# Patient Record
Sex: Male | Born: 1950 | Race: White | Hispanic: No | Marital: Married | Smoking: Never smoker
Health system: Southern US, Community
[De-identification: ages and names within clinical notes are randomized; demographics above are authoritative.]

## PROBLEM LIST (undated history)

## (undated) DIAGNOSIS — N419 Inflammatory disease of prostate, unspecified: Secondary | ICD-10-CM

## (undated) DIAGNOSIS — G4733 Obstructive sleep apnea (adult) (pediatric): Secondary | ICD-10-CM

## (undated) DIAGNOSIS — Z8669 Personal history of other diseases of the nervous system and sense organs: Secondary | ICD-10-CM

## (undated) DIAGNOSIS — D6859 Other primary thrombophilia: Secondary | ICD-10-CM

## (undated) DIAGNOSIS — L089 Local infection of the skin and subcutaneous tissue, unspecified: Secondary | ICD-10-CM

## (undated) DIAGNOSIS — M4810 Ankylosing hyperostosis [Forestier], site unspecified: Secondary | ICD-10-CM

## (undated) DIAGNOSIS — N400 Enlarged prostate without lower urinary tract symptoms: Secondary | ICD-10-CM

## (undated) DIAGNOSIS — I2699 Other pulmonary embolism without acute cor pulmonale: Secondary | ICD-10-CM

## (undated) DIAGNOSIS — D509 Iron deficiency anemia, unspecified: Secondary | ICD-10-CM

## (undated) DIAGNOSIS — R519 Headache, unspecified: Secondary | ICD-10-CM

## (undated) DIAGNOSIS — H9190 Unspecified hearing loss, unspecified ear: Secondary | ICD-10-CM

## (undated) DIAGNOSIS — N529 Male erectile dysfunction, unspecified: Secondary | ICD-10-CM

## (undated) DIAGNOSIS — I10 Essential (primary) hypertension: Secondary | ICD-10-CM

## (undated) DIAGNOSIS — R399 Unspecified symptoms and signs involving the genitourinary system: Secondary | ICD-10-CM

## (undated) DIAGNOSIS — T8859XA Other complications of anesthesia, initial encounter: Secondary | ICD-10-CM

## (undated) DIAGNOSIS — R51 Headache: Secondary | ICD-10-CM

## (undated) DIAGNOSIS — N3943 Post-void dribbling: Secondary | ICD-10-CM

## (undated) DIAGNOSIS — E291 Testicular hypofunction: Secondary | ICD-10-CM

## (undated) DIAGNOSIS — J40 Bronchitis, not specified as acute or chronic: Secondary | ICD-10-CM

## (undated) DIAGNOSIS — N3000 Acute cystitis without hematuria: Secondary | ICD-10-CM

## (undated) DIAGNOSIS — G5600 Carpal tunnel syndrome, unspecified upper limb: Secondary | ICD-10-CM

## (undated) DIAGNOSIS — R42 Dizziness and giddiness: Secondary | ICD-10-CM

## (undated) DIAGNOSIS — M48061 Spinal stenosis, lumbar region without neurogenic claudication: Secondary | ICD-10-CM

## (undated) DIAGNOSIS — M5136 Other intervertebral disc degeneration, lumbar region: Secondary | ICD-10-CM

## (undated) DIAGNOSIS — K219 Gastro-esophageal reflux disease without esophagitis: Secondary | ICD-10-CM

## (undated) DIAGNOSIS — E119 Type 2 diabetes mellitus without complications: Secondary | ICD-10-CM

## (undated) DIAGNOSIS — R0600 Dyspnea, unspecified: Secondary | ICD-10-CM

## (undated) DIAGNOSIS — D6851 Activated protein C resistance: Secondary | ICD-10-CM

## (undated) DIAGNOSIS — J309 Allergic rhinitis, unspecified: Secondary | ICD-10-CM

## (undated) DIAGNOSIS — Z86711 Personal history of pulmonary embolism: Secondary | ICD-10-CM

## (undated) DIAGNOSIS — G4730 Sleep apnea, unspecified: Secondary | ICD-10-CM

## (undated) DIAGNOSIS — G5710 Meralgia paresthetica, unspecified lower limb: Secondary | ICD-10-CM

## (undated) DIAGNOSIS — M542 Cervicalgia: Secondary | ICD-10-CM

## (undated) DIAGNOSIS — I509 Heart failure, unspecified: Secondary | ICD-10-CM

## (undated) DIAGNOSIS — M109 Gout, unspecified: Secondary | ICD-10-CM

## (undated) DIAGNOSIS — H905 Unspecified sensorineural hearing loss: Secondary | ICD-10-CM

## (undated) DIAGNOSIS — M51369 Other intervertebral disc degeneration, lumbar region without mention of lumbar back pain or lower extremity pain: Secondary | ICD-10-CM

## (undated) DIAGNOSIS — Z86718 Personal history of other venous thrombosis and embolism: Secondary | ICD-10-CM

## (undated) DIAGNOSIS — E7800 Pure hypercholesterolemia, unspecified: Secondary | ICD-10-CM

## (undated) DIAGNOSIS — T4145XA Adverse effect of unspecified anesthetic, initial encounter: Secondary | ICD-10-CM

## (undated) DIAGNOSIS — E669 Obesity, unspecified: Secondary | ICD-10-CM

## (undated) HISTORY — DX: Benign prostatic hyperplasia without lower urinary tract symptoms: N40.0

## (undated) HISTORY — DX: Personal history of other venous thrombosis and embolism: Z86.718

## (undated) HISTORY — DX: Post-void dribbling: N39.43

## (undated) HISTORY — DX: Inflammatory disease of prostate, unspecified: N41.9

## (undated) HISTORY — DX: Testicular hypofunction: E29.1

## (undated) HISTORY — PX: FOOT SURGERY: SHX648

## (undated) HISTORY — PX: TUMOR EXCISION: SHX421

## (undated) HISTORY — DX: Type 2 diabetes mellitus without complications: E11.9

## (undated) HISTORY — DX: Obesity, unspecified: E66.9

## (undated) HISTORY — DX: Male erectile dysfunction, unspecified: N52.9

## (undated) HISTORY — PX: JOINT REPLACEMENT: SHX530

## (undated) HISTORY — PX: INNER EAR SURGERY: SHX679

## (undated) HISTORY — PX: PROSTATE SURGERY: SHX751

## (undated) HISTORY — DX: Acute cystitis without hematuria: N30.00

## (undated) HISTORY — PX: OTHER SURGICAL HISTORY: SHX169

## (undated) HISTORY — DX: Local infection of the skin and subcutaneous tissue, unspecified: L08.9

## (undated) HISTORY — DX: Unspecified symptoms and signs involving the genitourinary system: R39.9

---
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
## 1972-05-10 HISTORY — PX: LEG SURGERY: SHX1003

## 1977-05-10 HISTORY — PX: VASECTOMY: SHX75

## 1982-05-10 HISTORY — PX: HEMORRHOID SURGERY: SHX153

## 1983-05-11 HISTORY — PX: HYDROCELE EXCISION / REPAIR: SUR1145

## 1983-05-11 HISTORY — PX: HERNIA REPAIR: SHX51

## 2001-05-10 HISTORY — PX: FOOT SURGERY: SHX648

## 2004-09-28 ENCOUNTER — Ambulatory Visit: Payer: Self-pay | Admitting: Internal Medicine

## 2005-02-11 ENCOUNTER — Ambulatory Visit: Payer: Self-pay | Admitting: Ophthalmology

## 2005-07-05 ENCOUNTER — Ambulatory Visit: Payer: Self-pay | Admitting: Gastroenterology

## 2005-07-12 ENCOUNTER — Ambulatory Visit: Payer: Self-pay | Admitting: Gastroenterology

## 2006-07-19 ENCOUNTER — Ambulatory Visit: Payer: Self-pay | Admitting: Chiropractic Medicine

## 2006-08-01 ENCOUNTER — Emergency Department: Payer: Self-pay | Admitting: Unknown Physician Specialty

## 2008-01-29 ENCOUNTER — Ambulatory Visit: Payer: Self-pay | Admitting: Internal Medicine

## 2009-05-10 HISTORY — PX: CARPAL TUNNEL RELEASE: SHX101

## 2009-11-17 ENCOUNTER — Ambulatory Visit: Payer: Self-pay | Admitting: Unknown Physician Specialty

## 2009-12-02 ENCOUNTER — Emergency Department: Payer: Self-pay | Admitting: Emergency Medicine

## 2009-12-02 ENCOUNTER — Ambulatory Visit: Payer: Self-pay | Admitting: Internal Medicine

## 2010-03-25 ENCOUNTER — Ambulatory Visit: Payer: Self-pay

## 2010-05-10 HISTORY — PX: REPLACEMENT TOTAL KNEE: SUR1224

## 2010-08-26 DIAGNOSIS — M1710 Unilateral primary osteoarthritis, unspecified knee: Secondary | ICD-10-CM | POA: Insufficient documentation

## 2011-07-05 DIAGNOSIS — M545 Low back pain, unspecified: Secondary | ICD-10-CM | POA: Insufficient documentation

## 2011-07-23 ENCOUNTER — Ambulatory Visit: Payer: Self-pay | Admitting: Rheumatology

## 2011-08-18 DIAGNOSIS — M1710 Unilateral primary osteoarthritis, unspecified knee: Secondary | ICD-10-CM | POA: Insufficient documentation

## 2013-04-09 ENCOUNTER — Ambulatory Visit: Payer: Self-pay | Admitting: Physical Medicine and Rehabilitation

## 2013-10-16 DIAGNOSIS — E1169 Type 2 diabetes mellitus with other specified complication: Secondary | ICD-10-CM | POA: Insufficient documentation

## 2013-10-16 DIAGNOSIS — I872 Venous insufficiency (chronic) (peripheral): Secondary | ICD-10-CM | POA: Insufficient documentation

## 2013-10-16 DIAGNOSIS — D500 Iron deficiency anemia secondary to blood loss (chronic): Secondary | ICD-10-CM | POA: Insufficient documentation

## 2013-10-16 DIAGNOSIS — Z7901 Long term (current) use of anticoagulants: Secondary | ICD-10-CM | POA: Insufficient documentation

## 2013-10-16 DIAGNOSIS — E119 Type 2 diabetes mellitus without complications: Secondary | ICD-10-CM | POA: Insufficient documentation

## 2013-10-16 DIAGNOSIS — M5136 Other intervertebral disc degeneration, lumbar region: Secondary | ICD-10-CM | POA: Insufficient documentation

## 2013-10-16 DIAGNOSIS — E7800 Pure hypercholesterolemia, unspecified: Secondary | ICD-10-CM | POA: Insufficient documentation

## 2013-10-16 DIAGNOSIS — J309 Allergic rhinitis, unspecified: Secondary | ICD-10-CM | POA: Insufficient documentation

## 2014-02-08 DIAGNOSIS — M5416 Radiculopathy, lumbar region: Secondary | ICD-10-CM | POA: Insufficient documentation

## 2014-05-07 ENCOUNTER — Ambulatory Visit: Payer: Self-pay | Admitting: Internal Medicine

## 2014-05-10 ENCOUNTER — Ambulatory Visit: Payer: Self-pay | Admitting: Internal Medicine

## 2014-06-10 ENCOUNTER — Ambulatory Visit: Payer: Self-pay | Admitting: Internal Medicine

## 2014-06-26 DIAGNOSIS — M48061 Spinal stenosis, lumbar region without neurogenic claudication: Secondary | ICD-10-CM | POA: Insufficient documentation

## 2014-08-19 ENCOUNTER — Ambulatory Visit
Admit: 2014-08-19 | Disposition: A | Discharge status: Home / Self Care | Payer: Self-pay | Attending: Urology | Admitting: Urology

## 2014-08-19 LAB — CBC
HCT: 46.9 % (ref 40.0–52.0)
HGB: 15.1 g/dL (ref 13.0–18.0)
MCH: 27.1 pg (ref 26.0–34.0)
MCHC: 32.2 g/dL (ref 32.0–36.0)
MCV: 84 fL (ref 80–100)
Platelet: 232 10*3/uL (ref 150–440)
RBC: 5.58 10*6/uL (ref 4.40–5.90)
RDW: 18.7 % — AB (ref 11.5–14.5)
WBC: 9.8 10*3/uL (ref 3.8–10.6)

## 2014-08-19 LAB — URINALYSIS, COMPLETE
BACTERIA: NONE SEEN
BILIRUBIN, UR: NEGATIVE
GLUCOSE, UR: NEGATIVE mg/dL (ref 0–75)
Ketone: NEGATIVE
Leukocyte Esterase: NEGATIVE
Nitrite: NEGATIVE
PH: 5 (ref 4.5–8.0)
Protein: NEGATIVE
Specific Gravity: 1.023 (ref 1.003–1.030)

## 2014-08-19 LAB — BASIC METABOLIC PANEL
Anion Gap: 5 — ABNORMAL LOW (ref 7–16)
BUN: 14 mg/dL
CHLORIDE: 103 mmol/L
CREATININE: 0.89 mg/dL
Calcium, Total: 9.1 mg/dL
Co2: 28 mmol/L
EGFR (African American): >60
EGFR (Non-African Amer.): >60
GLUCOSE: 92 mg/dL
POTASSIUM: 3.8 mmol/L
Sodium: 136 mmol/L

## 2014-08-21 LAB — URINE CULTURE

## 2014-08-28 ENCOUNTER — Ambulatory Visit
Admit: 2014-08-28 | Disposition: A | Discharge status: Home / Self Care | Payer: Self-pay | Attending: Urology | Admitting: Urology

## 2014-08-28 HISTORY — PX: HOLEP-LASER ENUCLEATION OF THE PROSTATE WITH MORCELLATION: SHX6641

## 2014-08-28 LAB — PROTIME-INR
INR: 1
PROTHROMBIN TIME: 13.2 s

## 2014-09-02 LAB — SURGICAL PATHOLOGY

## 2014-09-08 NOTE — Op Note (Signed)
PATIENT NAME:  Lance [name], Lance [name] MR#:  [id number] DATE OF BIRTH:  1950-09-05  DATE OF PROCEDURE:  08/28/2014  PREOPERATIVE DIAGNOSIS: Benign prostatic hypertrophy.   POSTOPERATIVE DIAGNOSIS: Benign prostatic hypertrophy.   PROCEDURE PERFORMED: Holmium laser enucleation of the prostate with morcellation.   ANESTHESIA: General anesthesia.   ATTENDING SURGEON:  Sherlynn Stalls, MD   ESTIMATED BLOOD LOSS: minmal  DRAINS:  A 20 French 2-way Foley catheter with 15 mL in the balloon.   SPECIMENS: Prostate chips.   COMPLICATIONS: None.   INDICATION: This is a 64 year old male with worsening benign prostatic hypertrophy symptoms. He was counseled on his various treatment options. He has elected to undergo holmium laser enucleation of his prostate, risks and benefits of the procedure were explained in detail. The patient agreed to this plan.   PROCEDURE: The patient was clearly identified in the preoperative holding area and informed consent was confirmed. He was brought to operating theater, placed on the table in a supine position. At this time universal timeout protocol was performed. All team members were identified. Venodyne boots were placed on his right leg, but not the left. He was administered 500 mg of IV Levaquin in the perioperative period. He was then placed under general anesthesia, repositioned low on the bed in the dorsal lithotomy position and prepped and draped in standard surgical fashion.   At this point in time, a 60 French resectoscope was advanced per urethra into the bladder using the nonvisual obturator, this advanced without difficulty. The bladder was then surveyed using the lens and this revealed a somewhat trabeculated bladder with a diverticulum present. The prostatic fossa was noted to have significant bilobar coaptation with a fairly small median lobe component. The trigone was noted to be significantly away from the bladder neck, which was slightly elevated.   At  this point in time, the laser bridge was brought in and using a 550 micron laser fiber on the settings of 2 joules and 40 Hz, two incisions made approximately at the 5 o'clock and 7 o'clock positions. The incision was carried down from the bladder neck to just proximal to the  verumontanum where the 2 incisions met in the midline. The small median lobe was then enucleated creating a plane between the median lobe and the capsule starting at the apex rolling the median lobe towards the bladder until it was amputated from the bladder neck. At this portion of the procedure, the energy was increased to 2 joules and 50 Hz.   Next, attention was then turned to the enucleation of the left lateral lobe. An incision was carried up at the prostatic apex but again care taken to avoid any incisions beyond the verumontanum. Again a plane was created between the capsule and the adenoma working from distally to proximally. Once the lobe hung only from the bladder neck, an incision in the anterior commissure was created to attempt to connect these 2 incisions. Unfortunately, I had some difficulty completely amputating this lobe at this point in time, so the decision was made to proceed with enucleation of the right lobe and connect the 2 pieces and enucleate the remaining prostatic adenoma and block.    The same exact procedure was used creating an incision at the apex of the prostate proximal to the verumontanum on the right hand side and enucleating from a distal to proximal direction. At this point in time, the lobe was hanging only from the bladder neck. Once this was amputated, the entire left and  right lobes were freed up into bladder. A few small remaining BPH nodules were piecewise enucleated until the fossa was widely patent. There was minimal bleeding. A few small bleeders were controlled using the settings of 1.5 joules and 30 Hz, for hemostasis.   Next, leaving the external sheath of the resectoscope and bringing  the nephroscope lens in, the VersaTack morcellator was then brought into the bladder. A second inflow was created to adequately distend the bladder in order to avoid any injury to the bladder wall itself. The pieces of the adenoma were identified and carefully morcellated.   Once all the pieces were captured and removed, the bladder was drained and then carefully reinspected. A few small adenoma pieces were grasped using the graspers and removed through the sheath. At this point, the bladder was clear of any residual pieces. The scope was then removed at the point. A 20 French 2-way Foley catheter was advanced over a catheter guide into the bladder. It was then filled with 50 mL of sterile water. The bladder was irrigated to confirm the position within the bladder, which was deemed appropriate. He was then reversed from anesthesia and taken to the PACU in stable condition. There were no complications in this case.   PLAN: The patient will remove his own catheter tomorrow as long as his urine remains relatively clear. Of note, he was given 10 mg of IV Lasix at the end of the case to help facilitate diuresis. Encouraged to drink plenty of p.o. He will follow up in 4 to 6 weeks for an uroflow post-void residual.    ____________________________ Sherlynn Stalls, MD ajb:nt D: 08/29/2014 11:30:54 ET T: 08/29/2014 14:02:10 ET JOB#: [id number]  cc: Sherlynn Stalls, MD, <Dictator> Sherlynn Stalls MD ELECTRONICALLY SIGNED 09/05/2014 16:23

## 2014-09-18 DIAGNOSIS — Z96611 Presence of right artificial shoulder joint: Secondary | ICD-10-CM | POA: Insufficient documentation

## 2014-09-18 HISTORY — PX: TOTAL SHOULDER ARTHROPLASTY: SHX126

## 2014-09-19 DIAGNOSIS — D6859 Other primary thrombophilia: Secondary | ICD-10-CM | POA: Insufficient documentation

## 2014-11-07 ENCOUNTER — Other Ambulatory Visit: Payer: Medicare Other

## 2014-11-07 DIAGNOSIS — E291 Testicular hypofunction: Secondary | ICD-10-CM

## 2014-11-08 ENCOUNTER — Telehealth: Payer: Self-pay

## 2014-11-08 LAB — TESTOSTERONE: TESTOSTERONE: 326 ng/dL — AB (ref 348–1197)

## 2014-11-08 NOTE — Telephone Encounter (Signed)
Spoke with pt in reference to testopel. Pt asked if he needs to be approved. Please advise.

## 2014-11-08 NOTE — Telephone Encounter (Signed)
-----   Message from Nori Riis, PA-C sent at 11/08/2014  8:45 AM EDT ----- Patient's testosterone is low.  He can schedule Testopel.

## 2014-11-10 NOTE — Telephone Encounter (Signed)
He was approved in February.

## 2014-12-05 ENCOUNTER — Ambulatory Visit: Payer: Medicare Other | Admitting: Urology

## 2014-12-12 DIAGNOSIS — Z96652 Presence of left artificial knee joint: Secondary | ICD-10-CM | POA: Insufficient documentation

## 2014-12-24 DIAGNOSIS — M2041 Other hammer toe(s) (acquired), right foot: Secondary | ICD-10-CM | POA: Insufficient documentation

## 2014-12-24 DIAGNOSIS — I872 Venous insufficiency (chronic) (peripheral): Secondary | ICD-10-CM | POA: Insufficient documentation

## 2014-12-31 ENCOUNTER — Encounter: Payer: Self-pay | Admitting: *Deleted

## 2014-12-31 ENCOUNTER — Other Ambulatory Visit: Payer: Self-pay | Admitting: *Deleted

## 2015-01-07 ENCOUNTER — Encounter: Payer: Self-pay | Admitting: Urology

## 2015-01-07 ENCOUNTER — Ambulatory Visit (INDEPENDENT_AMBULATORY_CARE_PROVIDER_SITE_OTHER): Payer: Medicare Other | Admitting: Urology

## 2015-01-07 VITALS — BP 107/64 | HR 57 | Resp 18 | Ht 73.0 in | Wt 292.1 lb

## 2015-01-07 DIAGNOSIS — E291 Testicular hypofunction: Secondary | ICD-10-CM

## 2015-01-07 MED ORDER — TESTOSTERONE 75 MG IL PLLT
75.0000 mg | PELLET | Freq: Once | Status: AC
  Administered 2015-01-07: 75 mg

## 2015-01-07 MED ORDER — LIDOCAINE-EPINEPHRINE 1 %-1:100000 IJ SOLN
10.0000 mL | Freq: Once | INTRAMUSCULAR | Status: AC
  Administered 2015-01-07: 10 mL via INTRADERMAL

## 2015-01-07 NOTE — Progress Notes (Signed)
This is a 64 -year-old male with hypogonadism and he is managed with Testopel. He presents today for Testopel insertion.  Patient is placed on the exam table in the left lateral jackknife position.  Identified upper outer quadrant of hip for insertion; prepped area with 15 and injected 10 cc's of Lidocaine 1% with Epinephrine to anesthetize superficially and distally along trocar tract.  Made 3 mm incision using 10 blade of scalpel; trocar with sharp ended stylet was inserted into subcutaneous tissue in line with femur. Sharp stylet was withdrawn and 6 pellets were placed into trocar well. Testopel pellets advanced into tissue using blunt ended stylet. Trocar removed and incision closed using 6 Steri-Strips. Cleansed area to remove Betadine and covered Steri-Strips with outer Band-Aid.  Careful inspection of insertion is done and patient informed of post procedure instructions.  He will return in three month for serum testosterone before 9:00am, HCT, PSA and DRE.  We will need to remind him to fill out another Testopel form in 05/2015.

## 2015-01-20 ENCOUNTER — Ambulatory Visit: Payer: Medicare Other | Admitting: Urology

## 2015-01-24 ENCOUNTER — Encounter: Payer: Self-pay | Admitting: Dietician

## 2015-04-09 ENCOUNTER — Ambulatory Visit: Payer: Medicare Other | Admitting: Urology

## 2015-04-10 ENCOUNTER — Ambulatory Visit (INDEPENDENT_AMBULATORY_CARE_PROVIDER_SITE_OTHER): Payer: Medicare Other | Admitting: Urology

## 2015-04-10 ENCOUNTER — Encounter: Payer: Self-pay | Admitting: Urology

## 2015-04-10 VITALS — BP 108/64 | HR 71 | Ht 72.0 in | Wt 292.2 lb

## 2015-04-10 DIAGNOSIS — E785 Hyperlipidemia, unspecified: Secondary | ICD-10-CM

## 2015-04-10 DIAGNOSIS — N401 Enlarged prostate with lower urinary tract symptoms: Secondary | ICD-10-CM

## 2015-04-10 DIAGNOSIS — E291 Testicular hypofunction: Secondary | ICD-10-CM

## 2015-04-10 DIAGNOSIS — N529 Male erectile dysfunction, unspecified: Secondary | ICD-10-CM

## 2015-04-10 DIAGNOSIS — N528 Other male erectile dysfunction: Secondary | ICD-10-CM | POA: Diagnosis not present

## 2015-04-10 DIAGNOSIS — N138 Other obstructive and reflux uropathy: Secondary | ICD-10-CM | POA: Insufficient documentation

## 2015-04-10 MED ORDER — TAMSULOSIN HCL 0.4 MG PO CAPS: 0.4000 mg | ORAL_CAPSULE | Freq: Every day | ORAL | Status: DC

## 2015-04-10 NOTE — Progress Notes (Signed)
04/10/2015 9:16 AM   Star Age 07/30/1950 [id number]  Referring provider: Ezequiel Kayser, MD [facility] [place], [place] [address]  Chief Complaint  Patient presents with  . Hypogonadism    3 month recheck  . Benign Prostatic Hypertrophy    HPI: Patient is a 64 year old white male with hypogonadism, erectile dysfunction and BPH with LUTS who presents today for 3 month follow-up.  Hypogonadism Patient presented with  the symptoms of reduced libido, erectile dysfunction and hot flashes.   He is also experiencing a reduced incidence of spontaneous erections,  an increase of body fat, a decrease in physical and work performance, disturbances in sleep patterns, decreased energy and motivation, a decrease in cognitive function and mood changes.  This is indicated by his responses to the ADAM questionnaire.  He is currently managing his hypogonadism with Testopel insertions.  His last being in 12/2014.          Androgen Deficiency in the Aging Male      04/10/15 0800       Androgen Deficiency in the Aging Male   Do you have a decrease in libido (sex drive) Yes     Do you have lack of energy Yes     Do you have a decrease in strength and/or endurance Yes     Have you lost height Yes     Have you noticed a decreased "enjoyment of life" Yes     Are you sad and/or grumpy No     Are your erections less strong Yes     Have you noticed a recent deterioration in your ability to play sports No     Are you falling asleep after dinner Yes     Has there been a recent deterioration in your work performance No       BPH WITH LUTS His IPSS score today is 15, which is moderate lower urinary tract symptomatology. He is mostly satisfied with his quality life due to his urinary symptoms.  His major complaint today voiding with a double stream.  He has had these symptoms for the last year.  He denies any dysuria, hematuria or suprapubic pain.   He was taking Cialis  5 mg daily, but he had to discontinue the medication due to lack of insurance coverage.  His has had HoLEP on 08/28/2014.  He also denies any recent fevers, chills, nausea or vomiting.   He does not have a family history of PCa.      IPSS      04/10/15 0800       International Prostate Symptom Score   How often have you had the sensation of not emptying your bladder? Less than 1 in 5     How often have you had to urinate less than every two hours? More than half the time     How often have you found you stopped and started again several times when you urinated? Almost always     How often have you found it difficult to postpone urination? Less than half the time     How often have you had a weak urinary stream? Less than 1 in 5 times     How often have you had to strain to start urination? Less than 1 in 5 times     How many times did you typically get up at night to urinate? 1 Time     Total IPSS Score 15  Quality of Life due to urinary symptoms   If you were to spend the rest of your life with your urinary condition just the way it is now how would you feel about that? Mostly Satisfied        Score:  1-7 Mild 8-19 Moderate 20-35 Severe  Erectile dysfunction His SHIM score is 12, which is mild-moderate ED.   He has been having difficulty with erections for several years.     His major complaint is lack of ejaculation fluid and lack of orgasm.  His libido is preserved.   His risk factors for ED are DM, BPH and anticoagulation therapy.  He denies any painful erections or curvatures with his erections.   He has tried PDE5-inhibitors in the past, but he finds them cost prohibitive.          SHIM      04/10/15 0842       SHIM: Over the last 6 months:   How do you rate your confidence that you could get and keep an erection? Low     When you had erections with sexual stimulation, how often were your erections hard enough for penetration (entering your partner)? A Few Times (much  less than half the time)     During sexual intercourse, how often were you able to maintain your erection after you had penetrated (entered) your partner? Slightly Difficult     During sexual intercourse, how difficult was it to maintain your erection to completion of intercourse? Difficult     When you attempted sexual intercourse, how often was it satisfactory for you? Extremely Difficult     SHIM Total Score   SHIM 12        Score: 1-7 Severe ED 8-11 Moderate ED 12-16 Mild-Moderate ED 17-21 Mild ED 22-25 No ED        PMH: Past Medical History  Diagnosis Date  . Diabetes mellitus ([place])   . BPH (benign prostatic hyperplasia)   . Lower urinary tract symptoms   . Acute cystitis   . Hypogonadism in male   . Erectile dysfunction   . Obesity   . Prostatitis   . History of DVT (deep vein thrombosis)   . Post-void dribbling   . Infection of skin     Surgical History: Past Surgical History  Procedure Laterality Date  . Replacement total knee  2012  . Hernia repair    . Foot surgery Bilateral   . Blood vessel surgery    . Hydrocelectomy      x 2    Home Medications:    Medication List       This list is accurate as of: 04/10/15  9:16 AM.  Always use your most recent med list.               B-D ULTRAFINE III SHORT PEN 31G X 8 MM Misc  Generic drug:  Insulin Pen Needle  once daily.     CIALIS 5 MG tablet  Generic drug:  tadalafil  Take by mouth.     cyclobenzaprine 10 MG tablet  Commonly known as:  FLEXERIL  Take by mouth.     fluticasone 50 MCG/ACT nasal spray  Commonly known as:  FLONASE  1 spray by Each Nare route daily as needed for rhinitis.     HYDROcodone-acetaminophen 5-325 MG tablet  Commonly known as:  NORCO/VICODIN  5-325 tablets. 1/2 to 1 tablet 3 times a day     lansoprazole 30 MG  capsule  Commonly known as:  PREVACID  Take 30 mg by mouth.     lisinopril-hydrochlorothiazide 20-12.5 MG tablet  Commonly known as:   PRINZIDE,ZESTORETIC  Take by mouth.     MULTI-VITAMINS Tabs  Take by mouth.     NEURONTIN 300 MG capsule  Generic drug:  gabapentin  Take 300 mg by mouth.     ONE TOUCH ULTRA TEST test strip  Generic drug:  glucose blood  Use 2 (two) times daily. Use as instructed.     ONETOUCH DELICA LANCETS 99991111 Misc     pantoprazole 40 MG tablet  Commonly known as:  PROTONIX     PROAIR HFA 108 (90 BASE) MCG/ACT inhaler  Generic drug:  albuterol  Inhale into the lungs.     tamsulosin 0.4 MG Caps capsule  Commonly known as:  FLOMAX  Take 1 capsule (0.4 mg total) by mouth daily.     VICTOZA 18 MG/3ML Sopn  Generic drug:  Liraglutide  Inject into the skin.     warfarin 5 MG tablet  Commonly known as:  COUMADIN  Take by mouth.        Allergies:  Allergies  Allergen Reactions  . Penicillins     Other reaction(s): UNKNOWN    Family History: Family History  Problem Relation Age of Onset  . Thrombosis    . Skin cancer Father   . Kidney disease Neg Hx   . Prostate cancer Neg Hx     Social History:  reports that he has never smoked. He does not have any smokeless tobacco history on file. He reports that he does not drink alcohol or use illicit drugs.  ROS: UROLOGY Frequent Urination?: No Hard to postpone urination?: No Burning/pain with urination?: No Get up at night to urinate?: No Leakage of urine?: No Urine stream starts and stops?: No Trouble starting stream?: No Do you have to strain to urinate?: No Blood in urine?: No Urinary tract infection?: No Sexually transmitted disease?: No Injury to kidneys or bladder?: No Painful intercourse?: No Weak stream?: No Erection problems?: Yes Penile pain?: No  Gastrointestinal Nausea?: No Vomiting?: No Indigestion/heartburn?: No Diarrhea?: No Constipation?: Yes  Constitutional Fever: No Night sweats?: No Weight loss?: No Fatigue?: No  Skin Skin rash/lesions?: No Itching?: No  Eyes Blurred vision?: No Double  vision?: No  Ears/Nose/Throat Sore throat?: No Sinus problems?: No  Hematologic/Lymphatic Swollen glands?: No Easy bruising?: No  Cardiovascular Leg swelling?: Yes Chest pain?: No  Respiratory Cough?: No Shortness of breath?: No  Endocrine Excessive thirst?: No  Musculoskeletal Back pain?: Yes Joint pain?: No  Neurological Headaches?: No Dizziness?: No  Psychologic Depression?: No Anxiety?: No  Physical Exam: BP 108/64 mmHg  Pulse 71  Ht 6' (1.829 m)  Wt 292 lb 3.2 oz (132.541 kg)  BMI 39.62 kg/m2  GU: No CVA tenderness.  No bladder fullness or masses.  Patient with circumcised phallus.  Urethral meatus is patent.  No penile discharge. No penile lesions or rashes. Scrotum without lesions, cysts, rashes and/or edema.  Testicles are located scrotally bilaterally. No masses are appreciated in the testicles. Left and right epididymis are normal. Rectal: Patient with  normal sphincter tone. Anus and perineum without scarring or rashes. No rectal masses are appreciated. Prostate is approximately 50 grams, no nodules are appreciated. Seminal vesicles are normal.   Laboratory Data: Lab Results  Component Value Date   WBC 9.8 08/19/2014   HGB 15.1 08/19/2014   HCT 46.9 08/19/2014   MCV 84  08/19/2014   PLT 232 08/19/2014    Lab Results  Component Value Date   CREATININE 0.89 08/19/2014   PSA History  0.3 ng/mL on 06/04/2013  0.3 ng/mL on 12/31/2013  0.3 ng/mL on 07/02/2014     Lab Results  Component Value Date   TESTOSTERONE 326* 11/07/2014   Assessment & Plan:    1. Hypogonadism in male:   Patient's last Testopel insertion was on 01/07/2015.  He is interested in continuing the insertions. If his labs return within normal values, he will have his insertion next week.  - TSH - Hepatic function panel  2. BPH (benign prostatic hyperplasia) with LUTS:   IPSS score is 15/2.  He is s/p HoLEP on 08/28/2014.  Pathology benign.   Patient is no longer taking  Cialis 5 mg daily. He finds it cost prohibitive and his insurance will not cover the medication.   He had been on tamsulosin 0.4 mg in the past with good control of his urinary symptoms. He had discontinued it due to ejaculatory disorders. We will reinstate that medication. I have sent a prescription for the tamsulosin 0.4 mg daily to his pharmacy.  He will return in 6 months for I PSS score, exam and PSA.  3. Erectile dysfunction:   SHIM score is 12.  Patient has sildenafil 20 mg available to him. He states that sex is not as enjoyable since he cannot ejaculate.  He states he also has difficulty achieving orgasm.  I encouraged him to continue the sildenafil.  He will return to the clinic in 6 months for SHIM score and exam.    Return in about 1 week (around 04/17/2015) for Testopel insertion.  Zara Council, [facility] [address], [place], [place] [address] [phone]

## 2015-04-11 LAB — HEPATIC FUNCTION PANEL
ALT: 29 IU/L (ref 0–44)
AST: 26 IU/L (ref 0–40)
Albumin: 3.6 g/dL (ref 3.6–4.8)
Alkaline Phosphatase: 79 IU/L (ref 39–117)
Bilirubin Total: 0.4 mg/dL (ref 0.0–1.2)
Bilirubin, Direct: 0.12 mg/dL (ref 0.00–0.40)
Total Protein: 6.9 g/dL (ref 6.0–8.5)

## 2015-04-11 LAB — TSH: TSH: 0.872 u[IU]/mL (ref 0.450–4.500)

## 2015-04-11 LAB — PSA: Prostate Specific Ag, Serum: 0.5 ng/mL (ref 0.0–4.0)

## 2015-04-14 ENCOUNTER — Telehealth: Payer: Self-pay

## 2015-04-14 NOTE — Telephone Encounter (Signed)
Called [facility] and added a Testosterone value onto patients bloodwork. Joe at [facility] added test.

## 2015-04-14 NOTE — Telephone Encounter (Signed)
-----   Message from Nori Riis, PA-C sent at 04/11/2015  2:13 PM EST ----- Would you add a testosterone level to his blood work?

## 2015-04-15 LAB — TESTOSTERONE: Testosterone: 283 ng/dL — ABNORMAL LOW (ref 348–1197)

## 2015-04-17 ENCOUNTER — Ambulatory Visit (INDEPENDENT_AMBULATORY_CARE_PROVIDER_SITE_OTHER): Payer: Medicare Other | Admitting: Urology

## 2015-04-17 ENCOUNTER — Encounter: Payer: Self-pay | Admitting: Urology

## 2015-04-17 VITALS — Ht 73.0 in | Wt 291.4 lb

## 2015-04-17 DIAGNOSIS — E291 Testicular hypofunction: Secondary | ICD-10-CM

## 2015-04-17 MED ORDER — TESTOSTERONE 75 MG IL PLLT
75.0000 mg | PELLET | Freq: Once | Status: AC
  Administered 2015-04-17: 75 mg

## 2015-04-17 NOTE — Progress Notes (Signed)
TESTOPEL INSERTION  This is a 64 -year-old male with hypogonadism and he is managed with Testopel. He presents today for Testopel insertion.  Patient is placed on the exam table in the right lateral jackknife position.  Identified upper outer quadrant of hip for insertion; prepped area with Betadine and injected 10 cc's of Lidocaine 1% with Epinephrine to anesthetize superficially and distally along trocar tract.  Made 3 mm incision using 15 blade of scalpel; trocar with sharp ended stylet was inserted into subcutaneous tissue in line with femur. Sharp stylet was withdrawn and 6 pellets were placed into trocar well. Testopel pellets advanced into tissue using blunt ended stylet. Trocar removed and incision closed using 6 Steri-Strips. Cleansed area to remove Betadine and covered Steri-Strips with outer Band-Aid.  Careful inspection of insertion is done and patient informed of post procedure instructions.  He will return in three month for serum testosterone and HCT.

## 2015-04-18 LAB — SPECIMEN STATUS REPORT

## 2015-06-17 ENCOUNTER — Telehealth: Payer: Self-pay | Admitting: *Deleted

## 2015-06-17 NOTE — Telephone Encounter (Signed)
Spoke with patient about booking an appointment for his next Testopel. I have just received his authorization and approval. Patient states he does not think it is helping. I asked if he wanted to wait until his March 8th appointment with Larene Beach to discuss this and if he needs an appointment for it after this we can schedule it after his appointment this day. Patient states that will be fine.

## 2015-07-10 ENCOUNTER — Emergency Department: Payer: Medicare Other

## 2015-07-10 ENCOUNTER — Encounter: Payer: Self-pay | Admitting: *Deleted

## 2015-07-10 ENCOUNTER — Encounter: Payer: Self-pay | Admitting: Emergency Medicine

## 2015-07-10 ENCOUNTER — Emergency Department
Admission: EM | Admit: 2015-07-10 | Discharge: 2015-07-10 | Disposition: A | Discharge status: Home / Self Care | Payer: Medicare Other | Attending: Emergency Medicine | Admitting: Emergency Medicine

## 2015-07-10 DIAGNOSIS — Z794 Long term (current) use of insulin: Secondary | ICD-10-CM | POA: Diagnosis not present

## 2015-07-10 DIAGNOSIS — S7011XA Contusion of right thigh, initial encounter: Secondary | ICD-10-CM | POA: Diagnosis not present

## 2015-07-10 DIAGNOSIS — S92531A Displaced fracture of distal phalanx of right lesser toe(s), initial encounter for closed fracture: Secondary | ICD-10-CM | POA: Insufficient documentation

## 2015-07-10 DIAGNOSIS — S99921A Unspecified injury of right foot, initial encounter: Secondary | ICD-10-CM | POA: Diagnosis present

## 2015-07-10 DIAGNOSIS — Y998 Other external cause status: Secondary | ICD-10-CM | POA: Diagnosis not present

## 2015-07-10 DIAGNOSIS — Y9389 Activity, other specified: Secondary | ICD-10-CM | POA: Insufficient documentation

## 2015-07-10 DIAGNOSIS — Y92410 Unspecified street and highway as the place of occurrence of the external cause: Secondary | ICD-10-CM | POA: Insufficient documentation

## 2015-07-10 DIAGNOSIS — Z7951 Long term (current) use of inhaled steroids: Secondary | ICD-10-CM | POA: Diagnosis not present

## 2015-07-10 DIAGNOSIS — I10 Essential (primary) hypertension: Secondary | ICD-10-CM | POA: Insufficient documentation

## 2015-07-10 DIAGNOSIS — Z880 Allergy status to penicillin: Secondary | ICD-10-CM | POA: Diagnosis not present

## 2015-07-10 DIAGNOSIS — D649 Anemia, unspecified: Secondary | ICD-10-CM | POA: Insufficient documentation

## 2015-07-10 DIAGNOSIS — Z7901 Long term (current) use of anticoagulants: Secondary | ICD-10-CM | POA: Insufficient documentation

## 2015-07-10 DIAGNOSIS — Z79899 Other long term (current) drug therapy: Secondary | ICD-10-CM | POA: Insufficient documentation

## 2015-07-10 DIAGNOSIS — S70311A Abrasion, right thigh, initial encounter: Secondary | ICD-10-CM | POA: Diagnosis not present

## 2015-07-10 DIAGNOSIS — E785 Hyperlipidemia, unspecified: Secondary | ICD-10-CM | POA: Diagnosis not present

## 2015-07-10 DIAGNOSIS — S92911A Unspecified fracture of right toe(s), initial encounter for closed fracture: Secondary | ICD-10-CM

## 2015-07-10 DIAGNOSIS — E119 Type 2 diabetes mellitus without complications: Secondary | ICD-10-CM | POA: Diagnosis not present

## 2015-07-10 MED ORDER — BACITRACIN ZINC 500 UNIT/GM EX OINT
TOPICAL_OINTMENT | CUTANEOUS | Status: AC
  Filled 2015-07-10: qty 1.8

## 2015-07-10 MED ORDER — BACITRACIN ZINC 500 UNIT/GM EX OINT
TOPICAL_OINTMENT | Freq: Two times a day (BID) | CUTANEOUS | Status: DC
  Administered 2015-07-10: 17:00:00 via TOPICAL
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
## 2015-07-10 NOTE — Discharge Instructions (Signed)
Wear open shoe for 2-3 weeks as needed. Continue previous medications.

## 2015-07-11 ENCOUNTER — Encounter
Admission: RE | Disposition: A | Discharge status: Home Health | Payer: Self-pay | Source: Ambulatory Visit | Attending: Gastroenterology

## 2015-07-11 ENCOUNTER — Ambulatory Visit
Admission: RE | Admit: 2015-07-11 | Discharge: 2015-07-11 | Disposition: A | Discharge status: Home Health | Payer: Medicare Other | Source: Ambulatory Visit | Attending: Gastroenterology | Admitting: Gastroenterology

## 2015-07-11 ENCOUNTER — Ambulatory Visit: Payer: Medicare Other | Admitting: Anesthesiology

## 2015-07-11 ENCOUNTER — Encounter: Payer: Self-pay | Admitting: Anesthesiology

## 2015-07-11 DIAGNOSIS — E669 Obesity, unspecified: Secondary | ICD-10-CM | POA: Insufficient documentation

## 2015-07-11 DIAGNOSIS — G4730 Sleep apnea, unspecified: Secondary | ICD-10-CM | POA: Diagnosis not present

## 2015-07-11 DIAGNOSIS — Z1211 Encounter for screening for malignant neoplasm of colon: Secondary | ICD-10-CM | POA: Diagnosis present

## 2015-07-11 DIAGNOSIS — G5600 Carpal tunnel syndrome, unspecified upper limb: Secondary | ICD-10-CM | POA: Insufficient documentation

## 2015-07-11 DIAGNOSIS — N529 Male erectile dysfunction, unspecified: Secondary | ICD-10-CM | POA: Diagnosis not present

## 2015-07-11 DIAGNOSIS — I10 Essential (primary) hypertension: Secondary | ICD-10-CM | POA: Diagnosis not present

## 2015-07-11 DIAGNOSIS — M4806 Spinal stenosis, lumbar region: Secondary | ICD-10-CM | POA: Insufficient documentation

## 2015-07-11 DIAGNOSIS — H905 Unspecified sensorineural hearing loss: Secondary | ICD-10-CM | POA: Diagnosis not present

## 2015-07-11 DIAGNOSIS — K219 Gastro-esophageal reflux disease without esophagitis: Secondary | ICD-10-CM | POA: Diagnosis not present

## 2015-07-11 DIAGNOSIS — D123 Benign neoplasm of transverse colon: Secondary | ICD-10-CM | POA: Diagnosis not present

## 2015-07-11 DIAGNOSIS — I509 Heart failure, unspecified: Secondary | ICD-10-CM | POA: Diagnosis not present

## 2015-07-11 DIAGNOSIS — D6859 Other primary thrombophilia: Secondary | ICD-10-CM | POA: Diagnosis not present

## 2015-07-11 DIAGNOSIS — M5136 Other intervertebral disc degeneration, lumbar region: Secondary | ICD-10-CM | POA: Insufficient documentation

## 2015-07-11 DIAGNOSIS — K640 First degree hemorrhoids: Secondary | ICD-10-CM | POA: Insufficient documentation

## 2015-07-11 DIAGNOSIS — Z794 Long term (current) use of insulin: Secondary | ICD-10-CM | POA: Insufficient documentation

## 2015-07-11 DIAGNOSIS — Z7951 Long term (current) use of inhaled steroids: Secondary | ICD-10-CM | POA: Insufficient documentation

## 2015-07-11 DIAGNOSIS — Z86711 Personal history of pulmonary embolism: Secondary | ICD-10-CM | POA: Diagnosis not present

## 2015-07-11 DIAGNOSIS — N400 Enlarged prostate without lower urinary tract symptoms: Secondary | ICD-10-CM | POA: Insufficient documentation

## 2015-07-11 DIAGNOSIS — E7800 Pure hypercholesterolemia, unspecified: Secondary | ICD-10-CM | POA: Insufficient documentation

## 2015-07-11 DIAGNOSIS — Z7901 Long term (current) use of anticoagulants: Secondary | ICD-10-CM | POA: Diagnosis not present

## 2015-07-11 DIAGNOSIS — G5710 Meralgia paresthetica, unspecified lower limb: Secondary | ICD-10-CM | POA: Diagnosis not present

## 2015-07-11 DIAGNOSIS — Z86010 Personal history of colonic polyps: Secondary | ICD-10-CM | POA: Insufficient documentation

## 2015-07-11 DIAGNOSIS — E119 Type 2 diabetes mellitus without complications: Secondary | ICD-10-CM | POA: Diagnosis not present

## 2015-07-11 DIAGNOSIS — J309 Allergic rhinitis, unspecified: Secondary | ICD-10-CM | POA: Insufficient documentation

## 2015-07-11 DIAGNOSIS — D120 Benign neoplasm of cecum: Secondary | ICD-10-CM | POA: Insufficient documentation

## 2015-07-11 DIAGNOSIS — Z79899 Other long term (current) drug therapy: Secondary | ICD-10-CM | POA: Diagnosis not present

## 2015-07-11 DIAGNOSIS — M109 Gout, unspecified: Secondary | ICD-10-CM | POA: Diagnosis not present

## 2015-07-11 DIAGNOSIS — Z86718 Personal history of other venous thrombosis and embolism: Secondary | ICD-10-CM | POA: Diagnosis not present

## 2015-07-11 DIAGNOSIS — Z880 Allergy status to penicillin: Secondary | ICD-10-CM | POA: Diagnosis not present

## 2015-07-11 HISTORY — DX: Spinal stenosis, lumbar region without neurogenic claudication: M48.061

## 2015-07-11 HISTORY — DX: Personal history of pulmonary embolism: Z86.711

## 2015-07-11 HISTORY — DX: Personal history of other diseases of the nervous system and sense organs: Z86.69

## 2015-07-11 HISTORY — DX: Unspecified sensorineural hearing loss: H90.5

## 2015-07-11 HISTORY — DX: Other complications of anesthesia, initial encounter: T88.59XA

## 2015-07-11 HISTORY — DX: Adverse effect of unspecified anesthetic, initial encounter: T41.45XA

## 2015-07-11 HISTORY — DX: Gastro-esophageal reflux disease without esophagitis: K21.9

## 2015-07-11 HISTORY — PX: COLONOSCOPY WITH PROPOFOL: SHX5780

## 2015-07-11 HISTORY — DX: Meralgia paresthetica, unspecified lower limb: G57.10

## 2015-07-11 HISTORY — DX: Gout, unspecified: M10.9

## 2015-07-11 HISTORY — DX: Pure hypercholesterolemia, unspecified: E78.00

## 2015-07-11 HISTORY — DX: Other primary thrombophilia: D68.59

## 2015-07-11 HISTORY — DX: Iron deficiency anemia, unspecified: D50.9

## 2015-07-11 HISTORY — DX: Sleep apnea, unspecified: G47.30

## 2015-07-11 HISTORY — DX: Other intervertebral disc degeneration, lumbar region: M51.36

## 2015-07-11 HISTORY — DX: Allergic rhinitis, unspecified: J30.9

## 2015-07-11 HISTORY — DX: Other intervertebral disc degeneration, lumbar region without mention of lumbar back pain or lower extremity pain: M51.369

## 2015-07-11 HISTORY — DX: Carpal tunnel syndrome, unspecified upper limb: G56.00

## 2015-07-11 HISTORY — DX: Essential (primary) hypertension: I10

## 2015-07-11 HISTORY — DX: Heart failure, unspecified: I50.9

## 2015-07-11 LAB — CBC
HEMATOCRIT: 45.9 % (ref 40.0–52.0)
HEMOGLOBIN: 15.1 g/dL (ref 13.0–18.0)
MCH: 27.2 pg (ref 26.0–34.0)
MCHC: 33 g/dL (ref 32.0–36.0)
MCV: 82.5 fL (ref 80.0–100.0)
Platelets: 198 10*3/uL (ref 150–440)
RBC: 5.56 MIL/uL (ref 4.40–5.90)
RDW: 16.6 % — ABNORMAL HIGH (ref 11.5–14.5)
WBC: 9.7 10*3/uL (ref 3.8–10.6)

## 2015-07-11 LAB — PROTIME-INR
INR: 1.1
Prothrombin Time: 14.4 seconds (ref 11.4–15.0)

## 2015-07-11 LAB — GLUCOSE, CAPILLARY: GLUCOSE-CAPILLARY: 97 mg/dL (ref 65–99)

## 2015-07-11 SURGERY — COLONOSCOPY WITH PROPOFOL
Anesthesia: General

## 2015-07-11 MED ORDER — MIDAZOLAM HCL 5 MG/5ML IJ SOLN
INTRAMUSCULAR | Status: DC | PRN
  Administered 2015-07-11: 1 mg via INTRAVENOUS

## 2015-07-11 MED ORDER — FENTANYL CITRATE (PF) 100 MCG/2ML IJ SOLN
INTRAMUSCULAR | Status: DC | PRN
  Administered 2015-07-11: 50 ug via INTRAVENOUS

## 2015-07-11 MED ORDER — EPHEDRINE SULFATE 50 MG/ML IJ SOLN
INTRAMUSCULAR | Status: DC | PRN
  Administered 2015-07-11 (×2): 10 mg via INTRAVENOUS

## 2015-07-11 MED ORDER — LIDOCAINE HCL (CARDIAC) 20 MG/ML IV SOLN
INTRAVENOUS | Status: DC | PRN
  Administered 2015-07-11: 30 mg via INTRAVENOUS

## 2015-07-11 MED ORDER — SODIUM CHLORIDE 0.9 % IV SOLN: INTRAVENOUS | Status: DC

## 2015-07-11 MED ORDER — CLINDAMYCIN PHOSPHATE 600 MG/50ML IV SOLN
600.0000 mg | Freq: Once | INTRAVENOUS | Status: AC
  Administered 2015-07-11: 600 mg via INTRAVENOUS

## 2015-07-11 MED ORDER — SODIUM CHLORIDE 0.9 % IV SOLN
INTRAVENOUS | Status: DC
  Administered 2015-07-11: 1000 mL via INTRAVENOUS

## 2015-07-11 MED ORDER — PROPOFOL 10 MG/ML IV BOLUS
INTRAVENOUS | Status: DC | PRN
  Administered 2015-07-11: 100 mg via INTRAVENOUS

## 2015-07-11 MED ORDER — PHENYLEPHRINE HCL 10 MG/ML IJ SOLN
INTRAMUSCULAR | Status: DC | PRN
  Administered 2015-07-11: 100 ug via INTRAVENOUS

## 2015-07-11 MED ORDER — PROPOFOL 500 MG/50ML IV EMUL
INTRAVENOUS | Status: DC | PRN
  Administered 2015-07-11: 180 ug/kg/min via INTRAVENOUS

## 2015-07-11 NOTE — Anesthesia Preprocedure Evaluation (Addendum)
Anesthesia Evaluation  Patient identified by MRN, date of birth, ID band Patient awake    Reviewed: Allergy & Precautions, H&P , NPO status , Patient's Chart, lab work & pertinent test results  History of Anesthesia Complications (+) history of anesthetic complications (overdose)  Airway Mallampati: III  TM Distance: >3 FB Neck ROM: limited    Dental  (+) Poor Dentition, Chipped, Caps   Pulmonary neg shortness of breath, sleep apnea and Continuous Positive Airway Pressure Ventilation ,    Pulmonary exam normal breath sounds clear to auscultation       Cardiovascular Exercise Tolerance: Good hypertension, +CHF  Normal cardiovascular exam Rhythm:regular Rate:Normal     Neuro/Psych  Neuromuscular disease negative psych ROS   GI/Hepatic Neg liver ROS, GERD  Controlled,  Endo/Other  diabetes, Type 2  Renal/GU negative Renal ROS  negative genitourinary   Musculoskeletal  (+) Arthritis ,   Abdominal   Peds  Hematology negative hematology ROS (+) anemia ,   Anesthesia Other Findings Past Medical History:   Diabetes mellitus (H[facility])                                      BPH (benign prostatic hyperplasia)                           Lower urinary tract symptoms                                 Acute cystitis                                               Hypogonadism in male                                         Erectile dysfunction                                         Obesity                                                      Prostatitis                                                  History of DVT (deep vein thrombosis)                        Post-void dribbling                                          Infection of skin  Allergic rhinitis                                            Complication of anesthesia                                     Comment:long time to wake  Carpal tunnel syndrome                                       CHF (congestive heart failure) (HCC)                         DDD (degenerative disc disease), lumbar                      Degenerative lumbar spinal stenosis                          GERD (gastroesophageal reflux disease)                       Gout                                                         Iron deficiency anemia                                       History of migraine headaches                                History of pulmonary embolism                                Hypercholesterolemia                                         Hypercoagulable state ([place])                                  Hypertension                                                 Sleep apnea                                                  Meralgia paresthetica  SNHL (sensorineural hearing loss)                           Past Surgical History:   REPLACEMENT TOTAL KNEE                          Left 2012         FOOT SURGERY                                    Bilateral              Blood vessel Surgery                                          Hydrocelectomy                                                  Comment:x 2   CARPAL TUNNEL RELEASE                           Bilateral 2011         TONSILLECTOMY                                    1958         SHOULDER SURGERY                                Right 1968         LEG SURGERY                                     Right 1974           Comment:fatty tumor   Lance [name]           Comment:x2   HYDROCELE EXCISION / REPAIR                      1985           Comment:x2   FOOT SURGERY  Left 2003           Comment:complicated by DVT   TOTAL SHOULDER ARTHROPLASTY                      Right 09/18/2014      Comment:arthroplasty, glenohumeral joint; total               shoulder (glenoid and prosimal humeral               replacement)   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 Hollice Espy     Reproductive/Obstetrics negative OB ROS                            Anesthesia Physical Anesthesia Plan  ASA: III  Anesthesia Plan: General   Post-op Pain Management:    Induction:   Airway Management Planned:   Additional Equipment:   Intra-op Plan:   Post-operative Plan:   Informed Consent: I have reviewed the patients History and Physical, chart, labs and discussed the procedure including the risks, benefits and alternatives for the proposed anesthesia with the patient or authorized representative who has indicated his/her understanding and acceptance.   Dental Advisory Given  Plan Discussed with: Anesthesiologist, CRNA and Surgeon  Anesthesia Plan Comments:         Anesthesia Quick Evaluation

## 2015-07-11 NOTE — Anesthesia Procedure Notes (Signed)
Date/Time: 07/11/2015 4:19 PM Performed by: Nelda Marseille Pre-anesthesia Checklist: Patient identified, Emergency Drugs available, Suction available, Patient being monitored and Timeout performed Oxygen Delivery Method: Nasal cannula

## 2015-07-11 NOTE — Op Note (Signed)
[facility] Gastroenterology Patient Name: Lance [name] Procedure Date: 07/11/2015 3:55 PM MRN: [id number] Account #: [id number] Date of Birth: [birth year] Admit Type: Outpatient Age: 65 Room: [facility] ENDO ROOM 3 Gender: Male Note Status: Finalized Procedure:            Colonoscopy Indications:          Screening for colorectal malignant neoplasm Providers:            Lollie Sails, MD Referring MD:         Christena Flake. Raechel Ache, MD (Referring MD) Medicines:            Monitored Anesthesia Care Complications:        No immediate complications. Procedure:            Pre-Anesthesia Assessment:                       - ASA Grade Assessment: III - A patient with severe                        systemic disease.                       After obtaining informed consent, the colonoscope was                        passed under direct vision. Throughout the procedure,                        the patient's blood pressure, pulse, and oxygen                        saturations were monitored continuously. The Olympus                        PCF-H180AL colonoscope ( S#: [id number] ) was introduced                        through the anus and advanced to the the cecum,                        identified by appendiceal orifice and ileocecal valve.                        The colonoscopy was performed without difficulty. The                        patient tolerated the procedure well. The quality of                        the bowel preparation was fair. Findings:      Four sessile polyps were found in the cecum and appendiceal orifice. The       polyps were 1 to 2 mm in size. These polyps were removed with a cold       biopsy forceps. Resection and retrieval were complete.      A 3 mm polyp was found in the hepatic flexure. The polyp was flat. The       polyp was removed with a cold biopsy forceps. Resection and retrieval       were complete.      The exam was  otherwise without abnormality.    Non-bleeding internal hemorrhoids were found during retroflexion and       during anoscopy. The hemorrhoids were medium-sized and Grade I (internal       hemorrhoids that do not prolapse).      The digital rectal exam was normal. Impression:           - Preparation of the colon was fair.                       - Four 1 to 2 mm polyps in the cecum and at the                        appendiceal orifice, removed with a cold biopsy                        forceps. Resected and retrieved.                       - One 3 mm polyp at the hepatic flexure, removed with a                        cold biopsy forceps. Resected and retrieved.                       - The examination was otherwise normal.                       - Non-bleeding internal hemorrhoids. Recommendation:       - Discharge patient to home. Procedure Code(s):    --- Professional ---                       [phone], Colonoscopy, flexible; with biopsy, single or                        multiple Diagnosis Code(s):    --- Professional ---                       Z12.11, Encounter for screening for malignant neoplasm                        of colon                       K64.0, First degree hemorrhoids                       D12.0, Benign neoplasm of cecum                       D12.1, Benign neoplasm of appendix                       D12.3, Benign neoplasm of transverse colon (hepatic                        flexure or splenic flexure) CPT copyright 2016 American Medical Association. All rights reserved. The codes documented in this report are preliminary and upon coder review may  be revised to meet current compliance requirements. Lollie Sails, MD 07/11/2015 4:33:28 PM This report has been signed electronically. Number of Addenda: 0 Note Initiated On:  07/11/2015 3:55 PM Scope Withdrawal Time: 0 hours 16 minutes 10 seconds  Total Procedure Duration: 0 hours 23 minutes 43 seconds       [facility]

## 2015-07-11 NOTE — H&P (Signed)
Outpatient short stay form Pre-procedure 07/11/2015 3:41 PM Lollie Sails MD  Primary Physician: Dr. Genene Churn  Reason for visit:  Colonoscopy  History of present illness:  Patient is a 65 year old male presenting for a colonoscopy. This for colorectal cancer screening. He has held his Coumadin for 5 days and his INR today was 1.10. He also had a CBC showing a platelet count of 198. He tolerated his prep well. Does have a history of a shoulder replacement surgery being done within the past year. Discussed antibiotics with him and he will be given and clindamycin 600 mg IV.    Current facility-administered medications:  .  0.9 %  sodium chloride infusion, , Intravenous, Continuous, Lollie Sails, MD, Last Rate: 20 mL/hr at 07/11/15 1423, 1,000 mL at 07/11/15 1423 .  0.9 %  sodium chloride infusion, , Intravenous, Continuous, Lollie Sails, MD .  clindamycin (CLEOCIN) IVPB 600 mg, 600 mg, Intravenous, Once, Lollie Sails, MD  Prescriptions prior to admission  Medication Sig Dispense Refill Last Dose  . HYDROcodone-acetaminophen (NORCO/VICODIN) 5-325 MG per tablet 5-325 tablets. 1/2 to 1 tablet 3 times a day   07/11/2015 at Unknown time  . Methylnaltrexone Bromide (RELISTOR) 150 MG TABS Take 450 mg by mouth every morning.     Marland Kitchen albuterol (PROAIR HFA) 108 (90 BASE) MCG/ACT inhaler Inhale into the lungs.   Not Taking  . cyclobenzaprine (FLEXERIL) 10 MG tablet Take by mouth.   Taking  . fluticasone (FLONASE) 50 MCG/ACT nasal spray 1 spray by Each Nare route daily as needed for rhinitis.   Not Taking  . gabapentin (NEURONTIN) 300 MG capsule Take 300 mg by mouth.   Taking  . Insulin Pen Needle (B-D ULTRAFINE III SHORT PEN) 31G X 8 MM MISC once daily.   Taking  . lansoprazole (PREVACID) 30 MG capsule Take 30 mg by mouth.   Not Taking  . Liraglutide (VICTOZA) 18 MG/3ML SOPN Inject into the skin.   Taking  . lisinopril-hydrochlorothiazide (PRINZIDE,ZESTORETIC) 20-12.5 MG per tablet  Take by mouth.   Taking  . Multiple Vitamin (MULTI-VITAMINS) TABS Take by mouth.   Taking  . ONETOUCH DELICA LANCETS [id number] MISC    Taking  . pantoprazole (PROTONIX) 40 MG tablet    Taking  . tadalafil (CIALIS) 5 MG tablet Take by mouth.   Taking  . tamsulosin (FLOMAX) 0.4 MG CAPS capsule Take 1 capsule (0.4 mg total) by mouth daily. (Patient not taking: Reported on 04/17/2015) 90 capsule 3 Not Taking  . warfarin (COUMADIN) 5 MG tablet Take by mouth.   Taking     Allergies  Allergen Reactions  . Penicillins     Other reaction(s): UNKNOWN     Past Medical History  Diagnosis Date  . Diabetes mellitus ([place])   . BPH (benign prostatic hyperplasia)   . Lower urinary tract symptoms   . Acute cystitis   . Hypogonadism in male   . Erectile dysfunction   . Obesity   . Prostatitis   . History of DVT (deep vein thrombosis)   . Post-void dribbling   . Infection of skin   . Allergic rhinitis   . Complication of anesthesia     long time to wake  . Carpal tunnel syndrome   . CHF (congestive heart failure) ([place])   . DDD (degenerative disc disease), lumbar   . Degenerative lumbar spinal stenosis   . GERD (gastroesophageal reflux disease)   . Gout   . Iron deficiency anemia   .  History of migraine headaches   . History of pulmonary embolism   . Hypercholesterolemia   . Hypercoagulable state ([place])   . Hypertension   . Sleep apnea   . Meralgia paresthetica   . SNHL (sensorineural hearing loss)     Review of systems:      Physical Exam    Heart and lungs: Regular rate and rhythm without rub or gallop, lungs are bilaterally clear.    HEENT: Normocephalic atraumatic eyes are anicteric    Other:     Pertinant exam for procedure: Protuberant nontender nondistended bowel sounds are positive    Planned proceedures: Colonoscopy and indicated procedures. I have discussed the risks benefits and complications of procedures to include not limited to bleeding, infection, perforation and  the risk of sedation and the patient wishes to proceed.    Lollie Sails, MD Gastroenterology 07/11/2015  3:41 PM

## 2015-07-11 NOTE — Transfer of Care (Signed)
Immediate Anesthesia Transfer of Care Note  Patient: Lance [name]  Procedure(s) Performed: Procedure(s): COLONOSCOPY WITH PROPOFOL (N/A)  Patient Location: PACU and Endoscopy Unit  Anesthesia Type:General  Level of Consciousness: sedated  Airway & Oxygen Therapy: Patient Spontanous Breathing and Patient connected to nasal cannula oxygen  Post-op Assessment: Report given to RN and Post -op Vital signs reviewed and stable  Post vital signs: Reviewed and stable  Last Vitals:  Filed Vitals:   07/11/15 1419 07/11/15 1634  BP:  85/40  Pulse: 83   Temp: 36.8 C 36.4 C  Resp: 20     Complications: No apparent anesthesia complications

## 2015-07-11 NOTE — Anesthesia Postprocedure Evaluation (Signed)
Anesthesia Post Note  Patient: Lance [name]  Procedure(s) Performed: Procedure(s) (LRB): COLONOSCOPY WITH PROPOFOL (N/A)  Patient location during evaluation: PACU Anesthesia Type: General Level of consciousness: awake and alert and oriented Pain management: pain level controlled Vital Signs Assessment: post-procedure vital signs reviewed and stable Respiratory status: spontaneous breathing Cardiovascular status: blood pressure returned to baseline Anesthetic complications: no    Last Vitals:  Filed Vitals:   07/11/15 1658 07/11/15 1704  BP: 87/66 123/54  Pulse: 89 92  Temp:    Resp: 12 14    Last Pain: There were no vitals filed for this visit.               Evanthia Maund

## 2015-07-14 ENCOUNTER — Encounter: Payer: Self-pay | Admitting: Gastroenterology

## 2015-07-15 LAB — SURGICAL PATHOLOGY

## 2015-07-16 ENCOUNTER — Ambulatory Visit: Payer: Medicare Other | Admitting: Urology

## 2015-07-21 ENCOUNTER — Ambulatory Visit (INDEPENDENT_AMBULATORY_CARE_PROVIDER_SITE_OTHER): Payer: Medicare Other | Admitting: Urology

## 2015-07-21 ENCOUNTER — Encounter: Payer: Self-pay | Admitting: Urology

## 2015-07-21 VITALS — BP 133/73 | HR 68 | Ht 73.0 in | Wt 302.5 lb

## 2015-07-21 DIAGNOSIS — N138 Other obstructive and reflux uropathy: Secondary | ICD-10-CM

## 2015-07-21 DIAGNOSIS — N528 Other male erectile dysfunction: Secondary | ICD-10-CM | POA: Diagnosis not present

## 2015-07-21 DIAGNOSIS — N401 Enlarged prostate with lower urinary tract symptoms: Secondary | ICD-10-CM

## 2015-07-21 DIAGNOSIS — E291 Testicular hypofunction: Secondary | ICD-10-CM | POA: Diagnosis not present

## 2015-07-21 DIAGNOSIS — N529 Male erectile dysfunction, unspecified: Secondary | ICD-10-CM

## 2015-07-21 NOTE — Progress Notes (Signed)
10:21 AM   Lance [name] [birth year] [id number]  Referring provider: Ezequiel Kayser, MD [facility] [place], [place] [address]  Chief Complaint  Patient presents with  . Follow-up    HPI: Patient is a 65 year old white male with hypogonadism, erectile dysfunction and BPH with LUTS who presents today for 6 month follow-up.  Hypogonadism Patient is experiencing a decrease in libido, a lack of energy, a decrease in strength, a loss in height, a decreased enjoyment in life, sadness and/or grumpiness, erections being less strong, a recent deterioration in an ability to play sports, falling asleep after dinner and a recent deterioration in their work performance.  This is indicated by his responses to the ADAM questionnaire.   His pretreatment testosterone level was 326 ng/dL on 11/07/2014.  He is currently managing his hypogonadism with Testopel.        Androgen Deficiency in the Aging Male      07/21/15 0900       Androgen Deficiency in the Aging Male   Do you have a decrease in libido (sex drive) Yes     Do you have lack of energy Yes     Do you have a decrease in strength and/or endurance Yes     Have you lost height Yes     Have you noticed a decreased "enjoyment of life" Yes     Are you sad and/or grumpy Yes     Are your erections less strong Yes     Have you noticed a recent deterioration in your ability to play sports Yes     Are you falling asleep after dinner Yes     Has there been a recent deterioration in your work performance Yes        BPH WITH LUTS His IPSS score today is 15, which is moderate lower urinary tract symptomatology. He is mixed with his quality life due to his urinary symptoms.  His major complaint today voiding with a double stream.  He has had these symptoms for the last year.  He denies any dysuria, hematuria or suprapubic pain.   He was taking Cialis 5 mg daily, but he had to discontinue the medication due to lack of  insurance coverage.  He is not taking the tamsulosin due to ejaculatory disorders.  His has had HoLEP on 08/28/2014.  He also denies any recent fevers, chills, nausea or vomiting.   He does not have a family history of PCa.      IPSS      07/21/15 0900       International Prostate Symptom Score   How often have you had the sensation of not emptying your bladder? Less than 1 in 5     How often have you had to urinate less than every two hours? About half the time     How often have you found you stopped and started again several times when you urinated? Almost always     How often have you found it difficult to postpone urination? Less than 1 in 5 times     How often have you had a weak urinary stream? Less than half the time     How often have you had to strain to start urination? Less than half the time     How many times did you typically get up at night to urinate? 1 Time     Total IPSS Score 15     Quality of  Life due to urinary symptoms   If you were to spend the rest of your life with your urinary condition just the way it is now how would you feel about that? Mixed        Score:  1-7 Mild 8-19 Moderate 20-35 Severe  Erectile dysfunction His SHIM score is 12, which is mild-moderate ED.   He has been having difficulty with erections for several years.     His major complaint is lack of ejaculation fluid and lack of orgasm.  His libido is preserved.   His risk factors for ED are DM, BPH and anticoagulation therapy.  He denies any painful erections or curvatures with his erections.   He has tried PDE5-inhibitors in the past, but he finds them cost prohibitive.          SHIM      07/21/15 0957       SHIM: Over the last 6 months:   How do you rate your confidence that you could get and keep an erection? Low     When you had erections with sexual stimulation, how often were your erections hard enough for penetration (entering your partner)? Sometimes (about half the time)      During sexual intercourse, how often were you able to maintain your erection after you had penetrated (entered) your partner? Difficult     During sexual intercourse, how difficult was it to maintain your erection to completion of intercourse? Difficult     When you attempted sexual intercourse, how often was it satisfactory for you? Extremely Difficult     SHIM Total Score   SHIM 12        Score: 1-7 Severe ED 8-11 Moderate ED 12-16 Mild-Moderate ED 17-21 Mild ED 22-25 No ED   PMH: Past Medical History  Diagnosis Date  . Diabetes mellitus ([place])   . BPH (benign prostatic hyperplasia)   . Lower urinary tract symptoms   . Acute cystitis   . Hypogonadism in male   . Erectile dysfunction   . Obesity   . Prostatitis   . History of DVT (deep vein thrombosis)   . Post-void dribbling   . Infection of skin   . Allergic rhinitis   . Complication of anesthesia     long time to wake  . Carpal tunnel syndrome   . CHF (congestive heart failure) ([place])   . DDD (degenerative disc disease), lumbar   . Degenerative lumbar spinal stenosis   . GERD (gastroesophageal reflux disease)   . Gout   . Iron deficiency anemia   . History of migraine headaches   . History of pulmonary embolism   . Hypercholesterolemia   . Hypercoagulable state ([place])   . Hypertension   . Sleep apnea   . Meralgia paresthetica   . SNHL (sensorineural hearing loss)     Surgical History: Past Surgical History  Procedure Laterality Date  . Replacement total knee Left 2012  . Foot surgery Bilateral   . Blood vessel surgery    . Hydrocelectomy      x 2  . Carpal tunnel release Bilateral 2011  . Tonsillectomy  1958  . Shoulder surgery Right 1968  . Leg surgery Right 1974    fatty tumor  . Vasectomy  1979  . Hemorrhoid surgery  1984  . Hernia repair  1985    x2  . Hydrocele excision / repair  1985    x2  . Foot surgery Left [id number]    complicated  by DVT  . Total shoulder arthroplasty Right 09/18/2014     arthroplasty, glenohumeral joint; total shoulder (glenoid and prosimal humeral replacement)  . 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 Hollice Espy  . Colonoscopy with propofol N/A 07/11/2015    Procedure: COLONOSCOPY WITH PROPOFOL;  Surgeon: Lollie Sails, MD;  Location: [facility] ENDOSCOPY;  Service: Endoscopy;  Laterality: N/A;    Home Medications:    Medication List       This list is accurate as of: 07/21/15 10:20 AM.  Always use your most recent med list.               B-D ULTRAFINE III SHORT PEN 31G X 8 MM Misc  Generic drug:  Insulin Pen Needle  once daily.     CIALIS 5 MG tablet  Generic drug:  tadalafil  Take by mouth.     cyclobenzaprine 10 MG tablet  Commonly known as:  FLEXERIL  Take by mouth.     fluticasone 50 MCG/ACT nasal spray  Commonly known as:  FLONASE  1 spray by Each Nare route daily as needed for rhinitis.     HYDROcodone-acetaminophen 5-325 MG tablet  Commonly known as:  NORCO/VICODIN  5-325 tablets. 1/2 to 1 tablet 3 times a day     lansoprazole 30 MG capsule  Commonly known as:  PREVACID  Take 30 mg by mouth.     lisinopril-hydrochlorothiazide 20-12.5 MG tablet  Commonly known as:  PRINZIDE,ZESTORETIC  Take by mouth.     MULTI-VITAMINS Tabs  Take by mouth.     NEURONTIN 300 MG capsule  Generic drug:  gabapentin  Take 300 mg by mouth.     ONETOUCH DELICA LANCETS 99991111 Misc     pantoprazole 40 MG tablet  Commonly known as:  PROTONIX     PROAIR HFA 108 (90 Base) MCG/ACT inhaler  Generic drug:  albuterol  Inhale into the lungs.     RELISTOR 150 MG Tabs  Generic drug:  Methylnaltrexone Bromide  Take 450 mg by mouth every morning.     tamsulosin 0.4 MG Caps capsule  Commonly known as:  FLOMAX  Take 1 capsule (0.4 mg total) by mouth daily.     VICTOZA 18 MG/3ML Sopn  Generic drug:  Liraglutide  Inject into the skin.     warfarin 5 MG tablet  Commonly known as:  COUMADIN  Take by mouth.          Allergies:  Allergies  Allergen Reactions  . Penicillins     Other reaction(s): UNKNOWN    Family History: Family History  Problem Relation Age of Onset  . Thrombosis    . Skin cancer Father   . Kidney disease Neg Hx   . Prostate cancer Neg Hx     Social History:  reports that he has never smoked. He does not have any smokeless tobacco history on file. He reports that he does not drink alcohol or use illicit drugs.  ROS: UROLOGY Frequent Urination?: No Hard to postpone urination?: No Burning/pain with urination?: No Get up at night to urinate?: No Leakage of urine?: No Urine stream starts and stops?: Yes Trouble starting stream?: No Do you have to strain to urinate?: No Blood in urine?: No Urinary tract infection?: No Sexually transmitted disease?: No Injury to kidneys or bladder?: No Painful intercourse?: No Weak stream?: No Erection problems?: Yes Penile pain?: No  Gastrointestinal Nausea?: No Vomiting?: No Indigestion/heartburn?: No Diarrhea?: No Constipation?:  Yes  Constitutional Fever: No Night sweats?: No Weight loss?: No Fatigue?: No  Skin Skin rash/lesions?: No Itching?: No  Eyes Blurred vision?: No Double vision?: No  Ears/Nose/Throat Sore throat?: No Sinus problems?: No  Hematologic/Lymphatic Swollen glands?: No Easy bruising?: No  Cardiovascular Leg swelling?: Yes Chest pain?: No  Respiratory Cough?: No Shortness of breath?: No  Endocrine Excessive thirst?: No  Musculoskeletal Back pain?: Yes Joint pain?: Yes  Neurological Headaches?: No Dizziness?: No  Psychologic Depression?: No Anxiety?: No  Physical Exam: BP 133/73 mmHg  Pulse 68  Ht 6\' 1"  (1.854 m)  Wt 302 lb 8 oz (137.213 kg)  BMI 39.92 kg/m2  GU: No CVA tenderness.  No bladder fullness or masses.  Patient with circumcised phallus.  Urethral meatus is patent.  No penile discharge. No penile lesions or rashes. Scrotum without lesions, cysts, rashes  and/or edema.  Testicles are located scrotally bilaterally. No masses are appreciated in the testicles. Left and right epididymis are normal. Rectal: Patient with  normal sphincter tone. Anus and perineum without scarring or rashes. No rectal masses are appreciated. Prostate is approximately 50 grams, no nodules are appreciated. Seminal vesicles are normal.   Laboratory Data: Lab Results  Component Value Date   WBC 9.7 07/11/2015   HGB 15.1 07/11/2015   HCT 45.9 07/11/2015   MCV 82.5 07/11/2015   PLT 198 07/11/2015    Lab Results  Component Value Date   CREATININE 0.89 08/19/2014   PSA History  0.3 ng/mL on 06/04/2013  0.3 ng/mL on 12/31/2013  0.3 ng/mL on 07/02/2014  0.5 ng/mL on 04/10/2015  Lab Results  Component Value Date   TESTOSTERONE 283* 04/10/2015   Assessment & Plan:    1. Hypogonadism in male:   Patient's last Testopel insertion was on 04/17/2015.  He is interested in continuing the insertions. If his labs return within normal values, he will have his insertion next week.    - Estradiol - testosterone - HCT  2. BPH (benign prostatic hyperplasia) with LUTS:   IPSS score is 15/3.  He is s/p HoLEP on 08/28/2014.  Pathology benign.   Patient is no longer taking Cialis 5 mg daily. He finds it cost prohibitive and his insurance will not cover the medication.   He had been on tamsulosin 0.4 mg in the past with good control of his urinary symptoms. He had discontinued it due to ejaculatory disorders.  He tried the tamsulosin for one week and found it didn't stop the splitting of the urinary stream, so he discontinued the medication.  I am not familiar with the HoLEP procedure, so I will discuss this with Dr. Erlene Quan this week to see if this is a normal side effect of the procedure and what she  recommends.    3. Erectile dysfunction:   SHIM score is 12.  Patient has sildenafil 20 mg available to him. He states that sex is not as enjoyable since he cannot ejaculate.  He states  he also has difficulty achieving orgasm.  We did discuss intracavernousal injections.  He is not interested in the injections at this time.  He will continue the sildenafil.  He will return to the clinic in 6 months for SHIM score and exam.    Return for schedule Testopel.  Zara Council, PA-C  [facility] [address] [place], [place] [address] [phone]  Addendum:   Spoke with Dr. Erlene Quan and she suggested a cystoscopy.  He will be returning next week for  his Testopel and we will schedule one at that time.

## 2015-07-22 LAB — TESTOSTERONE: Testosterone: 259 ng/dL — ABNORMAL LOW (ref 348–1197)

## 2015-07-22 LAB — HEMATOCRIT: Hematocrit: 43.4 % (ref 37.5–51.0)

## 2015-07-22 LAB — ESTRADIOL: Estradiol: 38.5 pg/mL (ref 7.6–42.6)

## 2015-07-24 DIAGNOSIS — N529 Male erectile dysfunction, unspecified: Secondary | ICD-10-CM | POA: Insufficient documentation

## 2015-07-25 ENCOUNTER — Telehealth: Payer: Self-pay

## 2015-07-25 NOTE — Telephone Encounter (Signed)
Spoke with pt in reference to labs and testopel. Pt voiced understanding.

## 2015-07-25 NOTE — Telephone Encounter (Signed)
-----   Message from Nori Riis, PA-C sent at 07/22/2015  8:15 AM EDT ----- Labs as expected.  Proceed with Testopel.

## 2015-07-29 ENCOUNTER — Ambulatory Visit (INDEPENDENT_AMBULATORY_CARE_PROVIDER_SITE_OTHER): Payer: Medicare Other | Admitting: Urology

## 2015-07-29 ENCOUNTER — Encounter: Payer: Self-pay | Admitting: Urology

## 2015-07-29 VITALS — BP 111/63 | HR 67 | Ht 73.0 in | Wt 300.0 lb

## 2015-07-29 DIAGNOSIS — E291 Testicular hypofunction: Secondary | ICD-10-CM

## 2015-07-29 MED ORDER — SILDENAFIL CITRATE 20 MG PO TABS: ORAL_TABLET | ORAL | Status: DC

## 2015-07-29 MED ORDER — TESTOSTERONE 75 MG IL PLLT
75.0000 mg | PELLET | Freq: Once | Status: AC
  Administered 2015-07-29: 75 mg

## 2015-07-29 NOTE — Progress Notes (Signed)
This is a 65 -year-old male with hypogonadism and he is managed with Testopel. He presents today for Testopel insertion.  Patient is placed on the exam table in the left lateral jackknife position.  Identified upper outer quadrant of hip for insertion; prepped area with Betadine and injected 10 cc's of Lidocaine 1% with Epinephrine to anesthetize superficially and distally along trocar tract.  Made 3 mm incision using 10 blade of scalpel; trocar with sharp ended stylet was inserted into subcutaneous tissue in line with femur. Sharp stylet was withdrawn and 6 pellets were placed into trocar well. Testopel pellets advanced into tissue using blunt ended stylet. Trocar removed and incision closed using 6 Steri-Strips. Cleansed area to remove Betadine and covered Steri-Strips with outer Band-Aid.  Careful inspection of insertion is done and patient informed of post procedure instructions.  He will return in three month for serum testosterone.

## 2015-08-18 ENCOUNTER — Other Ambulatory Visit: Payer: Self-pay | Admitting: Physical Medicine and Rehabilitation

## 2015-08-18 DIAGNOSIS — M5416 Radiculopathy, lumbar region: Secondary | ICD-10-CM

## 2015-09-03 ENCOUNTER — Ambulatory Visit
Admission: EM | Admit: 2015-09-03 | Discharge: 2015-09-03 | Disposition: A | Discharge status: Home / Self Care | Payer: Medicare Other | Attending: Family Medicine | Admitting: Family Medicine

## 2015-09-03 ENCOUNTER — Ambulatory Visit: Payer: Medicare Other

## 2015-09-03 DIAGNOSIS — M795 Residual foreign body in soft tissue: Secondary | ICD-10-CM | POA: Insufficient documentation

## 2015-09-03 DIAGNOSIS — M7989 Other specified soft tissue disorders: Secondary | ICD-10-CM | POA: Insufficient documentation

## 2015-09-03 DIAGNOSIS — S6982XA Other specified injuries of left wrist, hand and finger(s), initial encounter: Secondary | ICD-10-CM | POA: Diagnosis not present

## 2015-09-03 DIAGNOSIS — W458XXA Other foreign body or object entering through skin, initial encounter: Secondary | ICD-10-CM

## 2015-09-03 DIAGNOSIS — S6992XA Unspecified injury of left wrist, hand and finger(s), initial encounter: Secondary | ICD-10-CM

## 2015-09-03 MED ORDER — LIDOCAINE HCL (PF) 1 % IJ SOLN: 10.0000 mL | Freq: Once | INTRAMUSCULAR | Status: DC

## 2015-09-03 MED ORDER — BACITRACIN ZINC 500 UNIT/GM EX OINT: TOPICAL_OINTMENT | Freq: Two times a day (BID) | CUTANEOUS | Status: DC

## 2015-09-03 MED ORDER — CEPHALEXIN 500 MG PO CAPS: 500.0000 mg | ORAL_CAPSULE | Freq: Four times a day (QID) | ORAL | Status: AC

## 2015-09-03 NOTE — Discharge Instructions (Signed)
Take medication as prescribed. Rest. Drink plenty of fluids. Keep clean with soap and water, Apply topical antibiotic at home as discussed.   Follow up with your primary care physician this week as discussed. Return to Urgent care or ER for redness, swelling, drainage, pain, new or worsening concerns.

## 2015-09-03 NOTE — ED Provider Notes (Signed)
[facility]  ____________________________________________  Time seen: Approximately 1:22 PM  I have reviewed the triage vital signs and the nursing notes.   HISTORY  Chief Complaint Foreign Body in Skin  HPI Lance [name] is a 65 y.o. male  presents with a complaint of fishhook in left fifth finger. Patient reports occurred approximately one hour prior to arrival. Patient reports that he was just getting his fishing gear ready. Patient states that he had a tangle line with the fissure, and then states that the wind caught the line causing the line to fall. Patient states that in the process of catching it with the wind, he ended up lodging into his left distal finger. States he tried to remove it on his own and was unable to. Patient states he also tried to push the barb through to cut it off but was unable to. Patient states minimal pain directly at fish hook site. Denies any other pain. Denies numbness or tingling sensation. Denies decreased range of motion.  Patient reports that he is on Coumadin. Reports he is left-hand dominant.  Denies fall or other injury. Denies other complaints.   Patient reports that he is penicillin allergic. Patient states that he had a reaction as a baby. Patient presented is never had any other issues with any other antibiotics. Patient states that he has had cephalexin in the past and tolerated it well. Patient also reports that he has had cefazolin for many of his surgeries and tolerated well. Last tetanus immunization 2015.  PCP: Raechel Ache   Past Medical History  Diagnosis Date  . Diabetes mellitus ([place])   . BPH (benign prostatic hyperplasia)   . Lower urinary tract symptoms   . Acute cystitis   . Hypogonadism in male   . Erectile dysfunction   . Obesity   . Prostatitis   . History of DVT (deep vein thrombosis)   . Post-void dribbling   . Infection of skin   . Allergic rhinitis   . Complication of anesthesia     long time to wake  .  Carpal tunnel syndrome   . CHF (congestive heart failure) ([place])   . DDD (degenerative disc disease), lumbar   . Degenerative lumbar spinal stenosis   . GERD (gastroesophageal reflux disease)   . Gout   . Iron deficiency anemia   . History of migraine headaches   . History of pulmonary embolism   . Hypercholesterolemia   . Hypercoagulable state ([place])   . Hypertension   . Sleep apnea   . Meralgia paresthetica   . SNHL (sensorineural hearing loss)     Patient Active Problem List   Diagnosis Date Noted  . Erectile dysfunction of organic origin 07/24/2015  . BPH with obstruction/lower urinary tract symptoms 04/10/2015  . Hypogonadism in male 01/07/2015    Past Surgical History  Procedure Laterality Date  . Replacement total knee Left 2012  . Foot surgery Bilateral   . Blood vessel surgery    . Hydrocelectomy      x 2  . Carpal tunnel release Bilateral 2011  . Tonsillectomy  1958  . Shoulder surgery Right 1968  . Leg surgery Right 1974    fatty tumor  . Vasectomy  1979  . Hemorrhoid surgery  1984  . Hernia repair  1985    x2  . Hydrocele excision / repair  1985    x2  . Foot surgery Left [id number]    complicated by DVT  . Total shoulder arthroplasty  Right 09/18/2014    arthroplasty, glenohumeral joint; total shoulder (glenoid and prosimal humeral replacement)  . 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 Hollice Espy  . Colonoscopy with propofol N/A 07/11/2015    Procedure: COLONOSCOPY WITH PROPOFOL;  Surgeon: Lollie Sails, MD;  Location: [facility] ENDOSCOPY;  Service: Endoscopy;  Laterality: N/A;    Current Outpatient Rx  Name  Route  Sig  Dispense  Refill  . cyclobenzaprine (FLEXERIL) 10 MG tablet   Oral   Take by mouth.         . gabapentin (NEURONTIN) 300 MG capsule   Oral   Take 300 mg by mouth.         Marland Kitchen HYDROcodone-acetaminophen (NORCO/VICODIN) 5-325 MG per tablet      5-325 tablets. 1/2 to 1 tablet 3 times a day          . Insulin Pen Needle (B-D ULTRAFINE III SHORT PEN) 31G X 8 MM MISC      once daily.         Marland Kitchen lisinopril-hydrochlorothiazide (PRINZIDE,ZESTORETIC) 10-12.5 MG tablet   Oral   Take 1 tablet by mouth daily.         . Multiple Vitamin (MULTI-VITAMINS) TABS   Oral   Take by mouth.         Glory Rosebush DELICA LANCETS [id number] MISC               . pantoprazole (PROTONIX) 40 MG tablet               . sildenafil (REVATIO) 20 MG tablet      Take 3 to 5 tablets two hours before intercouse on an empty stomach.  Do not take with nitrates.   50 tablet   3   . tadalafil (CIALIS) 5 MG tablet   Oral   Take by mouth.         . warfarin (COUMADIN) 5 MG tablet   Oral   Take by mouth.         Marland Kitchen albuterol (PROAIR HFA) 108 (90 BASE) MCG/ACT inhaler   Inhalation   Inhale into the lungs. Reported on 07/29/2015         . fluticasone (FLONASE) 50 MCG/ACT nasal spray      Reported on 07/29/2015         . lansoprazole (PREVACID) 30 MG capsule   Oral   Take 30 mg by mouth. Reported on 07/29/2015         . EXPIRED: Liraglutide (VICTOZA) 18 MG/3ML SOPN   Subcutaneous   Inject into the skin.         Marland Kitchen EXPIRED: lisinopril-hydrochlorothiazide (PRINZIDE,ZESTORETIC) 20-12.5 MG per tablet   Oral   Take by mouth.         . Methylnaltrexone Bromide (RELISTOR) 150 MG TABS   Oral   Take 450 mg by mouth every morning. Reported on 07/29/2015         . tamsulosin (FLOMAX) 0.4 MG CAPS capsule   Oral   Take 1 capsule (0.4 mg total) by mouth daily. Patient not taking: Reported on 07/29/2015   90 capsule   3     Allergies Penicillins  Family History  Problem Relation Age of Onset  . Thrombosis    . Skin cancer Father   . Kidney disease Neg Hx   . Prostate cancer Neg Hx     Social History Social History  Substance Use Topics  . Smoking status:  Never Smoker   . Smokeless tobacco: None  . Alcohol Use: No    Review of Systems Constitutional: No fever/chills Eyes:  No visual changes. ENT: No sore throat. Cardiovascular: Denies chest pain. Respiratory: Denies shortness of breath. Gastrointestinal: No abdominal pain.  No nausea, no vomiting.  No diarrhea.  No constipation. Genitourinary: Negative for dysuria. Musculoskeletal: Negative for back pain. Skin: Negative for rash.As above. Neurological: Negative for headaches, focal weakness or numbness.  10-point ROS otherwise negative.  ____________________________________________   PHYSICAL EXAM:  VITAL SIGNS: ED Triage Vitals  Enc Vitals Group     BP 09/03/15 1259 120/59 mmHg     Pulse Rate 09/03/15 1259 80     Resp 09/03/15 1259 18     Temp 09/03/15 1259 97.7 F (36.5 C)     Temp Source 09/03/15 1259 Oral     SpO2 09/03/15 1259 98 %     Weight 09/03/15 1259 295 lb (133.811 kg)     Height 09/03/15 1259 6\' 1"  (1.854 m)     Head Cir --      Peak Flow --      Pain Score 09/03/15 1306 2     Pain Loc --      Pain Edu? --      Excl. in [place]? --     Constitutional: Alert and oriented. Well appearing and in no acute distress. Eyes: Conjunctivae are normal. PERRL. EOMI. Head: Atraumatic. Neck: No stridor.  No cervical spine tenderness to palpation. Cardiovascular: Normal rate, regular rhythm. Grossly normal heart sounds.  Good peripheral circulation. Respiratory: Normal respiratory effort.  No retractions. Lungs CTAB. Musculoskeletal: No cervical, thoracic or lumbar tenderness to palpation. Bilateral hand grips strong and equal. Bilateral distal radial pulses strong and equal. Left distal lateral fifth digit lateral distal phalanx embedded fishhook present, minimal active bleeding, minimal tenderness at direct site of fishhook insertion, distal sensation intact, full range of motion, no pain with distal flexion or extension. Left hand otherwise nontender. Neurologic:  Normal speech and language. No gross focal neurologic deficits are appreciated. No gait instability. Skin:  Skin is warm, dry and  intact. No rash noted. Psychiatric: Mood and affect are normal. Speech and behavior are normal.  ____________________________________________   LABS (all labs ordered are listed, but only abnormal results are displayed)  Labs Reviewed - No data to display ____________________________________________  RADIOLOGY   PROCEDURES  Procedure(s) performed:  Procedure(s) performed:  Procedure explained and verbal consent obtained. Consent: Verbal consent obtained. Written consent not obtained. Risks and benefits: risks, benefits and alternatives were discussed Patient identity confirmed: verbally with patient and hospital-assigned identification number  Consent given by: patient   Foreign body removal Location: Left distal fifth phalanx Foreign bodies : Fishhook present Tendon involvement: none Nerve involvement: none Preparation: Patient was prepped and draped in the usual sterile fashion. Anesthesia with 1% Lidocaine 4 mls Irrigation solution: saline and Betadine  Irrigation method: jet lavage Amount of cleaning: copious #11 blade scalpel used and very small incision made at [facility] base of fishhook, sterile hemostats utilized and with mild downward pressure fishhook removed. Patient tolerated this very well. Fishhook appears intact. No appearance of retained foreign bodies. Post foreign body removal, wound irrigated and cleaned with saline. Antibiotic ointment and dressing applied.  Wound care instructions provided.  Observe for any signs of infection or other problems.    Radiology   EXAM: LEFT LITTLE FINGER 2+V  COMPARISON: None.  FINDINGS: Three views of the left fifth finger. Diffuse soft  tissue swelling. Suggestion of trace subcutaneous gas on image 2 (arrow). No radiopaque foreign body identified. No acute osseous abnormality identified.  IMPRESSION: Soft-tissue swelling with trace subcutaneous gas, but no radiopaque foreign body identified.   Electronically  Signed By: Genevie Ann M.D. On: 09/03/2015 13:57      I, Marylene Land, personally viewed and evaluated these images (plain radiographs) as part of my medical decision making, as well as reviewing the written report by the radiologist.  ____________________________________________   INITIAL IMPRESSION / [place] / ED COURSE  Pertinent labs & imaging results that were available during my care of the patient were reviewed by me and considered in my medical decision making (see chart for details).  Very well-appearing patient. No acute distress. Presents with fishhook in left distal lateral finger. Local anesthesia with 1% lidocaine, patient tolerated well. Small incision made at base fish hook barb location and removed. Patient tolerated well. Will evaluate x-ray.  Per radiologist left fifth finger x-ray soft tissue swelling with trace subcutaneous gas, but no radiopaque foreign body identified per radiologist; no acute osseous injury. As patient diabetic wound was copiously cleaned and will place patient on oral cephalexin prophylaxis. Patient reports that last tetanus immunization within the last 5 years. Epic utilized and documented patient received Tdap 2015. Encouraging monitoring area closely and discuss signs of infection. Neurovascular intact without motor or tendon deficits. Post removal with fishhook left fifth digit completely nontender. As patient is on troponin, encourage patient to have INR check within this week due to possible antibiotic interaction.   Discussed follow up with Primary care physician this week. Discussed follow up and return parameters including no resolution or any worsening concerns. Patient verbalized understanding and agreed to plan.   ____________________________________________   FINAL CLINICAL IMPRESSION(S) / ED DIAGNOSES  Final diagnoses:  Foreign body (FB) in soft tissue  Fishhook injury to finger, left, initial encounter       Note: This dictation was prepared with Dragon dictation along with smaller phrase technology. Any transcriptional errors that result from this process are unintentional.    Marylene Land, NP 09/03/15 1447

## 2015-09-09 ENCOUNTER — Ambulatory Visit
Admission: RE | Admit: 2015-09-09 | Discharge: 2015-09-09 | Disposition: A | Discharge status: Home / Self Care | Payer: Medicare Other | Source: Ambulatory Visit | Attending: Physical Medicine and Rehabilitation | Admitting: Physical Medicine and Rehabilitation

## 2015-09-09 DIAGNOSIS — M545 Low back pain: Secondary | ICD-10-CM | POA: Insufficient documentation

## 2015-09-09 DIAGNOSIS — M47896 Other spondylosis, lumbar region: Secondary | ICD-10-CM | POA: Diagnosis not present

## 2015-09-09 DIAGNOSIS — M129 Arthropathy, unspecified: Secondary | ICD-10-CM | POA: Insufficient documentation

## 2015-09-09 DIAGNOSIS — M5416 Radiculopathy, lumbar region: Secondary | ICD-10-CM

## 2015-10-28 ENCOUNTER — Other Ambulatory Visit: Payer: Self-pay

## 2015-10-28 DIAGNOSIS — E291 Testicular hypofunction: Secondary | ICD-10-CM

## 2015-10-29 ENCOUNTER — Other Ambulatory Visit: Payer: Medicare Other

## 2015-10-29 DIAGNOSIS — E291 Testicular hypofunction: Secondary | ICD-10-CM

## 2015-10-30 LAB — TESTOSTERONE: TESTOSTERONE: 245 ng/dL — AB (ref 348–1197)

## 2015-11-04 ENCOUNTER — Telehealth: Payer: Self-pay

## 2015-11-04 DIAGNOSIS — N400 Enlarged prostate without lower urinary tract symptoms: Secondary | ICD-10-CM

## 2015-11-04 DIAGNOSIS — E291 Testicular hypofunction: Secondary | ICD-10-CM

## 2015-11-04 DIAGNOSIS — Z79899 Other long term (current) drug therapy: Secondary | ICD-10-CM

## 2015-11-04 NOTE — Telephone Encounter (Signed)
LMOM

## 2015-11-04 NOTE — Telephone Encounter (Signed)
-----   Message from Nori Riis, PA-C sent at 11/03/2015  9:42 PM EDT ----- Testosterone is low.  He will need an appointment in August for an IPSS, SHIM, ADAM and exam.  He will need a PSA, testosterone, HCT prior to the appointment.

## 2015-11-06 NOTE — Telephone Encounter (Signed)
Spoke with pt in reference to testosterone results. Pt voiced understanding. Labs ordered and f/u appt made.

## 2015-11-13 ENCOUNTER — Telehealth: Payer: Self-pay | Admitting: *Deleted

## 2015-11-13 NOTE — Telephone Encounter (Signed)
Spoke with patient per [facility] about Testopel forms. Patient's form is good until August 3rd 2017 and then he will need to fill out another one. Spoke with patient and he had labs drawn and they were low, patient wanted to book another Testopel so I gave him an appointment for July 27th. Patient ok with plan.

## 2015-12-04 ENCOUNTER — Encounter: Payer: Self-pay | Admitting: Urology

## 2015-12-04 ENCOUNTER — Ambulatory Visit (INDEPENDENT_AMBULATORY_CARE_PROVIDER_SITE_OTHER): Payer: Medicare Other | Admitting: Urology

## 2015-12-04 VITALS — BP 110/64 | HR 65 | Ht 73.0 in | Wt 296.8 lb

## 2015-12-04 DIAGNOSIS — E291 Testicular hypofunction: Secondary | ICD-10-CM

## 2015-12-04 MED ORDER — TESTOSTERONE 75 MG IL PLLT
75.0000 mg | PELLET | Freq: Once | Status: AC
  Administered 2015-12-04: 75 mg

## 2015-12-04 NOTE — Progress Notes (Signed)
This is a 65 -year-old male with hypogonadism and he is managed with Testopel. He presents today for Testopel insertion.  Patient is placed on the exam table in the right lateral jackknife position.  Identified upper outer quadrant of hip for insertion; prepped area with Betadine and injected 10 cc's of Lidocaine 1% with Epinephrine to anesthetize superficially and distally along trocar tract.  Made 3 mm incision using 15 blade of scalpel; trocar with sharp ended stylet was inserted into subcutaneous tissue in line with femur. Sharp stylet was withdrawn and 6 pellets were placed into trocar well. Testopel pellets advanced into tissue using blunt ended stylet. Trocar removed and incision closed using 6 Steri-Strips. Cleansed area to remove Betadine and covered Steri-Strips with outer Band-Aid.  Careful inspection of insertion is done and patient informed of post procedure instructions.  He will return in three month for IPSS, SHIM, ADAM, exam, serum testosterone, PSA and HCT.

## 2015-12-26 ENCOUNTER — Other Ambulatory Visit: Payer: Medicare Other

## 2015-12-26 DIAGNOSIS — N400 Enlarged prostate without lower urinary tract symptoms: Secondary | ICD-10-CM

## 2015-12-26 DIAGNOSIS — E291 Testicular hypofunction: Secondary | ICD-10-CM

## 2015-12-27 LAB — PSA: PROSTATE SPECIFIC AG, SERUM: 0.4 ng/mL (ref 0.0–4.0)

## 2015-12-27 LAB — TESTOSTERONE: TESTOSTERONE: 657 ng/dL (ref 264–916)

## 2015-12-27 LAB — HEMATOCRIT: HEMATOCRIT: 44.5 % (ref 37.5–51.0)

## 2015-12-29 ENCOUNTER — Encounter: Payer: Self-pay | Admitting: Urology

## 2015-12-29 ENCOUNTER — Ambulatory Visit (INDEPENDENT_AMBULATORY_CARE_PROVIDER_SITE_OTHER): Payer: Medicare Other | Admitting: Urology

## 2015-12-29 VITALS — BP 133/64 | HR 86 | Ht 73.0 in | Wt 297.6 lb

## 2015-12-29 DIAGNOSIS — N401 Enlarged prostate with lower urinary tract symptoms: Secondary | ICD-10-CM | POA: Diagnosis not present

## 2015-12-29 DIAGNOSIS — N528 Other male erectile dysfunction: Secondary | ICD-10-CM | POA: Diagnosis not present

## 2015-12-29 DIAGNOSIS — N529 Male erectile dysfunction, unspecified: Secondary | ICD-10-CM

## 2015-12-29 DIAGNOSIS — E291 Testicular hypofunction: Secondary | ICD-10-CM

## 2015-12-29 DIAGNOSIS — N138 Other obstructive and reflux uropathy: Secondary | ICD-10-CM

## 2015-12-29 NOTE — Progress Notes (Signed)
12/29/2015 11:35 AM   Lance [name] 06-29-1950 [id number]  Referring provider: Ezequiel Kayser, MD [facility] [place], [place] [address]  Chief Complaint  Patient presents with  . Hypogonadism    1 month follow up    HPI: Patient is a 65 year old Caucasian male who presents today for a 6 months follow up for hypogonadism, BPH with LUTS and ED.   Hypogonadism Patient is experiencing a decrease in libido, a lack of energy, a decrease in strength, a loss in height, a decreased enjoyment in life,  erections being less strong, a recent deterioration in an ability to play sports, falling asleep after dinner and a recent deterioration in their work performance.  This is indicated by his responses to the ADAM questionnaire.  He is no longer having spontaneous erections at night.   He has sleep apnea and is sleeping with a CPAP machine.  His current testosterone level is 657 ng/dL on 12/26/2015.   He is currently managing his hypogonadism with Testopel q 3 months.        Androgen Deficiency in the Aging Male    [place] Name 12/29/15 1100         Androgen Deficiency in the Aging Male   Do you have a decrease in libido (sex drive) Yes     Do you have lack of energy Yes     Do you have a decrease in strength and/or endurance Yes     Have you lost height Yes     Have you noticed a decreased "enjoyment of life" Yes     Are you sad and/or grumpy No     Are your erections less strong Yes     Have you noticed a recent deterioration in your ability to play sports Yes     Are you falling asleep after dinner Yes     Has there been a recent deterioration in your work performance Yes        BPH WITH LUTS His IPSS score today is 5, which is mild lower urinary tract symptomatology. He is mostly satisfied with his quality life due to his urinary symptoms.  His previous IPSS score was 15/3.   His major complaint today is intermittency, weak stream and straining to  urinate.  These are not bothersome to him.  He has had these symptoms for several years.  He denies any dysuria, hematuria or suprapubic pain.    His has had HoLEP on 08/28/2014.   He also denies any recent fevers, chills, nausea or vomiting.  He does not have a family history of PCa.      IPSS    Row Name 12/29/15 1100         International Prostate Symptom Score   How often have you had the sensation of not emptying your bladder? Not at All     How often have you had to urinate less than every two hours? Not at All     How often have you found you stopped and started again several times when you urinated? Less than half the time     How often have you found it difficult to postpone urination? Not at All     How often have you had a weak urinary stream? Less than 1 in 5 times     How often have you had to strain to start urination? Less than 1 in 5 times     How many  times did you typically get up at night to urinate? 1 Time     Total IPSS Score 5       Quality of Life due to urinary symptoms   If you were to spend the rest of your life with your urinary condition just the way it is now how would you feel about that? Mostly Satisfied        Score:  1-7 Mild 8-19 Moderate 20-35 Severe   Erectile dysfunction His SHIM score is 15, which is mild to moderate ED.   His previous SHIM score was 12.  He has been having difficulty with erections for several years.   His major complaint is lack of ejaculation fluid and lack of orgasm. Marland Kitchen  His libido is diminished.   His risk factors for ED are [name], BPH, DM and anticoagulation therapy.  He denies any painful erections or curvatures with his erections.   He has tried PDE5-inhibitors in the past, but he finds them cost prohibitive.         [place] Name 12/29/15 1114         SHIM: Over the last 6 months:   How do you rate your confidence that you could get and keep an erection? Moderate     When you had erections with sexual  stimulation, how often were your erections hard enough for penetration (entering your partner)? Sometimes (about half the time)     During sexual intercourse, how often were you able to maintain your erection after you had penetrated (entered) your partner? Slightly Difficult     During sexual intercourse, how difficult was it to maintain your erection to completion of intercourse? Very Difficult     When you attempted sexual intercourse, how often was it satisfactory for you? Difficult       SHIM Total Score   SHIM 15        Score: 1-7 Severe ED 8-11 Moderate ED 12-16 Mild-Moderate ED 17-21 Mild ED 22-25 No ED        PMH: Past Medical History:  Diagnosis Date  . Acute cystitis   . Allergic rhinitis   . BPH (benign prostatic hyperplasia)   . Carpal tunnel syndrome   . CHF (congestive heart failure) ([place])   . Complication of anesthesia    long time to wake  . DDD (degenerative disc disease), lumbar   . Degenerative lumbar spinal stenosis   . Diabetes mellitus ([place])   . Erectile dysfunction   . GERD (gastroesophageal reflux disease)   . Gout   . History of DVT (deep vein thrombosis)   . History of migraine headaches   . History of pulmonary embolism   . Hypercholesterolemia   . Hypercoagulable state ([place])   . Hypertension   . Hypogonadism in male   . Infection of skin   . Iron deficiency anemia   . Lower urinary tract symptoms   . Meralgia paresthetica   . Obesity   . Post-void dribbling   . Prostatitis   . Sleep apnea   . SNHL (sensorineural hearing loss)     Surgical History: Past Surgical History:  Procedure Laterality Date  . Blood vessel Surgery    . CARPAL TUNNEL RELEASE Bilateral 2011  . COLONOSCOPY WITH PROPOFOL N/A 07/11/2015   Procedure: COLONOSCOPY WITH PROPOFOL;  Surgeon: Lollie Sails, MD;  Location: [facility] ENDOSCOPY;  Service: Endoscopy;  Laterality: N/A;  . FOOT SURGERY Bilateral   . FOOT SURGERY Left 2003  complicated by DVT  .  [place]  . [place]   x2  . HOLEP-LASER ENUCLEATION OF THE PROSTATE WITH MORCELLATION  08/28/2014   Dr. Hollice Espy  . HYDROCELE EXCISION / REPAIR  1985   x2  . Hydrocelectomy     x 2  . LEG SURGERY Right 1974   fatty tumor  . REPLACEMENT TOTAL KNEE Left 2012  . SHOULDER SURGERY Right 1968  . TONSILLECTOMY  1958  . TOTAL SHOULDER ARTHROPLASTY Right 09/18/2014   arthroplasty, glenohumeral joint; total shoulder (glenoid and prosimal humeral replacement)  . VASECTOMY  1979    Home Medications:    Medication List       Accurate as of 12/29/15 11:35 AM. Always use your most recent med list.          B-D ULTRAFINE III SHORT PEN 31G X 8 MM Misc Generic drug:  Insulin Pen Needle once daily.   CIALIS 5 MG tablet Generic drug:  tadalafil Take by mouth.   clindamycin 150 MG capsule Commonly known as:  CLEOCIN   cyclobenzaprine 10 MG tablet Commonly known as:  FLEXERIL Take by mouth.   fluticasone 50 MCG/ACT nasal spray Commonly known as:  FLONASE Reported on 07/29/2015   HYDROcodone-acetaminophen 5-325 MG tablet Commonly known as:  NORCO/VICODIN 5-325 tablets. 1/2 to 1 tablet 3 times a day   lansoprazole 30 MG capsule Commonly known as:  PREVACID Take 30 mg by mouth. Reported on 07/29/2015   lisinopril-hydrochlorothiazide 10-12.5 MG tablet Commonly known as:  PRINZIDE,ZESTORETIC Take 1 tablet by mouth daily.   lisinopril-hydrochlorothiazide 20-12.5 MG tablet Commonly known as:  PRINZIDE,ZESTORETIC Take by mouth.   MULTI-VITAMINS Tabs Take by mouth.   MULTIVITAMIN ADULT PO Take by mouth.   NEURONTIN 300 MG capsule Generic drug:  gabapentin Take 300 mg by mouth.   ONE TOUCH ULTRA TEST test strip Generic drug:  glucose blood Use 2 (two) times daily. Use as instructed.   ONETOUCH DELICA LANCETS 99991111 Misc   pantoprazole 40 MG tablet Commonly known as:  PROTONIX   PROAIR HFA 108 (90 Base) MCG/ACT inhaler Generic drug:   albuterol Inhale into the lungs. Reported on 07/29/2015   RELISTOR 150 MG Tabs Generic drug:  Methylnaltrexone Bromide Take 450 mg by mouth every morning. Reported on 07/29/2015   sildenafil 20 MG tablet Commonly known as:  REVATIO Take 3 to 5 tablets two hours before intercouse on an empty stomach.  Do not take with nitrates.   tamsulosin 0.4 MG Caps capsule Commonly known as:  FLOMAX Take 1 capsule (0.4 mg total) by mouth daily.   VICTOZA 18 MG/3ML Sopn Generic drug:  Liraglutide Inject into the skin.   VICTOZA 18 MG/3ML Sopn Generic drug:  Liraglutide Inject into the skin.   warfarin 5 MG tablet Commonly known as:  COUMADIN Take by mouth.       Allergies:  Allergies  Allergen Reactions  . Penicillins     Other reaction(s): UNKNOWN    Family History: Family History  Problem Relation [name] of Onset  . Thrombosis    . Skin cancer Father   . Kidney disease Neg Hx   . Prostate cancer Neg Hx     Social History:  reports that he has never smoked. He does not have any smokeless tobacco history on file. He reports that he does not drink alcohol or use drugs.  ROS: UROLOGY Frequent Urination?: No Hard to postpone urination?: No Burning/pain with urination?: No Get  up at night to urinate?: No Leakage of urine?: No Urine stream starts and stops?: No Trouble starting stream?: No Do you have to strain to urinate?: No Blood in urine?: No Urinary tract infection?: No Sexually transmitted disease?: No Injury to kidneys or bladder?: No Painful intercourse?: No Weak stream?: No Erection problems?: No Penile pain?: No  Gastrointestinal Nausea?: No Vomiting?: No Indigestion/heartburn?: No Diarrhea?: No Constipation?: No  Constitutional Fever: No Night sweats?: No Weight loss?: No Fatigue?: No  Skin Skin rash/lesions?: No Itching?: No  Eyes Blurred vision?: No Double vision?: No  Ears/Nose/Throat Sore throat?: No Sinus problems?:  No  Hematologic/Lymphatic Swollen glands?: No Easy bruising?: No  Cardiovascular Leg swelling?: Yes Chest pain?: No  Respiratory Cough?: No Shortness of breath?: No  Endocrine Excessive thirst?: No  Musculoskeletal Back pain?: Yes Joint pain?: Yes  Neurological Headaches?: No Dizziness?: No  Psychologic Depression?: No Anxiety?: No  Physical Exam: BP 133/64   Pulse 86   Ht 6\' 1"  (1.854 m)   Wt 297 lb 9.6 oz (135 kg)   BMI 39.26 kg/m   Constitutional: Well nourished. Alert and oriented, No acute distress. HEENT: [place] AT, moist mucus membranes. Trachea midline, no masses. Cardiovascular: No clubbing, cyanosis, or edema. Respiratory: Normal respiratory effort, no increased work of breathing. GI: Abdomen is soft, non tender, non distended, no abdominal masses. Liver and spleen not palpable.  No hernias appreciated.  Stool sample for occult testing is not indicated.   GU: No CVA tenderness.  No bladder fullness or masses.  Patient with circumcised phallus.   Urethral meatus is patent.  No penile discharge. No penile lesions or rashes. Scrotum without lesions, cysts, rashes and/or edema.  Testicles are located scrotally bilaterally. No masses are appreciated in the testicles. Left and right epididymis are normal. Rectal: Patient with  normal sphincter tone. Anus and perineum without scarring or rashes. No rectal masses are appreciated. Prostate is approximately 55 grams, no nodules are appreciated. Seminal vesicles are normal. Skin: No rashes, bruises or suspicious lesions. Lymph: No cervical or inguinal adenopathy. Neurologic: Grossly intact, no focal deficits, moving all 4 extremities. Psychiatric: Normal mood and affect.  Laboratory Data: Lab Results  Component Value Date   WBC 9.7 07/11/2015   HGB 15.1 07/11/2015   HCT 44.5 12/26/2015   MCV 82.5 07/11/2015   PLT 198 07/11/2015    Lab Results  Component Value Date   CREATININE 0.89 08/19/2014    PSA is 0.4  ng/mL on 12/26/2015  Lab Results  Component Value Date   TESTOSTERONE 657 12/26/2015     Lab Results  Component Value Date   TSH 0.872 04/10/2015     Lab Results  Component Value Date   AST 26 04/10/2015   Lab Results  Component Value Date   ALT 29 04/10/2015     Assessment & Plan:    1. Hypogonadism:     -most recent testosterone level is 657 ng/dL on 12/26/2015  -continue Testopel insertion  -RTC in 2 months testosterone   2. BPH with LUTS  - IPSS score is 5/2, it is improving  - Continue conservative management, avoiding bladder irritants and timed voiding's  - RTC in 6 months for IPSS, PSA and exam, as testosterone therapy can cause prostate enlargement and worsen LUTS  3. Erectile dysfunction:     -SHIM score is 15  -continue Cialis  -RTC in 6 months for SHIM score and exam, as testosterone therapy can affect erections  Return in about 2 months (  around 02/28/2016) for testosterone only.  These notes generated with voice recognition software. I apologize for typographical errors.  Zara Council, [facility] [address] [place], [place] [address] [phone]

## 2016-02-25 ENCOUNTER — Other Ambulatory Visit: Payer: Self-pay

## 2016-02-25 DIAGNOSIS — E291 Testicular hypofunction: Secondary | ICD-10-CM

## 2016-02-26 ENCOUNTER — Other Ambulatory Visit: Payer: Medicare Other

## 2016-02-26 DIAGNOSIS — E291 Testicular hypofunction: Secondary | ICD-10-CM

## 2016-02-27 ENCOUNTER — Telehealth: Payer: Self-pay

## 2016-02-27 LAB — TESTOSTERONE: Testosterone: 328 ng/dL (ref 264–916)

## 2016-02-27 NOTE — Telephone Encounter (Signed)
Spoke with patient and he doe not need a new pa for Testopel. Patient appointment already made, patient aware.

## 2016-02-27 NOTE — Telephone Encounter (Signed)
Spoke with pt in reference to testosterone levels and testopel. Pt voiced understanding.  Pt was unsure if a PA needed to be completed again. Do you know?

## 2016-02-27 NOTE — Telephone Encounter (Signed)
-----   Message from Nori Riis, PA-C sent at 02/27/2016  8:10 AM EDT ----- Please notify the patient of his testosterone level and schedule him for Testopel in November.

## 2016-02-27 NOTE — Telephone Encounter (Signed)
I will look when I get back to the [place] office and I will call the patient.

## 2016-03-05 ENCOUNTER — Ambulatory Visit: Payer: Medicare Other | Admitting: Urology

## 2016-03-11 ENCOUNTER — Encounter: Payer: Self-pay | Admitting: Urology

## 2016-03-11 ENCOUNTER — Ambulatory Visit (INDEPENDENT_AMBULATORY_CARE_PROVIDER_SITE_OTHER): Payer: Medicare Other | Admitting: Urology

## 2016-03-11 VITALS — BP 113/69 | HR 62 | Ht 72.0 in | Wt 306.0 lb

## 2016-03-11 DIAGNOSIS — E291 Testicular hypofunction: Secondary | ICD-10-CM | POA: Diagnosis not present

## 2016-03-11 MED ORDER — TESTOSTERONE 75 MG IL PLLT
75.0000 mg | PELLET | Freq: Once | Status: AC
  Administered 2016-03-11: 75 mg

## 2016-03-11 NOTE — Progress Notes (Signed)
This is a 65 year old Caucasian male with hypogonadism and he is managed with Testopel. He presents today for Testopel insertion.  Patient is placed on the exam table in the left lateral jackknife position.  Identified upper outer quadrant of hip for insertion; prepped area with Betadine and injected 10 cc's of Lidocaine 1% with Epinephrine to anesthetize superficially and distally along trocar tract.  Made 3 mm incision using 15 blade of scalpel; trocar with sharp ended stylet was inserted into subcutaneous tissue in line with femur. Sharp stylet was withdrawn and 6 pellets were placed into trocar well. Testopel pellets advanced into tissue using blunt ended stylet. Trocar removed and incision closed using 6 Steri-Strips. Cleansed area to remove Betadine and covered Steri-Strips with outer Band-Aid.  Careful inspection of insertion is done and patient informed of post procedure instructions.  He will return in three month for testosterone, HCT, PSA to be drawn before appointment and an office visit.

## 2016-03-12 ENCOUNTER — Ambulatory Visit: Payer: Medicare Other | Admitting: Urology

## 2016-05-06 DIAGNOSIS — Z79891 Long term (current) use of opiate analgesic: Secondary | ICD-10-CM | POA: Insufficient documentation

## 2016-06-11 ENCOUNTER — Other Ambulatory Visit: Payer: Medicare Other

## 2016-06-11 ENCOUNTER — Other Ambulatory Visit: Payer: Self-pay

## 2016-06-11 DIAGNOSIS — N401 Enlarged prostate with lower urinary tract symptoms: Secondary | ICD-10-CM

## 2016-06-11 DIAGNOSIS — E291 Testicular hypofunction: Secondary | ICD-10-CM

## 2016-06-12 LAB — TESTOSTERONE: Testosterone: 414 ng/dL (ref 264–916)

## 2016-06-12 LAB — HEMATOCRIT: Hematocrit: 45.3 % (ref 37.5–51.0)

## 2016-06-12 LAB — PSA: PROSTATE SPECIFIC AG, SERUM: 0.4 ng/mL (ref 0.0–4.0)

## 2016-06-15 ENCOUNTER — Telehealth: Payer: Self-pay

## 2016-06-15 NOTE — Telephone Encounter (Signed)
LMOM

## 2016-06-15 NOTE — Telephone Encounter (Signed)
-----   Message from Nori Riis, PA-C sent at 06/14/2016  8:25 PM EST ----- Patient needs an office visit for his prostate exam.

## 2016-06-15 NOTE — Telephone Encounter (Signed)
Pt returned call. Angie made pt f/u appt.

## 2016-07-05 ENCOUNTER — Ambulatory Visit (INDEPENDENT_AMBULATORY_CARE_PROVIDER_SITE_OTHER): Payer: Medicare Other | Admitting: Urology

## 2016-07-05 ENCOUNTER — Encounter: Payer: Self-pay | Admitting: Urology

## 2016-07-05 VITALS — BP 120/69 | HR 70 | Ht 72.0 in | Wt 307.6 lb

## 2016-07-05 DIAGNOSIS — N529 Male erectile dysfunction, unspecified: Secondary | ICD-10-CM | POA: Diagnosis not present

## 2016-07-05 DIAGNOSIS — N401 Enlarged prostate with lower urinary tract symptoms: Secondary | ICD-10-CM | POA: Diagnosis not present

## 2016-07-05 DIAGNOSIS — N138 Other obstructive and reflux uropathy: Secondary | ICD-10-CM | POA: Diagnosis not present

## 2016-07-05 DIAGNOSIS — E291 Testicular hypofunction: Secondary | ICD-10-CM | POA: Diagnosis not present

## 2016-07-05 NOTE — Progress Notes (Signed)
07/05/2016 9:47 AM   Lance [name] 10-21-1950 [id number]  Referring provider: Ezequiel Kayser, MD [facility] [place], [place] [address]  Chief Complaint  Patient presents with  . Hypogonadism    3 month follow up     HPI: Patient is a 66 year old Caucasian male who presents today for a 6 months follow up for hypogonadism, BPH with LUTS and ED.    Hypogonadism Patient is experiencing a decrease in libido, a decrease in strength, a loss in height, a decreased enjoyment in life,  erections being less strong, a recent deterioration in an ability to play sports and a recent deterioration in their work performance.  This is indicated by his responses to the ADAM questionnaire.  He is no longer having spontaneous erections at night.   He has sleep apnea and is sleeping with a CPAP machine.  His current testosterone level is 414 ng/dL on 06/11/216.   He is currently managing his hypogonadism with Testopel q 3 months.       Androgen Deficiency in the Aging Male    Nina Name 07/05/16 0900         Androgen Deficiency in the Aging Male   Do you have a decrease in libido (sex drive) Yes     Do you have lack of energy No     Do you have a decrease in strength and/or endurance Yes     Have you lost height Yes     Have you noticed a decreased "enjoyment of life" Yes     Are you sad and/or grumpy No     Are your erections less strong Yes     Have you noticed a recent deterioration in your ability to play sports Yes     Are you falling asleep after dinner No     Has there been a recent deterioration in your work performance Yes        BPH WITH LUTS His IPSS score today is 13, which is moderate lower urinary tract symptomatology. He is mostly satisfied with his quality life due to his urinary symptoms.  His previous IPSS score was 5/2.   His major complaint today is intermittency.   These are not bothersome to him.  He has had these symptoms for several years.   He denies any dysuria, hematuria or suprapubic pain.    His has had HoLEP on 08/28/2014.   He also denies any recent fevers, chills, nausea or vomiting.  He does not have a family history of PCa.     IPSS    Row Name 07/05/16 0900         International Prostate Symptom Score   How often have you had the sensation of not emptying your bladder? Not at All     How often have you had to urinate less than every two hours? About half the time     How often have you found you stopped and started again several times when you urinated? About half the time     How often have you found it difficult to postpone urination? Less than 1 in 5 times     How often have you had a weak urinary stream? About half the time     How often have you had to strain to start urination? Less than half the time     How many times did you typically get up at night to urinate? 1 Time  Total IPSS Score 13       Quality of Life due to urinary symptoms   If you were to spend the rest of your life with your urinary condition just the way it is now how would you feel about that? Mostly Satisfied        Score:  1-7 Mild 8-19 Moderate 20-35 Severe   Erectile dysfunction His SHIM score is 14, which is mild to moderate ED.   His previous SHIM score was 15.  He has been having difficulty with erections for several years.   His major complaint is lack of ejaculation fluid and lack of orgasm.    His libido is diminished.   His risk factors for ED are [name], BPH, DM and anticoagulation therapy.  He denies any painful erections or curvatures with his erections.   He has tried PDE5-inhibitors in the past, but he finds them cost prohibitive.       SHIM    Row Name 07/05/16 0907         SHIM: Over the last 6 months:   How do you rate your confidence that you could get and keep an erection? Low     When you had erections with sexual stimulation, how often were your erections hard enough for penetration (entering your  partner)? A Few Times (much less than half the time)     During sexual intercourse, how often were you able to maintain your erection after you had penetrated (entered) your partner? Slightly Difficult     During sexual intercourse, how difficult was it to maintain your erection to completion of intercourse? Slightly Difficult     When you attempted sexual intercourse, how often was it satisfactory for you? Very Difficult       SHIM Total Score   SHIM 14        Score: 1-7 Severe ED 8-11 Moderate ED 12-16 Mild-Moderate ED 17-21 Mild ED 22-25 No ED  PMH: Past Medical History:  Diagnosis Date  . Acute cystitis   . Allergic rhinitis   . BPH (benign prostatic hyperplasia)   . Carpal tunnel syndrome   . CHF (congestive heart failure) ([place])   . Complication of anesthesia    long time to wake  . DDD (degenerative disc disease), lumbar   . Degenerative lumbar spinal stenosis   . Diabetes mellitus ([place])   . Erectile dysfunction   . GERD (gastroesophageal reflux disease)   . Gout   . History of DVT (deep vein thrombosis)   . History of migraine headaches   . History of pulmonary embolism   . Hypercholesterolemia   . Hypercoagulable state ([place])   . Hypertension   . Hypogonadism in male   . Infection of skin   . Iron deficiency anemia   . Lower urinary tract symptoms   . Meralgia paresthetica   . Obesity   . Post-void dribbling   . Prostatitis   . Sleep apnea   . SNHL (sensorineural hearing loss)     Surgical History: Past Surgical History:  Procedure Laterality Date  . Blood vessel Surgery    . CARPAL TUNNEL RELEASE Bilateral 2011  . COLONOSCOPY WITH PROPOFOL N/A 07/11/2015   Procedure: COLONOSCOPY WITH PROPOFOL;  Surgeon: Lollie Sails, MD;  Location: [facility] ENDOSCOPY;  Service: Endoscopy;  Laterality: N/A;  . FOOT SURGERY Bilateral   . FOOT SURGERY Left [id number]   complicated by DVT  . [place]  . [place]  x2  . HOLEP-LASER ENUCLEATION OF  THE PROSTATE WITH MORCELLATION  08/28/2014   Dr. Hollice Espy  . HYDROCELE EXCISION / REPAIR  1985   x2  . Hydrocelectomy     x 2  . LEG SURGERY Right 1974   fatty tumor  . REPLACEMENT TOTAL KNEE Left 2012  . SHOULDER SURGERY Right 1968  . TONSILLECTOMY  1958  . TOTAL SHOULDER ARTHROPLASTY Right 09/18/2014   arthroplasty, glenohumeral joint; total shoulder (glenoid and prosimal humeral replacement)  . VASECTOMY  1979    Home Medications:  Allergies as of 07/05/2016      Reactions   Penicillins    Other reaction(s): UNKNOWN      Medication List       Accurate as of 07/05/16  9:47 AM. Always use your most recent med list.          B-D ULTRAFINE III SHORT PEN 31G X 8 MM Misc Generic drug:  Insulin Pen Needle once daily.   CIALIS 5 MG tablet Generic drug:  tadalafil Take by mouth.   clindamycin 150 MG capsule Commonly known as:  CLEOCIN   cyclobenzaprine 10 MG tablet Commonly known as:  FLEXERIL Take by mouth.   fluticasone 50 MCG/ACT nasal spray Commonly known as:  FLONASE Reported on 07/29/2015   HYDROcodone-acetaminophen 5-325 MG tablet Commonly known as:  NORCO/VICODIN 5-325 tablets. 1/2 to 1 tablet 3 times a day   lansoprazole 30 MG capsule Commonly known as:  PREVACID Take 30 mg by mouth. Reported on 07/29/2015   lisinopril-hydrochlorothiazide 10-12.5 MG tablet Commonly known as:  PRINZIDE,ZESTORETIC Take 1 tablet by mouth daily.   lisinopril-hydrochlorothiazide 20-12.5 MG tablet Commonly known as:  PRINZIDE,ZESTORETIC Take by mouth.   MULTI-VITAMINS Tabs Take by mouth.   MULTIVITAMIN ADULT PO Take by mouth.   NEURONTIN 300 MG capsule Generic drug:  gabapentin Take 300 mg by mouth.   ONE TOUCH ULTRA TEST test strip Generic drug:  glucose blood Use 2 (two) times daily. Use as instructed.   ONETOUCH DELICA LANCETS 99991111 Misc   pantoprazole 40 MG tablet Commonly known as:  PROTONIX   predniSONE 10 MG tablet Commonly known as:   DELTASONE Take as directed - 12 day taper   PROAIR HFA 108 (90 Base) MCG/ACT inhaler Generic drug:  albuterol Inhale into the lungs. Reported on 07/29/2015   RELISTOR 150 MG Tabs Generic drug:  Methylnaltrexone Bromide Take 450 mg by mouth every morning. Reported on 07/29/2015   sildenafil 20 MG tablet Commonly known as:  REVATIO Take 3 to 5 tablets two hours before intercouse on an empty stomach.  Do not take with nitrates.   tamsulosin 0.4 MG Caps capsule Commonly known as:  FLOMAX Take 1 capsule (0.4 mg total) by mouth daily.   VICTOZA 18 MG/3ML Sopn Generic drug:  liraglutide Inject into the skin.   VICTOZA 18 MG/3ML Sopn Generic drug:  liraglutide Inject into the skin.   warfarin 5 MG tablet Commonly known as:  COUMADIN Take by mouth.       Allergies:  Allergies  Allergen Reactions  . Penicillins     Other reaction(s): UNKNOWN    Family History: Family History  Problem Relation [name] of Onset  . Thrombosis    . Skin cancer Father   . Kidney disease Neg Hx   . Prostate cancer Neg Hx   . Kidney cancer Neg Hx     Social History:  reports that he has never smoked. He has never used smokeless tobacco.  He reports that he does not drink alcohol or use drugs.  ROS: UROLOGY Frequent Urination?: No Hard to postpone urination?: No Burning/pain with urination?: No Get up at night to urinate?: No Leakage of urine?: No Urine stream starts and stops?: Yes Trouble starting stream?: No Do you have to strain to urinate?: No Blood in urine?: No Urinary tract infection?: No Sexually transmitted disease?: No Injury to kidneys or bladder?: No Painful intercourse?: No Weak stream?: No Erection problems?: Yes Penile pain?: No  Gastrointestinal Nausea?: No Vomiting?: No Indigestion/heartburn?: No Diarrhea?: No Constipation?: Yes  Constitutional Fever: No Night sweats?: No Weight loss?: No Fatigue?: No  Skin Skin rash/lesions?: No Itching?:  No  Eyes Blurred vision?: No Double vision?: No  Ears/Nose/Throat Sore throat?: No Sinus problems?: No  Hematologic/Lymphatic Swollen glands?: No Easy bruising?: No  Cardiovascular Leg swelling?: Yes Chest pain?: No  Respiratory Cough?: No Shortness of breath?: No  Endocrine Excessive thirst?: No  Musculoskeletal Back pain?: Yes Joint pain?: Yes  Neurological Headaches?: No Dizziness?: No  Psychologic Depression?: No Anxiety?: No  Physical Exam: BP 120/69   Pulse 70   Ht 6' (1.829 m)   Wt (!) 307 lb 9.6 oz (139.5 kg)   BMI 41.72 kg/m   Constitutional: Well nourished. Alert and oriented, No acute distress. HEENT: [place] AT, moist mucus membranes. Trachea midline, no masses. Cardiovascular: No clubbing, cyanosis, or edema. Respiratory: Normal respiratory effort, no increased work of breathing. GI: Abdomen is soft, non tender, non distended, no abdominal masses. Liver and spleen not palpable.  No hernias appreciated.  Stool sample for occult testing is not indicated.   GU: No CVA tenderness.  No bladder fullness or masses.  Patient with circumcised phallus.   Urethral meatus is patent.  No penile discharge. No penile lesions or rashes. Scrotum without lesions, cysts, rashes and/or edema.  Testicles are located scrotally bilaterally. No masses are appreciated in the testicles. Left and right epididymis are normal. Rectal: Patient with  normal sphincter tone. Anus and perineum without scarring or rashes. No rectal masses are appreciated. Prostate is approximately 55 grams, no nodules are appreciated. Seminal vesicles are normal. Skin: No rashes, bruises or suspicious lesions. Lymph: No cervical or inguinal adenopathy. Neurologic: Grossly intact, no focal deficits, moving all 4 extremities. Psychiatric: Normal mood and affect.  Laboratory Data: Lab Results  Component Value Date   WBC 9.7 07/11/2015   HGB 15.1 07/11/2015   HCT 45.3 06/11/2016   MCV 82.5 07/11/2015    PLT 198 07/11/2015    Lab Results  Component Value Date   CREATININE 0.89 08/19/2014    PSA History  0.4 ng/mL on 12/26/2015  0.4 ng/mL on 06/11/2016  Lab Results  Component Value Date   TESTOSTERONE 414 06/11/2016     Lab Results  Component Value Date   TSH 0.872 04/10/2015     Lab Results  Component Value Date   AST 26 04/10/2015   Lab Results  Component Value Date   ALT 29 04/10/2015     Assessment & Plan:    1. Hypogonadism:     -most recent testosterone level is 414 ng/dL on 06/11/2016  -continue Testopel insertion  -RTC in 3 months testosterone and HCT level only  -RTC in 6 months for ADAM, testosterone, HCT, LFT's and exam   2. BPH with LUTS  - IPSS score is 13/2, it is worse  - Continue conservative management, avoiding bladder irritants and timed voiding's  - RTC in 6 months for IPSS, PSA  and exam, as testosterone therapy can cause prostate enlargement and worsen LUTS  3. Erectile dysfunction:     -SHIM score is 14  -continue Cialis  -RTC in 6 months for SHIM score and exam, as testosterone therapy can affect erections  Return in about 3 months (around 10/02/2016) for HCT and testosterone only.  These notes generated with voice recognition software. I apologize for typographical errors.  Zara Council, [facility] [address], [place], [place] [address] [phone]

## 2016-07-06 ENCOUNTER — Telehealth: Payer: Self-pay | Admitting: *Deleted

## 2016-07-06 NOTE — Telephone Encounter (Signed)
Spoke with patient and he states Larene [place] states he does not need a Testopel right at this time. I let him know that I did receive his approval and he can call and make an appointment when he needs to. Patient ok with the plan.

## 2016-10-05 ENCOUNTER — Other Ambulatory Visit: Payer: Self-pay

## 2016-10-05 ENCOUNTER — Other Ambulatory Visit: Payer: Medicare Other

## 2016-10-05 DIAGNOSIS — E291 Testicular hypofunction: Secondary | ICD-10-CM

## 2016-10-06 ENCOUNTER — Telehealth: Payer: Self-pay

## 2016-10-06 LAB — TESTOSTERONE: TESTOSTERONE: 82 ng/dL — AB (ref 264–916)

## 2016-10-06 LAB — HEMATOCRIT: HEMATOCRIT: 42.3 % (ref 37.5–51.0)

## 2016-10-06 NOTE — Telephone Encounter (Signed)
-----   Message from Nori Riis, PA-C sent at 10/06/2016  7:40 AM EDT ----- Patient's testosterone level is low.  He needs to be scheduled for a Testopel insertion.

## 2016-10-06 NOTE — Telephone Encounter (Signed)
LMOM

## 2016-10-07 ENCOUNTER — Encounter: Payer: Self-pay | Admitting: Urology

## 2016-10-07 ENCOUNTER — Ambulatory Visit (INDEPENDENT_AMBULATORY_CARE_PROVIDER_SITE_OTHER): Payer: Medicare Other | Admitting: Urology

## 2016-10-07 VITALS — BP 115/69 | HR 96 | Ht 73.0 in | Wt 306.3 lb

## 2016-10-07 DIAGNOSIS — E349 Endocrine disorder, unspecified: Secondary | ICD-10-CM | POA: Diagnosis not present

## 2016-10-07 DIAGNOSIS — N522 Drug-induced erectile dysfunction: Secondary | ICD-10-CM | POA: Diagnosis not present

## 2016-10-07 MED ORDER — TESTOSTERONE 75 MG IL PLLT
75.0000 mg | PELLET | Freq: Once | Status: AC
  Administered 2016-10-07: 75 mg

## 2016-10-07 NOTE — Progress Notes (Signed)
10/07/2016 2:04 PM   Lance [name] 67-Mar-1952 [id number]  Referring provider: Ezequiel Kayser, MD [facility] [place], [place] [address]  Chief Complaint  Patient presents with  . Hypogonadism    Testopel    HPI: Patient is a 66 year old Caucasian male who presents today for a Testopel insertion.     Testosterone deficiency Patient is experiencing a decrease in libido, a decrease in strength, a loss in height, a decreased enjoyment in life,  erections being less strong, a recent deterioration in an ability to play sports and a recent deterioration in their work performance.  This is indicated by his responses to the ADAM questionnaire.  He is no longer having spontaneous erections at night.   He has sleep apnea and is sleeping with a CPAP machine.  His current testosterone level is 82 ng/dL on 10/05/2016.   He is currently managing his hypogonadism with Testopel q 3 months.      PMH: Past Medical History:  Diagnosis Date  . Acute cystitis   . Allergic rhinitis   . BPH (benign prostatic hyperplasia)   . Carpal tunnel syndrome   . CHF (congestive heart failure) ([place])   . Complication of anesthesia    long time to wake  . DDD (degenerative disc disease), lumbar   . Degenerative lumbar spinal stenosis   . Diabetes mellitus ([place])   . Erectile dysfunction   . GERD (gastroesophageal reflux disease)   . Gout   . History of DVT (deep vein thrombosis)   . History of migraine headaches   . History of pulmonary embolism   . Hypercholesterolemia   . Hypercoagulable state ([place])   . Hypertension   . Hypogonadism in male   . Infection of skin   . Iron deficiency anemia   . Lower urinary tract symptoms   . Meralgia paresthetica   . Obesity   . Post-void dribbling   . Prostatitis   . Sleep apnea   . SNHL (sensorineural hearing loss)     Surgical History: Past Surgical History:  Procedure Laterality Date  . Blood vessel Surgery    . CARPAL  TUNNEL RELEASE Bilateral 2011  . COLONOSCOPY WITH PROPOFOL N/A 07/11/2015   Procedure: COLONOSCOPY WITH PROPOFOL;  Surgeon: Lollie Sails, MD;  Location: [facility] ENDOSCOPY;  Service: Endoscopy;  Laterality: N/A;  . FOOT SURGERY Bilateral   . FOOT SURGERY Left 1062   complicated by DVT  . [place]  . [place]   x2  . HOLEP-LASER ENUCLEATION OF THE PROSTATE WITH MORCELLATION  08/28/2014   Dr. Hollice Espy  . HYDROCELE EXCISION / REPAIR  1985   x2  . Hydrocelectomy     x 2  . LEG SURGERY Right 1974   fatty tumor  . REPLACEMENT TOTAL KNEE Left 2012  . SHOULDER SURGERY Right 1968  . TONSILLECTOMY  1958  . TOTAL SHOULDER ARTHROPLASTY Right 09/18/2014   arthroplasty, glenohumeral joint; total shoulder (glenoid and prosimal humeral replacement)  . VASECTOMY  1979    Home Medications:  Allergies as of 10/07/2016      Reactions   Penicillins    Other reaction(s): UNKNOWN      Medication List       Accurate as of 10/07/16  2:04 PM. Always use your most recent med list.          B-D ULTRAFINE III SHORT PEN 31G X 8 MM Misc Generic drug:  Insulin Pen Needle once daily.   CIALIS 5 MG tablet Generic drug:  tadalafil Take by mouth.   clindamycin 150 MG capsule Commonly known as:  CLEOCIN   cyclobenzaprine 10 MG tablet Commonly known as:  FLEXERIL Take by mouth.   fluticasone 50 MCG/ACT nasal spray Commonly known as:  FLONASE Reported on 07/29/2015   HYDROcodone-acetaminophen 5-325 MG tablet Commonly known as:  NORCO/VICODIN 5-325 tablets. 1/2 to 1 tablet 3 times a day   lansoprazole 30 MG capsule Commonly known as:  PREVACID Take 30 mg by mouth. Reported on 07/29/2015   lisinopril-hydrochlorothiazide 10-12.5 MG tablet Commonly known as:  PRINZIDE,ZESTORETIC Take 1 tablet by mouth daily.   lisinopril-hydrochlorothiazide 20-12.5 MG tablet Commonly known as:  PRINZIDE,ZESTORETIC Take by mouth.   MULTI-VITAMINS Tabs Take by mouth.     MULTIVITAMIN ADULT PO Take by mouth.   NEURONTIN 300 MG capsule Generic drug:  gabapentin Take 300 mg by mouth.   ONE TOUCH ULTRA TEST test strip Generic drug:  glucose blood Use 2 (two) times daily. Use as instructed.   ONETOUCH DELICA LANCETS 42A Misc   pantoprazole 40 MG tablet Commonly known as:  PROTONIX   predniSONE 10 MG tablet Commonly known as:  DELTASONE Take as directed - 12 day taper   PROAIR HFA 108 (90 Base) MCG/ACT inhaler Generic drug:  albuterol Inhale into the lungs. Reported on 07/29/2015   RELISTOR 150 MG Tabs Generic drug:  Methylnaltrexone Bromide Take 450 mg by mouth every morning. Reported on 07/29/2015   sildenafil 20 MG tablet Commonly known as:  REVATIO Take 3 to 5 tablets two hours before intercouse on an empty stomach.  Do not take with nitrates.   tamsulosin 0.4 MG Caps capsule Commonly known as:  FLOMAX Take 1 capsule (0.4 mg total) by mouth daily.   VICTOZA 18 MG/3ML Sopn Generic drug:  liraglutide Inject into the skin.   VICTOZA 18 MG/3ML Sopn Generic drug:  liraglutide Inject into the skin.   warfarin 5 MG tablet Commonly known as:  COUMADIN Take by mouth.       Allergies:  Allergies  Allergen Reactions  . Penicillins     Other reaction(s): UNKNOWN    Family History: Family History  Problem Relation [name] of Onset  . Thrombosis Unknown   . Skin cancer Father   . Kidney disease Neg Hx   . Prostate cancer Neg Hx   . Kidney cancer Neg Hx   . Bladder Cancer Neg Hx     Social History:  reports that he has never smoked. He has never used smokeless tobacco. He reports that he does not drink alcohol or use drugs.  ROS: UROLOGY Frequent Urination?: No Hard to postpone urination?: No Burning/pain with urination?: No Get up at night to urinate?: No Leakage of urine?: No Urine stream starts and stops?: Yes Trouble starting stream?: No Do you have to strain to urinate?: No Blood in urine?: No Urinary tract  infection?: No Sexually transmitted disease?: No Injury to kidneys or bladder?: No Painful intercourse?: No Weak stream?: No Erection problems?: Yes Penile pain?: No  Gastrointestinal Nausea?: No Vomiting?: No Indigestion/heartburn?: No Diarrhea?: No Constipation?: Yes  Constitutional Fever: No Night sweats?: No Weight loss?: No Fatigue?: No  Skin Skin rash/lesions?: No Itching?: No  Eyes Blurred vision?: Yes Double vision?: No  Ears/Nose/Throat Sore throat?: No Sinus problems?: Yes  Hematologic/Lymphatic Swollen glands?: No Easy bruising?: No  Cardiovascular Leg swelling?: Yes Chest pain?: No  Respiratory Cough?: No Shortness of breath?:  Yes  Endocrine Excessive thirst?: No  Musculoskeletal Back pain?: Yes Joint pain?: Yes  Neurological Headaches?: No Dizziness?: No  Psychologic Depression?: No Anxiety?: No  Physical Exam: BP 115/69   Pulse 96   Ht 6\' 1"  (1.854 m)   Wt (!) 306 lb 4.8 oz (138.9 kg)   BMI 40.41 kg/m   Constitutional: Well nourished. Alert and oriented, No acute distress. HEENT: [place] AT, moist mucus membranes. Trachea midline, no masses. Cardiovascular: No clubbing, cyanosis, or edema. Respiratory: Normal respiratory effort, no increased work of breathing. Skin: No rashes, bruises or suspicious lesions. Lymph: No cervical or inguinal adenopathy. Neurologic: Grossly intact, no focal deficits, moving all 4 extremities. Psychiatric: Normal mood and affect.  Laboratory Data: Lab Results  Component Value Date   WBC 9.7 07/11/2015   HGB 15.1 07/11/2015   HCT 42.3 10/05/2016   MCV 82.5 07/11/2015   PLT 198 07/11/2015    Lab Results  Component Value Date   CREATININE 0.89 08/19/2014    PSA History  0.4 ng/mL on 12/26/2015  0.4 ng/mL on 06/11/2016  Lab Results  Component Value Date   TESTOSTERONE 82 (L) 10/05/2016     Lab Results  Component Value Date   TSH 0.872 04/10/2015     Lab Results  Component  Value Date   AST 26 04/10/2015   Lab Results  Component Value Date   ALT 29 04/10/2015    Procedure Patient is placed on the exam table in the right lateral jackknife position.  Identified upper outer quadrant of hip for insertion; prepped area with Betadine and injected 10 cc's of Lidocaine 1% with Epinephrine to anesthetize superficially and distally along trocar tract.  Made 3 mm incision using 15 blade of scalpel; trocar with sharp ended stylet was inserted into subcutaneous tissue in line with femur. Sharp stylet was withdrawn and 6 pellets were placed into trocar well. Testopel pellets advanced into tissue using blunt ended stylet. Trocar removed and incision closed using 6 Steri-Strips. Cleansed area to remove Betadine and covered Steri-Strips with outer Band-Aid.  Careful inspection of insertion is done and patient informed of post procedure instructions.  Assessment & Plan:    1. Testosterone deficiency  -most recent testosterone level is 82 ng/dL on 10/05/2016  -Testopel insertion today  -RTC in 3 months for testosterone, HBG,  HCT and ADAM    2. BPH with LUTS  - RTC in 3 months for IPSS, PSA and exam, as testosterone therapy can cause prostate enlargement and worsen LUTS  3. Erectile dysfunction:     -RTC in 3 months for SHIM score and exam, as testosterone therapy can affect erections  Return in about 3 months (around 01/07/2017) for ADAM, IPSS, SHIM and exam, PSA, HBG, HCT, testosterone .  These notes generated with voice recognition software. I apologize for typographical errors.  Zara Council, [facility] [address] [place], [place] [address] [phone]

## 2016-10-07 NOTE — Telephone Encounter (Signed)
Pt was added to shannon schedule for testopel today.

## 2016-10-07 NOTE — Addendum Note (Signed)
Addended by: Orlene Erm on: 10/07/2016 02:49 PM   Modules accepted: Orders

## 2016-10-28 DIAGNOSIS — Z6841 Body Mass Index (BMI) 40.0 and over, adult: Secondary | ICD-10-CM | POA: Insufficient documentation

## 2017-01-04 ENCOUNTER — Other Ambulatory Visit: Payer: Self-pay

## 2017-01-04 ENCOUNTER — Other Ambulatory Visit: Payer: Medicare Other

## 2017-01-04 DIAGNOSIS — E291 Testicular hypofunction: Secondary | ICD-10-CM

## 2017-01-04 DIAGNOSIS — N401 Enlarged prostate with lower urinary tract symptoms: Secondary | ICD-10-CM

## 2017-01-05 LAB — HEMATOCRIT: HEMATOCRIT: 46.9 % (ref 37.5–51.0)

## 2017-01-05 LAB — TESTOSTERONE: TESTOSTERONE: 152 ng/dL — AB (ref 264–916)

## 2017-01-05 LAB — HEMOGLOBIN: HEMOGLOBIN: 15.1 g/dL (ref 13.0–17.7)

## 2017-01-05 LAB — PSA: Prostate Specific Ag, Serum: 0.5 ng/mL (ref 0.0–4.0)

## 2017-01-08 NOTE — Progress Notes (Signed)
01/11/2017 9:04 AM   Star Age [birth year] [id number]  Referring provider: Ezequiel Kayser, MD [facility] [place], [place] [address]  Chief Complaint  Patient presents with  . Hypogonadism    3 month follow up  . Benign Prostatic Hypertrophy    HPI: Patient is a 66 year old diabetic Caucasian male with a history of testosterone deficiency, BPH with L UTS and erectile dysfunction who presents today for a 3 months follow up.     Testosterone deficiency Patient is experiencing a decrease in libido, a lack of energy, a decrease in strength, a loss in height, a decreased enjoyment in life,  erections being less strong and a recent deterioration in their work performance.  This is indicated by his responses to the ADAM questionnaire.  He is no longer having spontaneous erections at night.   He has sleep apnea and is sleeping with a CPAP machine.  His current testosterone level is 152 ng/dL on 01/04/2017.   His HBG and HCT are normal.  He is currently managing his hypogonadism with Testopel q 3 months.       Androgen Deficiency in the Aging Male    [place] Name 01/11/17 0800         Androgen Deficiency in the Aging Male   Do you have a decrease in libido (sex drive) Yes     Do you have lack of energy Yes     Do you have a decrease in strength and/or endurance Yes     Have you lost height Yes     Have you noticed a decreased "enjoyment of life" Yes     Are you sad and/or grumpy No     Are your erections less strong Yes     Have you noticed a recent deterioration in your ability to play sports No     Are you falling asleep after dinner Yes     Has there been a recent deterioration in your work performance No          BPH WITH LUTS  (prostate and/or bladder) His IPSS score today is 13, which is moderate lower urinary tract symptomatology. He is mostly satisfied with his quality life due to his urinary symptoms.  His previous IPSS score was 13/2.    His major complaints today are frequency and intermittency .  He has had these symptoms for several years.  He denies any dysuria, hematuria or suprapubic pain.   He currently taking tamsulosin 0.4 mg daily, but he has been without the medication for 3 weeks.    His has had HoLEP on 08/28/2014.      He also denies any recent fevers, chills, nausea or vomiting.  He does not have a family history of PCa.      IPSS    [place] Name 01/11/17 0800         International Prostate Symptom Score   How often have you had the sensation of not emptying your bladder? Less than 1 in 5     How often have you had to urinate less than every two hours? About half the time     How often have you found you stopped and started again several times when you urinated? About half the time     How often have you found it difficult to postpone urination? Less than 1 in 5 times     How often have you had a weak urinary stream? Less  than half the time     How often have you had to strain to start urination? Less than half the time     How many times did you typically get up at night to urinate? 1 Time     Total IPSS Score 13       Quality of Life due to urinary symptoms   If you were to spend the rest of your life with your urinary condition just the way it is now how would you feel about that? Mostly Satisfied        Score:  1-7 Mild 8-19 Moderate 20-35 Severe    Erectile dysfunction His SHIM score is 13, which is mild to moderate ED.   His previous SHIM score was 14.  He has been having difficulty with erections for several years. His major complaint is no erections without PDE-5 inhibitors.  His libido is diminished.   His risk factors for ED are age, BPH, testosterone deficiency, DM, HTN, HLD, spinal injury, COPD, anticoagulation therapy and sleep apnea.  He denies any painful erections or curvatures with his erections.   He is still having/no longer having spontaneous erections.  He has tried  sildenafil in the past and it is effective.         [place] Name 01/11/17 [phone]         SHIM: Over the last 6 months:   How do you rate your confidence that you could get and keep an erection? Low     When you had erections with sexual stimulation, how often were your erections hard enough for penetration (entering your partner)? Sometimes (about half the time)     During sexual intercourse, how often were you able to maintain your erection after you had penetrated (entered) your partner? Sometimes (about half the time)     During sexual intercourse, how difficult was it to maintain your erection to completion of intercourse? Slightly Difficult     When you attempted sexual intercourse, how often was it satisfactory for you? Almost Never or Never       SHIM Total Score   SHIM 13        Score: 1-7 Severe ED 8-11 Moderate ED 12-16 Mild-Moderate ED 17-21 Mild ED 22-25 No ED     PMH: Past Medical History:  Diagnosis Date  . Acute cystitis   . Allergic rhinitis   . BPH (benign prostatic hyperplasia)   . Carpal tunnel syndrome   . CHF (congestive heart failure) ([place])   . Complication of anesthesia    long time to wake  . DDD (degenerative disc disease), lumbar   . Degenerative lumbar spinal stenosis   . Diabetes mellitus ([place])   . Erectile dysfunction   . GERD (gastroesophageal reflux disease)   . Gout   . History of DVT (deep vein thrombosis)   . History of migraine headaches   . History of pulmonary embolism   . Hypercholesterolemia   . Hypercoagulable state ([place])   . Hypertension   . Hypogonadism in male   . Infection of skin   . Iron deficiency anemia   . Lower urinary tract symptoms   . Meralgia paresthetica   . Obesity   . Post-void dribbling   . Prostatitis   . Sleep apnea   . SNHL (sensorineural hearing loss)     Surgical History: Past Surgical History:  Procedure Laterality Date  . Blood vessel Surgery    . CARPAL TUNNEL RELEASE  Bilateral  2011  . COLONOSCOPY WITH PROPOFOL N/A 07/11/2015   Procedure: COLONOSCOPY WITH PROPOFOL;  Surgeon: Lollie Sails, MD;  Location: [facility] ENDOSCOPY;  Service: Endoscopy;  Laterality: N/A;  . FOOT SURGERY Bilateral   . FOOT SURGERY Left 2094   complicated by DVT  . [place]  . [place]   x2  . HOLEP-LASER ENUCLEATION OF THE PROSTATE WITH MORCELLATION  08/28/2014   Dr. Hollice Espy  . HYDROCELE EXCISION / REPAIR  1985   x2  . Hydrocelectomy     x 2  . LEG SURGERY Right 1974   fatty tumor  . REPLACEMENT TOTAL KNEE Left 2012  . SHOULDER SURGERY Right 1968  . TONSILLECTOMY  1958  . TOTAL SHOULDER ARTHROPLASTY Right 09/18/2014   arthroplasty, glenohumeral joint; total shoulder (glenoid and prosimal humeral replacement)  . VASECTOMY  1979    Home Medications:  Allergies as of 01/11/2017      Reactions   Penicillins    Other reaction(s): UNKNOWN      Medication List       Accurate as of 01/11/17  9:04 AM. Always use your most recent med list.          B-D ULTRAFINE III SHORT PEN 31G X 8 MM Misc Generic drug:  Insulin Pen Needle once daily.   cetirizine 10 MG tablet Commonly known as:  ZYRTEC Take by mouth.   clindamycin 150 MG capsule Commonly known as:  CLEOCIN   cyclobenzaprine 10 MG tablet Commonly known as:  FLEXERIL Take by mouth.   fluticasone 50 MCG/ACT nasal spray Commonly known as:  FLONASE Reported on 07/29/2015   HYDROcodone-acetaminophen 5-325 MG tablet Commonly known as:  NORCO/VICODIN 5-325 tablets. 1/2 to 1 tablet 3 times a day   lansoprazole 30 MG capsule Commonly known as:  PREVACID Take 30 mg by mouth. Reported on 07/29/2015   lisinopril-hydrochlorothiazide 10-12.5 MG tablet Commonly known as:  PRINZIDE,ZESTORETIC Take 1 tablet by mouth daily.   lisinopril-hydrochlorothiazide 20-12.5 MG tablet Commonly known as:  PRINZIDE,ZESTORETIC Take by mouth.   MULTI-VITAMINS Tabs Take by mouth.   MULTIVITAMIN ADULT  PO Take by mouth.   NEURONTIN 300 MG capsule Generic drug:  gabapentin Take 300 mg by mouth.   ONE TOUCH ULTRA TEST test strip Generic drug:  glucose blood Use 2 (two) times daily. Use as instructed.   ONETOUCH DELICA LANCETS 70J Misc   pantoprazole 40 MG tablet Commonly known as:  PROTONIX   predniSONE 10 MG tablet Commonly known as:  DELTASONE Take as directed - 12 day taper   PROAIR HFA 108 (90 Base) MCG/ACT inhaler Generic drug:  albuterol Inhale into the lungs. Reported on 07/29/2015   RELISTOR 150 MG Tabs Generic drug:  Methylnaltrexone Bromide Take 450 mg by mouth every morning. Reported on 07/29/2015   sildenafil 20 MG tablet Commonly known as:  REVATIO Take 3 to 5 tablets two hours before intercouse on an empty stomach.  Do not take with nitrates.   tamsulosin 0.4 MG Caps capsule Commonly known as:  FLOMAX Take 1 capsule (0.4 mg total) by mouth daily.   Testosterone 75 MG Pllt Inject into the skin.   VICTOZA 18 MG/3ML Sopn Generic drug:  liraglutide Inject into the skin.   VICTOZA 18 MG/3ML Sopn Generic drug:  liraglutide Inject into the skin.   warfarin 5 MG tablet Commonly known as:  COUMADIN Take by mouth.            Discharge  Care Instructions        Start     Ordered   01/11/17 0000  tamsulosin (FLOMAX) 0.4 MG CAPS capsule  Daily    Question:  Supervising Provider  Answer:  Hollice Espy   01/11/17 0900   01/11/17 0000  sildenafil (REVATIO) 20 MG tablet    Question:  Supervising Provider  Answer:  Hollice Espy   01/11/17 0901      Allergies:  Allergies  Allergen Reactions  . Penicillins     Other reaction(s): UNKNOWN    Family History: Family History  Problem Relation Age of Onset  . Thrombosis Unknown   . Skin cancer Father   . Kidney disease Neg Hx   . Prostate cancer Neg Hx   . Kidney cancer Neg Hx   . Bladder Cancer Neg Hx     Social History:  reports that he has never smoked. He has never used smokeless  tobacco. He reports that he does not drink alcohol or use drugs.  ROS: UROLOGY Frequent Urination?: No Hard to postpone urination?: No Burning/pain with urination?: No Get up at night to urinate?: No Leakage of urine?: No Urine stream starts and stops?: No Trouble starting stream?: No Do you have to strain to urinate?: No Blood in urine?: No Urinary tract infection?: No Sexually transmitted disease?: No Injury to kidneys or bladder?: No Painful intercourse?: No Weak stream?: No Erection problems?: No Penile pain?: No  Gastrointestinal Nausea?: No Vomiting?: No Indigestion/heartburn?: No Diarrhea?: No Constipation?: No  Constitutional Fever: No Night sweats?: No Weight loss?: No Fatigue?: No  Skin Skin rash/lesions?: No Itching?: No  Eyes Blurred vision?: No Double vision?: No  Ears/Nose/Throat Sore throat?: No Sinus problems?: No  Hematologic/Lymphatic Swollen glands?: No Easy bruising?: No  Cardiovascular Leg swelling?: Yes Chest pain?: No  Respiratory Cough?: No Shortness of breath?: No  Endocrine Excessive thirst?: No  Musculoskeletal Back pain?: Yes Joint pain?: Yes  Neurological Headaches?: No Dizziness?: No  Psychologic Depression?: No Anxiety?: No  Physical Exam: BP 116/70   Pulse 82   Ht 6' (1.829 m)   Wt (!) 311 lb 3.2 oz (141.2 kg)   BMI 42.21 kg/m   Constitutional: Well nourished. Alert and oriented, No acute distress. HEENT: [place] AT, moist mucus membranes. Trachea midline, no masses. Cardiovascular: No clubbing, cyanosis, or edema. Respiratory: Normal respiratory effort, no increased work of breathing. GI: Abdomen is soft, non tender, non distended, no abdominal masses. Liver and spleen not palpable.  No hernias appreciated.  Stool sample for occult testing is not indicated.   GU: No CVA tenderness.  No bladder fullness or masses.  Patient with circumcised phallus.   Urethral meatus is patent.  No penile discharge. No  penile lesions or rashes. Scrotum without lesions, cysts, rashes and/or edema.  Testicles are located scrotally bilaterally. No masses are appreciated in the testicles. Left and right epididymis are normal. Rectal: Patient with  normal sphincter tone. Anus and perineum without scarring or rashes. No rectal masses are appreciated. Prostate is approximately 45 grams, no nodules are appreciated. Seminal vesicles are normal. Skin: No rashes, bruises or suspicious lesions. Lymph: No cervical or inguinal adenopathy. Neurologic: Grossly intact, no focal deficits, moving all 4 extremities. Psychiatric: Normal mood and affect.  Laboratory Data: Lab Results  Component Value Date   WBC 9.7 07/11/2015   HGB 15.1 01/04/2017   HCT 46.9 01/04/2017   MCV 82.5 07/11/2015   PLT 198 07/11/2015   PSA History  0.4 ng/mL on 12/26/2015  0.4 ng/mL on 06/11/2016  0.4 ng/mL on 01/04/2017  Lab Results  Component Value Date   TESTOSTERONE 152 (L) 01/04/2017   I have reviewed the labs   Assessment & Plan:    1. Testosterone deficiency   -most recent testosterone level is 152 ng/dL on 01/04/2017 (goal 450-600 ng/dL)  -continueTestopel insertion - RTC for insertion  -RTC in 6 months for HCT/HBG, testosterone, ADAM and exam  2. BPH with LUTS  - IPSS score is 13/2, it is stable  - Continue conservative management, avoiding bladder irritants and timed voiding's  - Continue tamsulosin 0.4 mg daily; refills given  - RTC in 6 months for IPSS, PSA, PVR and exam, as testosterone therapy can cause prostate enlargement and worsen LUTS  3. Erectile dysfunction:     -SHIM score is 13  -continue sildenafil 20 mg, 3-5 tablets 2 hours prior to intercourse on an empty stomach: refills given  -RTC in 6 months for SHIM score and exam, as testosterone therapy can affect erections   Return for Testopel .  These notes generated with voice recognition software. I apologize for typographical errors.  Zara Council,  [facility] [address] [place], [place] [address] [phone]

## 2017-01-11 ENCOUNTER — Encounter: Payer: Self-pay | Admitting: Urology

## 2017-01-11 ENCOUNTER — Ambulatory Visit (INDEPENDENT_AMBULATORY_CARE_PROVIDER_SITE_OTHER): Payer: Medicare Other | Admitting: Urology

## 2017-01-11 VITALS — BP 116/70 | HR 82 | Ht 72.0 in | Wt 311.2 lb

## 2017-01-11 DIAGNOSIS — E349 Endocrine disorder, unspecified: Secondary | ICD-10-CM

## 2017-01-11 DIAGNOSIS — N401 Enlarged prostate with lower urinary tract symptoms: Secondary | ICD-10-CM | POA: Diagnosis not present

## 2017-01-11 DIAGNOSIS — N529 Male erectile dysfunction, unspecified: Secondary | ICD-10-CM | POA: Diagnosis not present

## 2017-01-11 DIAGNOSIS — N138 Other obstructive and reflux uropathy: Secondary | ICD-10-CM | POA: Diagnosis not present

## 2017-01-11 MED ORDER — TAMSULOSIN HCL 0.4 MG PO CAPS: 0.4000 mg | ORAL_CAPSULE | Freq: Every day | ORAL | 3 refills | Status: DC

## 2017-01-11 MED ORDER — SILDENAFIL CITRATE 20 MG PO TABS: ORAL_TABLET | ORAL | 3 refills | Status: DC

## 2017-01-17 NOTE — Progress Notes (Signed)
01/18/2017 3:06 PM   Lance [name] [birth year] [id number]  Referring provider: Ezequiel Kayser, MD [facility] [place], [place] [address]  Chief Complaint  Patient presents with  . Procedure    Testopel    HPI: Patient is a 66 year old diabetic Caucasian male with a history of testosterone deficiency, BPH with L UTS and erectile dysfunction who presents today for a Testopel insertion     Testosterone deficiency Patient is experiencing a decrease in libido, a lack of energy, a decrease in strength, a loss in height, a decreased enjoyment in life,  erections being less strong and a recent deterioration in their work performance.  This is indicated by his responses to the ADAM questionnaire.  He is no longer having spontaneous erections at night.   He has sleep apnea and is sleeping with a CPAP machine.  His current testosterone level is 152 ng/dL on 01/04/2017.   His HBG and HCT are normal.  He is currently managing his hypogonadism with Testopel q 3 months.       Androgen Deficiency in the Aging Male    [place] Name 01/11/17 0800         Androgen Deficiency in the Aging Male   Do you have a decrease in libido (sex drive) Yes     Do you have lack of energy Yes     Do you have a decrease in strength and/or endurance Yes     Have you lost height Yes     Have you noticed a decreased "enjoyment of life" Yes     Are you sad and/or grumpy No     Are your erections less strong Yes     Have you noticed a recent deterioration in your ability to play sports No     Are you falling asleep after dinner Yes     Has there been a recent deterioration in your work performance No         PMH: Past Medical History:  Diagnosis Date  . Acute cystitis   . Allergic rhinitis   . BPH (benign prostatic hyperplasia)   . Carpal tunnel syndrome   . CHF (congestive heart failure) ([place])   . Complication of anesthesia    long time to wake  . DDD (degenerative disc  disease), lumbar   . Degenerative lumbar spinal stenosis   . Diabetes mellitus ([place])   . Erectile dysfunction   . GERD (gastroesophageal reflux disease)   . Gout   . History of DVT (deep vein thrombosis)   . History of migraine headaches   . History of pulmonary embolism   . Hypercholesterolemia   . Hypercoagulable state ([place])   . Hypertension   . Hypogonadism in male   . Infection of skin   . Iron deficiency anemia   . Lower urinary tract symptoms   . Meralgia paresthetica   . Obesity   . Post-void dribbling   . Prostatitis   . Sleep apnea   . SNHL (sensorineural hearing loss)     Surgical History: Past Surgical History:  Procedure Laterality Date  . Blood vessel Surgery    . CARPAL TUNNEL RELEASE Bilateral 2011  . COLONOSCOPY WITH PROPOFOL N/A 07/11/2015   Procedure: COLONOSCOPY WITH PROPOFOL;  Surgeon: Lollie Sails, MD;  Location[facility] ENDOSCOPY;  Service: Endoscopy;  Laterality: N/A;  . FOOT SURGERY Bilateral   . FOOT SURGERY Left 3614   complicated by DVT  . HEMORRHOID  SURGERY  1984  . [place]   x2  . HOLEP-LASER ENUCLEATION OF THE PROSTATE WITH MORCELLATION  08/28/2014   Dr. Hollice Espy  . HYDROCELE EXCISION / REPAIR  1985   x2  . Hydrocelectomy     x 2  . LEG SURGERY Right 1974   fatty tumor  . REPLACEMENT TOTAL KNEE Left 2012  . SHOULDER SURGERY Right 1968  . TONSILLECTOMY  1958  . TOTAL SHOULDER ARTHROPLASTY Right 09/18/2014   arthroplasty, glenohumeral joint; total shoulder (glenoid and prosimal humeral replacement)  . VASECTOMY  1979    Home Medications:  Allergies as of 01/18/2017      Reactions   Penicillins    Other reaction(s): UNKNOWN      Medication List       Accurate as of 01/18/17  3:06 PM. Always use your most recent med list.          B-D ULTRAFINE III SHORT PEN 31G X 8 MM Misc Generic drug:  Insulin Pen Needle once daily.   cetirizine 10 MG tablet Commonly known as:  ZYRTEC Take by mouth.   clindamycin  150 MG capsule Commonly known as:  CLEOCIN   cyclobenzaprine 10 MG tablet Commonly known as:  FLEXERIL Take by mouth.   fluticasone 50 MCG/ACT nasal spray Commonly known as:  FLONASE Reported on 07/29/2015   HYDROcodone-acetaminophen 5-325 MG tablet Commonly known as:  NORCO/VICODIN 5-325 tablets. 1/2 to 1 tablet 3 times a day   lansoprazole 30 MG capsule Commonly known as:  PREVACID Take 30 mg by mouth. Reported on 07/29/2015   lisinopril-hydrochlorothiazide 10-12.5 MG tablet Commonly known as:  PRINZIDE,ZESTORETIC Take 1 tablet by mouth daily.   lisinopril-hydrochlorothiazide 20-12.5 MG tablet Commonly known as:  PRINZIDE,ZESTORETIC Take by mouth.   MULTI-VITAMINS Tabs Take by mouth.   MULTIVITAMIN ADULT PO Take by mouth.   NEURONTIN 300 MG capsule Generic drug:  gabapentin Take 300 mg by mouth.   ONE TOUCH ULTRA TEST test strip Generic drug:  glucose blood Use 2 (two) times daily. Use as instructed.   ONETOUCH DELICA LANCETS 23J Misc   pantoprazole 40 MG tablet Commonly known as:  PROTONIX   predniSONE 10 MG tablet Commonly known as:  DELTASONE Take as directed - 12 day taper   PROAIR HFA 108 (90 Base) MCG/ACT inhaler Generic drug:  albuterol Inhale into the lungs. Reported on 07/29/2015   RELISTOR 150 MG Tabs Generic drug:  Methylnaltrexone Bromide Take 450 mg by mouth every morning. Reported on 07/29/2015   sildenafil 20 MG tablet Commonly known as:  REVATIO Take 3 to 5 tablets two hours before intercouse on an empty stomach.  Do not take with nitrates.   tamsulosin 0.4 MG Caps capsule Commonly known as:  FLOMAX Take 1 capsule (0.4 mg total) by mouth daily.   Testosterone 75 MG Pllt Inject into the skin.   VICTOZA 18 MG/3ML Sopn Generic drug:  liraglutide Inject into the skin.   VICTOZA 18 MG/3ML Sopn Generic drug:  liraglutide Inject into the skin.   warfarin 5 MG tablet Commonly known as:  COUMADIN Take by mouth.              Discharge Care Instructions        Start     Ordered   01/18/17 1430  TESTOSTERONE 75 MG IL PLLT   Once     01/18/17 1428      Allergies:  Allergies  Allergen Reactions  . Penicillins  Other reaction(s): UNKNOWN    Family History: Family History  Problem Relation [name] of Onset  . Thrombosis Unknown   . Skin cancer Father   . Kidney disease Neg Hx   . Prostate cancer Neg Hx   . Kidney cancer Neg Hx   . Bladder Cancer Neg Hx     Social History:  reports that he has never smoked. He has never used smokeless tobacco. He reports that he does not drink alcohol or use drugs.  ROS: UROLOGY Frequent Urination?: Yes Hard to postpone urination?: No Burning/pain with urination?: No Get up at night to urinate?: No Leakage of urine?: No Urine stream starts and stops?: Yes Trouble starting stream?: No Do you have to strain to urinate?: No Blood in urine?: No Urinary tract infection?: No Sexually transmitted disease?: No Injury to kidneys or bladder?: No Painful intercourse?: No Weak stream?: No Erection problems?: Yes Penile pain?: No  Gastrointestinal Nausea?: No Vomiting?: No Indigestion/heartburn?: No Diarrhea?: No Constipation?: No  Constitutional Fever: No Night sweats?: No Weight loss?: No Fatigue?: No  Skin Skin rash/lesions?: No Itching?: No  Eyes Blurred vision?: No Double vision?: No  Ears/Nose/Throat Sore throat?: No Sinus problems?: No  Hematologic/Lymphatic Swollen glands?: No Easy bruising?: No  Cardiovascular Leg swelling?: No Chest pain?: No  Respiratory Cough?: No Shortness of breath?: No  Endocrine Excessive thirst?: No  Musculoskeletal Back pain?: Yes Joint pain?: Yes  Neurological Headaches?: No Dizziness?: No  Psychologic Depression?: No Anxiety?: No  Physical Exam: BP 122/71   Pulse 72   Temp (!) 97.5 F (36.4 C) (Oral)   Ht 6' (1.829 m)   Wt (!) 306 lb 11.2 oz (139.1 kg)   BMI 41.60 kg/m    Constitutional: Well nourished. Alert and oriented, No acute distress. HEENT: [place] AT, moist mucus membranes. Trachea midline, no masses. Cardiovascular: No clubbing, cyanosis, or edema. Respiratory: Normal respiratory effort, no increased work of breathing. Skin: No rashes, bruises or suspicious lesions. Lymph: No cervical or inguinal adenopathy. Neurologic: Grossly intact, no focal deficits, moving all 4 extremities. Psychiatric: Normal mood and affect.  Laboratory Data: Lab Results  Component Value Date   WBC 9.7 07/11/2015   HGB 15.1 01/04/2017   HCT 46.9 01/04/2017   MCV 82.5 07/11/2015   PLT 198 07/11/2015   PSA History  0.4 ng/mL on 12/26/2015  0.4 ng/mL on 06/11/2016  0.4 ng/mL on 01/04/2017  Lab Results  Component Value Date   TESTOSTERONE 152 (L) 01/04/2017   I have reviewed the labs  Procedure Patient is placed on the exam table in the left lateral jackknife position.  Identified upper outer quadrant of hip for insertion; prepped area with Betadine and injected 10 cc's of Lidocaine 1% with Epinephrine to anesthetize superficially and distally along trocar tract.  Made 3 mm incision using blade of scalpel; trocar with sharp ended stylet was inserted into subcutaneous tissue in line with femur. Sharp stylet was withdrawn and 6 pellets were placed into trocar well. Testopel pellets advanced into tissue using blunt ended stylet. Trocar removed and incision closed using 6 Steri-Strips. Cleansed area to remove the Betadine and covered Steri-Strips with outer Band-Aid.  Careful inspection of insertion is done and patient informed of post procedure instructions.  Advised patient to apply ice to the site for 20-30 minutes every hour if needed.  Avoid hot tubes, swimming or full water immersion of the insertion site for 72 hours.  Bandage may be removed after one week.    Patient is advised  to contact the office if experiencing drainage of the insertion site, excessive  redness or swelling of the site, chills and/or fevers > 101.5, nausea or vomiting, dizziness or lightheadedness and excessive tenderness.  Avoid strenuous activity and heavy lifting for 72 hours.    Assessment & Plan:    1. Testosterone deficiency   -most recent testosterone level is 152 ng/dL on 01/04/2017 (goal 450-600 ng/dL)  -continueTestopel insertion - s/p insertion 6 pellets  - RTC in 3 months for testosterone level, HBG and HCT  -RTC in 6 months for HCT/HBG, testosterone, ADAM and exam    Return in about 3 months (around 04/19/2017) for HBG, HCT and testosterone level.  These notes generated with voice recognition software. I apologize for typographical errors.  Zara Council, [facility] [address] [place], [place] [address] [phone]

## 2017-01-18 ENCOUNTER — Ambulatory Visit (INDEPENDENT_AMBULATORY_CARE_PROVIDER_SITE_OTHER): Payer: Medicare Other | Admitting: Urology

## 2017-01-18 ENCOUNTER — Encounter: Payer: Self-pay | Admitting: Urology

## 2017-01-18 VITALS — BP 122/71 | HR 72 | Temp 97.5°F | Ht 72.0 in | Wt 306.7 lb

## 2017-01-18 DIAGNOSIS — E349 Endocrine disorder, unspecified: Secondary | ICD-10-CM | POA: Insufficient documentation

## 2017-01-18 MED ORDER — TESTOSTERONE 75 MG IL PLLT
75.0000 mg | PELLET | Freq: Once | Status: AC
  Administered 2017-01-18: 75 mg

## 2017-02-27 IMAGING — CR DG FINGER LITTLE 2+V*L*
3 series · 3 of 3 positions shown · non-contrast
Comparison: None.

CLINICAL DATA: 64-year-old male status post injury from fishhook in
left fifth finger. Query retained foreign body. Initial encounter.

EXAM:
LEFT LITTLE FINGER 2+V

[finger ap]
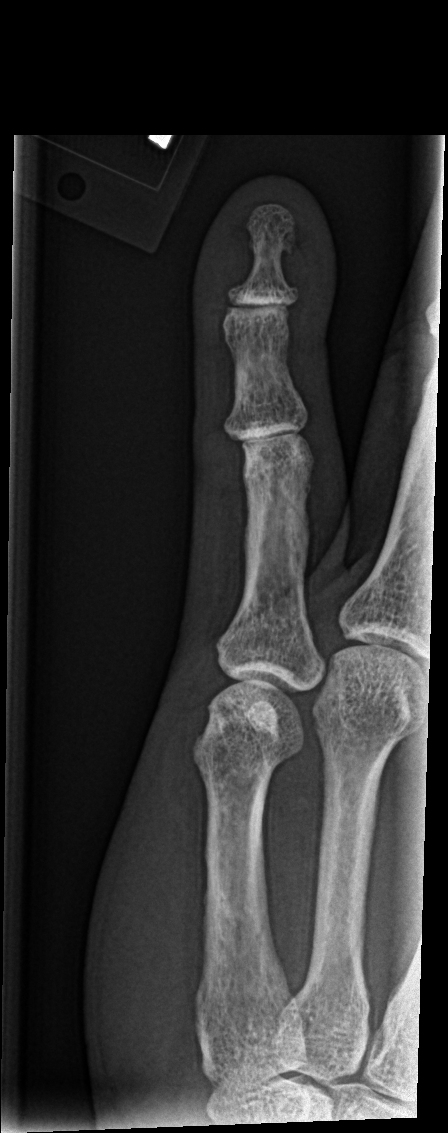

[finger obl]
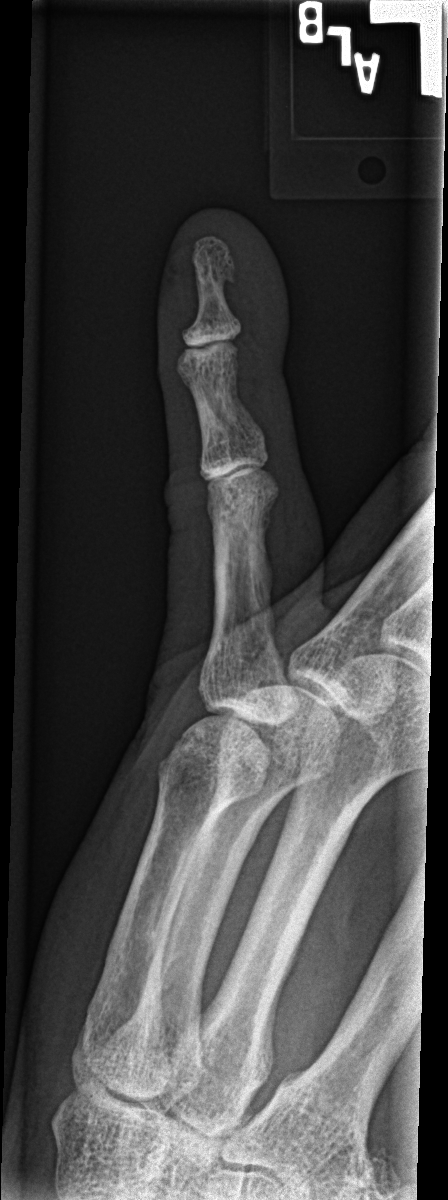

[finger lat]
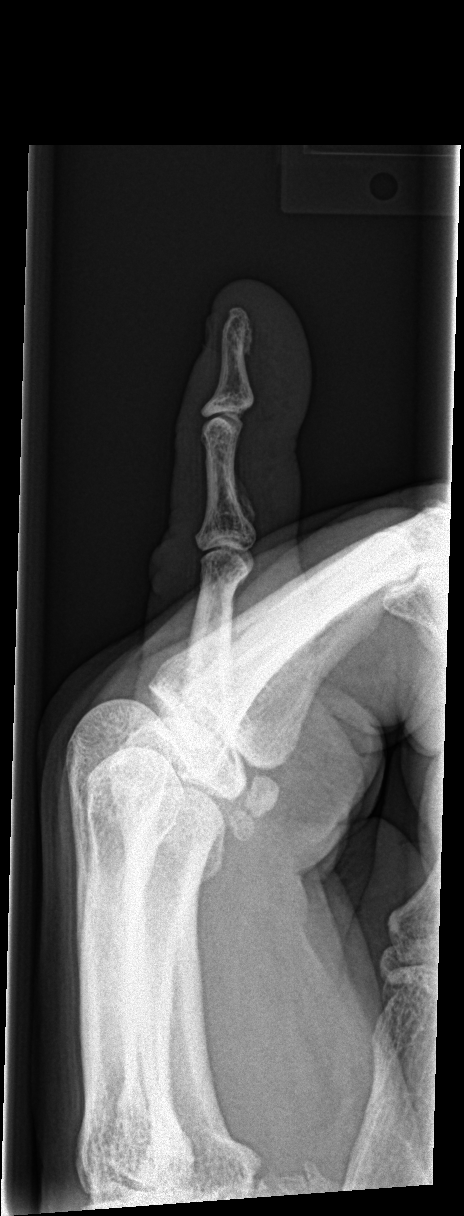

[3 of 3 positions shown; findings below may reference images not displayed]

FINDINGS: Three views of the left fifth finger. Diffuse soft tissue swelling.
Suggestion of trace subcutaneous gas on image 2 (arrow). No
radiopaque foreign body identified. No acute osseous abnormality
identified.
IMPRESSION: Soft-tissue swelling with trace subcutaneous gas, but no radiopaque
foreign body identified.

## 2017-04-11 DIAGNOSIS — M1711 Unilateral primary osteoarthritis, right knee: Secondary | ICD-10-CM | POA: Insufficient documentation

## 2017-04-20 ENCOUNTER — Other Ambulatory Visit: Payer: Self-pay

## 2017-04-20 DIAGNOSIS — E349 Endocrine disorder, unspecified: Secondary | ICD-10-CM

## 2017-04-21 ENCOUNTER — Other Ambulatory Visit: Payer: Medicare Other

## 2017-04-21 DIAGNOSIS — E349 Endocrine disorder, unspecified: Secondary | ICD-10-CM

## 2017-04-22 DIAGNOSIS — Z79899 Other long term (current) drug therapy: Secondary | ICD-10-CM | POA: Insufficient documentation

## 2017-04-22 LAB — TESTOSTERONE: Testosterone: 305 ng/dL (ref 264–916)

## 2017-04-22 LAB — HEMATOCRIT: Hematocrit: 49.8 % (ref 37.5–51.0)

## 2017-04-22 LAB — HEMOGLOBIN: HEMOGLOBIN: 15.5 g/dL (ref 13.0–17.7)

## 2017-04-25 ENCOUNTER — Telehealth: Payer: Self-pay

## 2017-04-25 NOTE — Telephone Encounter (Signed)
-----   Message from Nori Riis, PA-C sent at 04/22/2017  7:44 AM EST ----- Patient's labs are normal.  He needs to be scheduled for a Testopel insertion.

## 2017-04-25 NOTE — Telephone Encounter (Signed)
LMOM- labs normal and can schedule testopel.

## 2017-04-27 NOTE — Telephone Encounter (Signed)
Will send a letter

## 2017-05-17 NOTE — Progress Notes (Signed)
    05/18/2017 12:28 PM   Lance [name] [birth year] [id number]  Referring provider: Ezequiel Kayser, MD [place] [facility] [place], [place] [address]  Chief Complaint  Patient presents with  . Other    testopel    HPI: Patient is a 67 year old diabetic Caucasian male with a history of testosterone deficiency, BPH with L UTS and erectile dysfunction who presents today for a Testopel insertion     Testosterone deficiency Patient is experiencing a decrease in libido, a lack of energy, a decrease in strength, a loss in height, a decreased enjoyment in life,  erections being less strong and a recent deterioration in their work performance.  This is indicated by his responses to the ADAM questionnaire.  He is no longer having spontaneous erections at night.   He has sleep apnea and is sleeping with a CPAP machine.  His current testosterone level is 305 ng/dL on 04/21/2017.   His HBG and HCT are normal.  He is currently managing his hypogonadism with Testopel q 3 months.     Procedure Patient is placed on the exam table in the right lateral jackknife position.  Identified upper outer quadrant of hip for insertion; prepped area with Betadine and injected 10 cc's of Lidocaine 1% with Epinephrine to anesthetize superficially and distally along trocar tract.  Made 3 mm incision using blade of scalpel; trocar with sharp ended stylet was inserted into subcutaneous tissue in line with femur. Sharp stylet was withdrawn and 6 pellets were placed into trocar well. Testopel pellets advanced into tissue using blunt ended stylet. Trocar removed and incision closed using 6 Steri-Strips. Cleansed area to remove the Betadine and covered Steri-Strips with outer Band-Aid.  Careful inspection of insertion is done and patient informed of post procedure instructions.  Advised patient to apply ice to the site for 20-30 minutes every hour if needed.  Avoid hot tubes, swimming or full water  immersion of the insertion site for 72 hours.  Bandage may be removed after one week.    Patient is advised to contact the office if experiencing drainage of the insertion site, excessive redness or swelling of the site, chills and/or fevers > 101.5, nausea or vomiting, dizziness or lightheadedness and excessive tenderness.  Avoid strenuous activity and heavy lifting for 72 hours.    Assessment & Plan:    1. Testosterone deficiency   -most recent testosterone level is 305 ng/dL on 04/2017 (goal 450-600 ng/dL)  -continueTestopel insertion - s/p insertion 6 pellets  -RTC in 3 months for testosterone level, HBG and HCT and ADAM    2. BPH with LU TS  - RTC in 3 months for I PSS, PSA and exam  3. ED  - RTC in 3 months for SHIM and exam  Return in about 3 months (around 08/16/2017) for ADAM, IPSS, SHIM and exam, PSA, HCT, HBG, testosterone (before 10 AM).  These notes generated with voice recognition software. I apologize for typographical errors.  Zara Council, [facility] [address] [place], [place] [address] [phone]

## 2017-05-18 ENCOUNTER — Encounter: Payer: Self-pay | Admitting: Urology

## 2017-05-18 ENCOUNTER — Ambulatory Visit (INDEPENDENT_AMBULATORY_CARE_PROVIDER_SITE_OTHER): Payer: Medicare Other | Admitting: Urology

## 2017-05-18 DIAGNOSIS — E349 Endocrine disorder, unspecified: Secondary | ICD-10-CM | POA: Diagnosis not present

## 2017-07-19 ENCOUNTER — Ambulatory Visit (INDEPENDENT_AMBULATORY_CARE_PROVIDER_SITE_OTHER): Payer: Medicare Other | Admitting: Urology

## 2017-07-19 ENCOUNTER — Encounter: Payer: Self-pay | Admitting: Urology

## 2017-07-19 VITALS — BP 113/66 | HR 75 | Resp 16 | Ht 74.0 in | Wt 300.0 lb

## 2017-07-19 DIAGNOSIS — N400 Enlarged prostate without lower urinary tract symptoms: Secondary | ICD-10-CM

## 2017-07-19 DIAGNOSIS — N138 Other obstructive and reflux uropathy: Secondary | ICD-10-CM | POA: Diagnosis not present

## 2017-07-19 DIAGNOSIS — N529 Male erectile dysfunction, unspecified: Secondary | ICD-10-CM

## 2017-07-19 DIAGNOSIS — N401 Enlarged prostate with lower urinary tract symptoms: Secondary | ICD-10-CM

## 2017-07-19 DIAGNOSIS — E349 Endocrine disorder, unspecified: Secondary | ICD-10-CM | POA: Diagnosis not present

## 2017-07-19 MED ORDER — CYPROHEPTADINE HCL 4 MG PO TABS: ORAL_TABLET | ORAL | 0 refills | Status: DC

## 2017-07-19 MED ORDER — SILDENAFIL CITRATE 20 MG PO TABS: ORAL_TABLET | ORAL | 3 refills | Status: DC

## 2017-07-19 MED ORDER — TAMSULOSIN HCL 0.4 MG PO CAPS: 0.4000 mg | ORAL_CAPSULE | Freq: Every day | ORAL | 3 refills | Status: DC

## 2017-07-19 NOTE — Progress Notes (Signed)
07/19/2017 10:03 PM   Lance [name] [birth year] [id number]  Referring provider: Ezequiel Kayser, MD [place] [facility] [place], [place] [address]  No chief complaint on file.   HPI: Patient is a 67 year old diabetic Caucasian male with a history of testosterone deficiency, BPH with L UTS and erectile dysfunction who presents today for a 3 months follow up.     Testosterone deficiency Patient is experiencing a decrease in libido, a lack of energy, a decrease in strength, a loss in height, a decreased enjoyment in life,  erections being less strong and a recent deterioration in their work performance.  This is indicated by his responses to the ADAM questionnaire.  He is no longer having spontaneous erections at night.   He has sleep apnea and is sleeping with a CPAP machine.  His current testosterone level is 389 ng/dL on 07/19/2017.   His HBG and HCT are normal.  He is currently managing his hypogonadism with Testopel q 3 months.       BPH WITH LUTS  (prostate and/or bladder) His IPSS score today is 15, which is moderate lower urinary tract symptomatology. He is mostly satisfied with his quality life due to his urinary symptoms.  His previous IPSS score was 13/2.   His major complaints today are frequency and intermittency .  He has had these symptoms for several years.  He denies any dysuria, hematuria or suprapubic pain.   He currently taking tamsulosin 0.4 mg daily.    His has had HoLEP on 08/28/2014.      He also denies any recent fevers, chills, nausea or vomiting.  He does not have a family history of PCa.  IPSS    Row Name 07/19/17 0900         International Prostate Symptom Score   How often have you had the sensation of not emptying your bladder?  Less than 1 in 5     How often have you had to urinate less than every two hours?  About half the time     How often have you found you stopped and started again several times when you urinated?   Almost always     How often have you found it difficult to postpone urination?  Less than 1 in 5 times     How often have you had a weak urinary stream?  Less than 1 in 5 times     How often have you had to strain to start urination?  Less than half the time     How many times did you typically get up at night to urinate?  2 Times     Total IPSS Score  15       Quality of Life due to urinary symptoms   If you were to spend the rest of your life with your urinary condition just the way it is now how would you feel about that?  Mostly Satisfied        Score:  1-7 Mild 8-19 Moderate 20-35 Severe    Erectile dysfunction His SHIM score is 13, which is mild to moderate ED.   His previous SHIM score was 13.  He has been having difficulty with erections for several years. His major complaint is no erections without PDE-5 inhibitors.  His libido is diminished.   His risk factors for ED are [name], BPH, testosterone deficiency, DM, HTN, HLD, spinal injury, COPD, anticoagulation therapy and sleep apnea.  He  denies any painful erections or curvatures with his erections.   He is still having/no longer having spontaneous erections.  He has tried sildenafil in the past and it is effective.     SHIM    Row Name 07/19/17 0939         SHIM: Over the last 6 months:   How do you rate your confidence that you could get and keep an erection?  Very Low     When you had erections with sexual stimulation, how often were your erections hard enough for penetration (entering your partner)?  A Few Times (much less than half the time)     During sexual intercourse, how often were you able to maintain your erection after you had penetrated (entered) your partner?  Most Times (much more than half the time)     During sexual intercourse, how difficult was it to maintain your erection to completion of intercourse?  Slightly Difficult     When you attempted sexual intercourse, how often was it satisfactory for you?  A  Few Times (much less than half the time)       SHIM Total Score   SHIM  13        Score: 1-7 Severe ED 8-11 Moderate ED 12-16 Mild-Moderate ED 17-21 Mild ED 22-25 No ED     PMH: Past Medical History:  Diagnosis Date  . Acute cystitis   . Allergic rhinitis   . BPH (benign prostatic hyperplasia)   . Carpal tunnel syndrome   . CHF (congestive heart failure) ([place])   . Complication of anesthesia    long time to wake  . DDD (degenerative disc disease), lumbar   . Degenerative lumbar spinal stenosis   . Diabetes mellitus ([place])   . Erectile dysfunction   . GERD (gastroesophageal reflux disease)   . Gout   . History of DVT (deep vein thrombosis)   . History of migraine headaches   . History of pulmonary embolism   . Hypercholesterolemia   . Hypercoagulable state ([place])   . Hypertension   . Hypogonadism in male   . Infection of skin   . Iron deficiency anemia   . Lower urinary tract symptoms   . Meralgia paresthetica   . Obesity   . Post-void dribbling   . Prostatitis   . Sleep apnea   . SNHL (sensorineural hearing loss)     Surgical History: Past Surgical History:  Procedure Laterality Date  . Blood vessel Surgery    . CARPAL TUNNEL RELEASE Bilateral 2011  . COLONOSCOPY WITH PROPOFOL N/A 07/11/2015   Procedure: COLONOSCOPY WITH PROPOFOL;  Surgeon: Lance Sails, MD;  [facility];  Service: Endoscopy;  Laterality: N/A;  . FOOT SURGERY Bilateral   . FOOT SURGERY Left 7062   complicated by DVT  . [place]  . [place]   x2  . HOLEP-LASER ENUCLEATION OF THE PROSTATE WITH MORCELLATION  08/28/2014   Dr. Hollice [name]  . HYDROCELE EXCISION / REPAIR  1985   x2  . Hydrocelectomy     x 2  . LEG SURGERY Right 1974   fatty tumor  . REPLACEMENT TOTAL KNEE Left 2012  . SHOULDER SURGERY Right 1968  . TONSILLECTOMY  1958  . TOTAL SHOULDER ARTHROPLASTY Right 09/18/2014   arthroplasty, glenohumeral joint; total shoulder (glenoid  and prosimal humeral replacement)  . VASECTOMY  1979    Home Medications:  Allergies as of 07/19/2017  Reactions   Penicillins    Other reaction(s): UNKNOWN      Medication List        Accurate as of 07/19/17 11:59 PM. Always use your most recent med list.          cyclobenzaprine 10 MG tablet Commonly known as:  FLEXERIL Take by mouth.   cyproheptadine 4 MG tablet Commonly known as:  PERIACTIN Take one tablet three hour prior to intercourse   fluticasone 50 MCG/ACT nasal spray Commonly known as:  FLONASE Reported on 07/29/2015   HYDROcodone-acetaminophen 5-325 MG tablet Commonly known as:  NORCO/VICODIN 5-325 tablets. 1/2 to 1 tablet 3 times a day   lisinopril-hydrochlorothiazide 10-12.5 MG tablet Commonly known as:  PRINZIDE,ZESTORETIC Take 1 tablet by mouth daily.   lisinopril-hydrochlorothiazide 20-12.5 MG tablet Commonly known as:  PRINZIDE,ZESTORETIC Take by mouth.   MULTI-VITAMINS Tabs Take by mouth.   MULTIVITAMIN ADULT PO Take by mouth.   NEURONTIN 300 MG capsule Generic drug:  gabapentin Take 300 mg by mouth.   pantoprazole 40 MG tablet Commonly known as:  PROTONIX   PROAIR HFA 108 (90 Base) MCG/ACT inhaler Generic drug:  albuterol Inhale into the lungs. Reported on 07/29/2015   sildenafil 20 MG tablet Commonly known as:  REVATIO Take 3 to 5 tablets two hours before intercouse on an empty stomach.  Do not take with nitrates.   tamsulosin 0.4 MG Caps capsule Commonly known as:  FLOMAX Take 1 capsule (0.4 mg total) by mouth daily.   Testosterone 75 MG Pllt Inject into the skin.   VICTOZA 18 MG/3ML Sopn Generic drug:  liraglutide Inject into the skin.   warfarin 5 MG tablet Commonly known as:  COUMADIN Take by mouth.       Allergies:  Allergies  Allergen Reactions  . Penicillins     Other reaction(s): UNKNOWN    Family History: Family History  Problem Relation [name] of Onset  . Thrombosis Unknown   . Skin cancer Father    . Kidney disease Neg Hx   . Prostate cancer Neg Hx   . Kidney cancer Neg Hx   . Bladder Cancer Neg Hx     Social History:  reports that he has never smoked. He has never used smokeless tobacco. He reports that he does not drink alcohol or use drugs.  ROS: UROLOGY Frequent Urination?: Yes Hard to postpone urination?: No Burning/pain with urination?: No Get up at night to urinate?: No Leakage of urine?: No Urine stream starts and stops?: Yes Trouble starting stream?: No Do you have to strain to urinate?: No Blood in urine?: No Urinary tract infection?: No Sexually transmitted disease?: No Injury to kidneys or bladder?: No Painful intercourse?: No Weak stream?: No Erection problems?: Yes Penile pain?: No  Gastrointestinal Nausea?: No Vomiting?: No Indigestion/heartburn?: No Diarrhea?: No Constipation?: Yes  Constitutional Fever: No Night sweats?: No Weight loss?: No Fatigue?: No  Skin Skin rash/lesions?: No Itching?: No  Eyes Blurred vision?: Yes Double vision?: No  Ears/Nose/Throat Sore throat?: No Sinus problems?: No  Hematologic/Lymphatic Swollen glands?: No Easy bruising?: No  Cardiovascular Leg swelling?: Yes Chest pain?: No  Respiratory Cough?: No Shortness of breath?: No  Endocrine Excessive thirst?: No  Musculoskeletal Back pain?: Yes Joint pain?: Yes  Neurological Headaches?: No Dizziness?: No  Psychologic Depression?: No Anxiety?: No  Physical Exam: BP 113/66   Pulse 75   Resp 16   Ht 6\' 2"  (1.88 m)   Wt 300 lb (136.1 kg)   SpO2 98%  BMI 38.52 kg/m   Constitutional: Well nourished. Alert and oriented, No acute distress. HEENT: [place] AT, moist mucus membranes. Trachea midline, no masses. Cardiovascular: No clubbing, cyanosis, or edema. Respiratory: Normal respiratory effort, no increased work of breathing. GI: Abdomen is soft, non tender, non distended, no abdominal masses. Liver and spleen not palpable.  No hernias  appreciated.  Stool sample for occult testing is not indicated.   GU: No CVA tenderness.  No bladder fullness or masses.  Patient with circumcised phallus.  Urethral meatus is patent.  No penile discharge. No penile lesions or rashes. Scrotum without lesions, cysts, rashes and/or edema.  Testicles are located scrotally bilaterally. No masses are appreciated in the testicles. Left and right epididymis are normal. Rectal: Patient with  normal sphincter tone. Anus and perineum without scarring or rashes. No rectal masses are appreciated. Prostate is approximately 45 grams, no nodules are appreciated. Seminal vesicles are normal. Skin: No rashes, bruises or suspicious lesions. Lymph: No cervical or inguinal adenopathy. Neurologic: Grossly intact, no focal deficits, moving all 4 extremities. Psychiatric: Normal mood and affect.   Laboratory Data: Lab Results  Component Value Date   WBC 9.7 07/11/2015   HGB 14.7 07/19/2017   HCT 45.9 07/19/2017   MCV 82.5 07/11/2015   PLT 198 07/11/2015   PSA History  0.4 ng/mL on 12/26/2015  0.4 ng/mL on 06/11/2016  0.4 ng/mL on 01/04/2017    Lab Results  Component Value Date   TESTOSTERONE 389 07/19/2017   I have reviewed the labs   Assessment & Plan:    1. Testosterone deficiency   -most recent testosterone level is 389 ng/dL on 07/19/2017 (goal 450-600 ng/dL)  -continue Testopel insertion - RTC for insertion   2. BPH with LUTS  - IPSS score is 15/2, it is slightly worse  - Continue conservative management, avoiding bladder irritants and timed voiding's  - Continue tamsulosin 0.4 mg daily; refills given  - RTC in 6 months for IPSS, PSA, PVR and exam, as testosterone therapy can cause prostate enlargement and worsen LUTS  3. Erectile dysfunction:     -SHIM score is 13  -continue sildenafil 20 mg, 3-5 tablets 2 hours prior to intercourse on an empty stomach: refills given  -RTC in 6 months for SHIM score and exam, as testosterone therapy can  affect erections   Return for schedule Testopel.  These notes generated with voice recognition software. I apologize for typographical errors.  Zara Council, [facility] [address], [place], [place] [address] [phone]

## 2017-07-20 LAB — TESTOSTERONE: Testosterone: 389 ng/dL (ref 264–916)

## 2017-07-20 LAB — HEMATOCRIT: Hematocrit: 45.9 % (ref 37.5–51.0)

## 2017-07-20 LAB — HEMOGLOBIN: HEMOGLOBIN: 14.7 g/dL (ref 13.0–17.7)

## 2017-07-27 ENCOUNTER — Ambulatory Visit: Payer: Medicare Other | Admitting: Urology

## 2017-08-07 ENCOUNTER — Telehealth: Payer: Self-pay | Admitting: Urology

## 2017-08-07 NOTE — Telephone Encounter (Signed)
Patient's PSA was not completed at his visit on 07/19/2017.  He will to need to have his PSA drawn prior to his Testopel on the 11th.

## 2017-08-08 ENCOUNTER — Other Ambulatory Visit: Payer: Medicare Other

## 2017-08-08 DIAGNOSIS — N400 Enlarged prostate without lower urinary tract symptoms: Secondary | ICD-10-CM

## 2017-08-08 NOTE — Telephone Encounter (Signed)
Spoke w/ pt, he would like to come in today for psa.

## 2017-08-09 ENCOUNTER — Telehealth: Payer: Self-pay | Admitting: Urology

## 2017-08-09 LAB — PSA: PROSTATE SPECIFIC AG, SERUM: 0.5 ng/mL (ref 0.0–4.0)

## 2017-08-09 NOTE — Telephone Encounter (Signed)
-----   Message from Nori Riis, PA-C sent at 08/09/2017  7:34 AM EDT ----- Patient's PSA is stable at 0.5.   We will see him for his Testopel.

## 2017-08-09 NOTE — Telephone Encounter (Signed)
App has been made ° ° °Michelle °

## 2017-08-17 NOTE — Progress Notes (Signed)
This is a 67 -year-old male with hypogonadism and he is managed with Testopel. He presents today for Testopel insertion.  Patient is placed on the exam table in the left lateral jackknife position.  Identified upper outer quadrant of hip for insertion; prepped area with Betadine and injected 10 cc's of Lidocaine 1% with Epinephrine to anesthetize superficially and distally along trocar tract.  Made 3 mm incision using 15 blade of scalpel; trocar with sharp ended stylet was inserted into subcutaneous tissue in line with femur. Sharp stylet was withdrawn and 6 pellets were placed into trocar well. Testopel pellets advanced into tissue using blunt ended stylet. Trocar removed and incision closed using 9 Steri-Strips. Cleansed area to remove Betadine and covered with outer Band-Aid.  Careful inspection of insertion is done and patient informed of post procedure instructions.  Advised patient to apply ice to the site for 20-30 minutes every hour if needed.  Avoid hot tubes, swimming or full water immersion of the insertion site for 72 hours.  Bandage may be removed after one week.    Patient is advised to contact the office if experiencing drainage of the insertion site, excessive redness or swelling of the site, chills and/or fevers > 101.5, nausea or vomiting, dizziness or lightheadedness and excessive tenderness.  Avoid strenuous activity and heavy lifting for 72 hours.     He will return in one month for serum testosterone before 9:00am.

## 2017-08-18 ENCOUNTER — Encounter: Payer: Self-pay | Admitting: Urology

## 2017-08-18 ENCOUNTER — Ambulatory Visit (INDEPENDENT_AMBULATORY_CARE_PROVIDER_SITE_OTHER): Payer: Medicare Other | Admitting: Urology

## 2017-08-18 VITALS — BP 129/71 | HR 68 | Ht 73.0 in | Wt 300.0 lb

## 2017-08-18 DIAGNOSIS — E349 Endocrine disorder, unspecified: Secondary | ICD-10-CM | POA: Diagnosis not present

## 2017-08-18 MED ORDER — TESTOSTERONE 75 MG IL PLLT
75.0000 mg | PELLET | Freq: Once | Status: AC
  Administered 2017-08-18: 75 mg

## 2017-09-21 ENCOUNTER — Other Ambulatory Visit: Payer: Self-pay | Admitting: Family Medicine

## 2017-09-21 DIAGNOSIS — E349 Endocrine disorder, unspecified: Secondary | ICD-10-CM

## 2017-09-22 ENCOUNTER — Other Ambulatory Visit: Payer: Medicare Other

## 2017-09-22 DIAGNOSIS — E349 Endocrine disorder, unspecified: Secondary | ICD-10-CM

## 2017-09-23 LAB — TESTOSTERONE: Testosterone: 496 ng/dL (ref 264–916)

## 2017-10-04 ENCOUNTER — Telehealth: Payer: Self-pay

## 2017-10-04 DIAGNOSIS — E291 Testicular hypofunction: Secondary | ICD-10-CM

## 2017-10-04 DIAGNOSIS — E349 Endocrine disorder, unspecified: Secondary | ICD-10-CM

## 2017-10-04 NOTE — Telephone Encounter (Signed)
Pt informed, please call and schedule labs.

## 2017-10-04 NOTE — Telephone Encounter (Signed)
-----   Message from Nori Riis, PA-C sent at 10/04/2017 11:15 AM EDT ----- Please let Lance [name] know that his testosterone is in the therapeutic range.  We will need to see him in three months for his next Testopel.  He will need a testosterone, Hbg and HCT prior to his appointment.

## 2017-10-13 ENCOUNTER — Other Ambulatory Visit: Payer: Self-pay

## 2017-10-14 NOTE — Discharge Instructions (Signed)

## 2017-10-17 ENCOUNTER — Ambulatory Visit: Payer: Medicare Other | Admitting: Anesthesiology

## 2017-10-17 ENCOUNTER — Encounter
Admission: RE | Disposition: A | Discharge status: Home / Self Care | Payer: Self-pay | Source: Ambulatory Visit | Attending: Ophthalmology

## 2017-10-17 ENCOUNTER — Ambulatory Visit
Admission: RE | Admit: 2017-10-17 | Discharge: 2017-10-17 | Disposition: A | Discharge status: Home / Self Care | Payer: Medicare Other | Source: Ambulatory Visit | Attending: Ophthalmology | Admitting: Ophthalmology

## 2017-10-17 DIAGNOSIS — Z880 Allergy status to penicillin: Secondary | ICD-10-CM | POA: Insufficient documentation

## 2017-10-17 DIAGNOSIS — E1136 Type 2 diabetes mellitus with diabetic cataract: Secondary | ICD-10-CM | POA: Diagnosis present

## 2017-10-17 DIAGNOSIS — I10 Essential (primary) hypertension: Secondary | ICD-10-CM | POA: Diagnosis not present

## 2017-10-17 DIAGNOSIS — Z96611 Presence of right artificial shoulder joint: Secondary | ICD-10-CM | POA: Insufficient documentation

## 2017-10-17 DIAGNOSIS — Z96652 Presence of left artificial knee joint: Secondary | ICD-10-CM | POA: Diagnosis not present

## 2017-10-17 DIAGNOSIS — H2511 Age-related nuclear cataract, right eye: Secondary | ICD-10-CM | POA: Insufficient documentation

## 2017-10-17 DIAGNOSIS — G4730 Sleep apnea, unspecified: Secondary | ICD-10-CM | POA: Diagnosis not present

## 2017-10-17 DIAGNOSIS — Z9079 Acquired absence of other genital organ(s): Secondary | ICD-10-CM | POA: Insufficient documentation

## 2017-10-17 DIAGNOSIS — Z86718 Personal history of other venous thrombosis and embolism: Secondary | ICD-10-CM | POA: Insufficient documentation

## 2017-10-17 HISTORY — DX: Headache, unspecified: R51.9

## 2017-10-17 HISTORY — DX: Bronchitis, not specified as acute or chronic: J40

## 2017-10-17 HISTORY — DX: Unspecified hearing loss, unspecified ear: H91.90

## 2017-10-17 HISTORY — DX: Ankylosing hyperostosis (forestier), site unspecified: M48.10

## 2017-10-17 HISTORY — DX: Dizziness and giddiness: R42

## 2017-10-17 HISTORY — PX: CATARACT EXTRACTION W/PHACO: SHX586

## 2017-10-17 HISTORY — DX: Headache: R51

## 2017-10-17 HISTORY — DX: Cervicalgia: M54.2

## 2017-10-17 HISTORY — DX: Dyspnea, unspecified: R06.00

## 2017-10-17 LAB — GLUCOSE, CAPILLARY
GLUCOSE-CAPILLARY: 110 mg/dL — AB (ref 65–99)
GLUCOSE-CAPILLARY: 119 mg/dL — AB (ref 65–99)

## 2017-10-17 SURGERY — PHACOEMULSIFICATION, CATARACT, WITH IOL INSERTION
Anesthesia: Monitor Anesthesia Care | Site: Eye | Laterality: Right | Wound class: "Clean "

## 2017-10-17 MED ORDER — BRIMONIDINE TARTRATE-TIMOLOL 0.2-0.5 % OP SOLN
OPHTHALMIC | Status: DC | PRN
  Administered 2017-10-17: 1 [drp] via OPHTHALMIC

## 2017-10-17 MED ORDER — ARMC OPHTHALMIC DILATING DROPS
1.0000 "application " | OPHTHALMIC | Status: DC | PRN
  Administered 2017-10-17 (×3): 1 via OPHTHALMIC

## 2017-10-17 MED ORDER — LIDOCAINE HCL (PF) 2 % IJ SOLN
INTRAOCULAR | Status: DC | PRN
  Administered 2017-10-17: 1 mL via INTRAMUSCULAR

## 2017-10-17 MED ORDER — LACTATED RINGERS IV SOLN: INTRAVENOUS | Status: DC

## 2017-10-17 MED ORDER — ACETAMINOPHEN 160 MG/5ML PO SOLN: 325.0000 mg | Freq: Once | ORAL | Status: DC

## 2017-10-17 MED ORDER — MIDAZOLAM HCL 2 MG/2ML IJ SOLN
INTRAMUSCULAR | Status: DC | PRN
  Administered 2017-10-17 (×2): 1 mg via INTRAVENOUS

## 2017-10-17 MED ORDER — MOXIFLOXACIN HCL 0.5 % OP SOLN
1.0000 [drp] | OPHTHALMIC | Status: DC | PRN
  Administered 2017-10-17 (×3): 1 [drp] via OPHTHALMIC

## 2017-10-17 MED ORDER — LACTATED RINGERS IV SOLN: 10.0000 mL/h | INTRAVENOUS | Status: DC

## 2017-10-17 MED ORDER — NA HYALUR & NA CHOND-NA HYALUR 0.4-0.35 ML IO KIT
PACK | INTRAOCULAR | Status: DC | PRN
  Administered 2017-10-17: 1 mL via INTRAOCULAR

## 2017-10-17 MED ORDER — MOXIFLOXACIN HCL 0.5 % OP SOLN
OPHTHALMIC | Status: DC | PRN
  Administered 2017-10-17: 0.2 mL via OPHTHALMIC

## 2017-10-17 MED ORDER — ACETAMINOPHEN 325 MG PO TABS: 325.0000 mg | ORAL_TABLET | Freq: Once | ORAL | Status: DC

## 2017-10-17 MED ORDER — EPINEPHRINE PF 1 MG/ML IJ SOLN
INTRAOCULAR | Status: DC | PRN
  Administered 2017-10-17: 87 mL via OPHTHALMIC

## 2017-10-17 MED ORDER — FENTANYL CITRATE (PF) 100 MCG/2ML IJ SOLN
INTRAMUSCULAR | Status: DC | PRN
  Administered 2017-10-17: 50 ug via INTRAVENOUS

## 2017-10-17 SURGICAL SUPPLY — 27 items
CANNULA ANT/CHMB 27G (MISCELLANEOUS) ×1 IMPLANT
CANNULA ANT/CHMB 27GA (MISCELLANEOUS) ×3 IMPLANT
CARTRIDGE ABBOTT (MISCELLANEOUS) IMPLANT
GLOVE SURG LX 7.5 STRW (GLOVE) ×2
GLOVE SURG LX STRL 7.5 STRW (GLOVE) ×1 IMPLANT
GLOVE SURG TRIUMPH 8.0 PF LTX (GLOVE) ×3 IMPLANT
GOWN STRL REUS W/ TWL LRG LVL3 (GOWN DISPOSABLE) ×2 IMPLANT
GOWN STRL REUS W/TWL LRG LVL3 (GOWN DISPOSABLE) ×4
LENS IOL TECNIS ITEC 20.5 (Intraocular Lens) ×2 IMPLANT
MARKER SKIN DUAL TIP RULER LAB (MISCELLANEOUS) ×3 IMPLANT
NDL FILTER BLUNT 18X1 1/2 (NEEDLE) ×1 IMPLANT
NDL RETROBULBAR .5 NSTRL (NEEDLE) IMPLANT
NEEDLE FILTER BLUNT 18X 1/2SAF (NEEDLE) ×2
NEEDLE FILTER BLUNT 18X1 1/2 (NEEDLE) ×1 IMPLANT
PACK CATARACT BRASINGTON (MISCELLANEOUS) ×3 IMPLANT
PACK EYE AFTER SURG (MISCELLANEOUS) ×3 IMPLANT
PACK OPTHALMIC (MISCELLANEOUS) ×3 IMPLANT
RING MALYGIN 7.0 (MISCELLANEOUS) IMPLANT
SUT ETHILON 10-0 CS-B-6CS-B-6 (SUTURE)
SUT VICRYL  9 0 (SUTURE)
SUT VICRYL 9 0 (SUTURE) IMPLANT
SUTURE EHLN 10-0 CS-B-6CS-B-6 (SUTURE) IMPLANT
SYR 3ML LL SCALE MARK (SYRINGE) ×3 IMPLANT
SYR 5ML LL (SYRINGE) ×3 IMPLANT
SYR TB 1ML LUER SLIP (SYRINGE) ×3 IMPLANT
WATER STERILE IRR 500ML POUR (IV SOLUTION) ×3 IMPLANT
WIPE NON LINTING 3.25X3.25 (MISCELLANEOUS) ×3 IMPLANT

## 2017-10-17 NOTE — H&P (Signed)
The History and Physical notes are on paper, have been signed, and are to be scanned. The patient remains stable and unchanged from the H&P.   Previous H&P reviewed, patient examined, and there are no changes.  Lance [name] 10/17/2017 7:39 AM

## 2017-10-17 NOTE — Transfer of Care (Signed)
Immediate Anesthesia Transfer of Care Note  Patient: Lance [name]  Procedure(s) Performed: CATARACT EXTRACTION PHACO AND INTRAOCULAR LENS PLACEMENT (IOC)  RIGHT DIABETIC (Right Eye)  Patient Location: PACU  Anesthesia Type: MAC  Level of Consciousness: awake, alert  and patient cooperative  Airway and Oxygen Therapy: Patient Spontanous Breathing and Patient connected to supplemental oxygen  Post-op Assessment: Post-op Vital signs reviewed, Patient's Cardiovascular Status Stable, Respiratory Function Stable, Patent Airway and No signs of Nausea or vomiting  Post-op Vital Signs: Reviewed and stable  Complications: No apparent anesthesia complications

## 2017-10-17 NOTE — Anesthesia Preprocedure Evaluation (Signed)
Anesthesia Evaluation  Patient identified by MRN, date of birth, ID band Patient awake    Reviewed: Allergy & Precautions, H&P , NPO status , Patient's Chart, lab work & pertinent test results  Airway Mallampati: III  TM Distance: >3 FB Neck ROM: full    Dental no notable dental hx.    Pulmonary shortness of breath, sleep apnea and Continuous Positive Airway Pressure Ventilation ,    Pulmonary exam normal breath sounds clear to auscultation       Cardiovascular hypertension, Normal cardiovascular exam Rhythm:regular Rate:Normal     Neuro/Psych    GI/Hepatic GERD  ,  Endo/Other  diabetes  Renal/GU      Musculoskeletal   Abdominal   Peds  Hematology   Anesthesia Other Findings   Reproductive/Obstetrics                             Anesthesia Physical Anesthesia Plan  ASA: III  Anesthesia Plan: MAC   Post-op Pain Management:    Induction:   PONV Risk Score and Plan: 1 and Treatment may vary due to age or medical condition  Airway Management Planned:   Additional Equipment:   Intra-op Plan:   Post-operative Plan:   Informed Consent: I have reviewed the patients History and Physical, chart, labs and discussed the procedure including the risks, benefits and alternatives for the proposed anesthesia with the patient or authorized representative who has indicated his/her understanding and acceptance.     Plan Discussed with: CRNA  Anesthesia Plan Comments:         Anesthesia Quick Evaluation

## 2017-10-17 NOTE — Anesthesia Procedure Notes (Signed)
Procedure Name: MAC Date/Time: 10/17/2017 7:50 AM Performed by: Cameron Ali, CRNA Pre-anesthesia Checklist: Patient identified, Emergency Drugs available, Suction available, Timeout performed and Patient being monitored Patient Re-evaluated:Patient Re-evaluated prior to induction Oxygen Delivery Method: Nasal cannula Placement Confirmation: positive ETCO2

## 2017-10-17 NOTE — Op Note (Signed)
LOCATION:  [place]   PREOPERATIVE DIAGNOSIS:    Nuclear sclerotic cataract right eye. H25.11   POSTOPERATIVE DIAGNOSIS:  Nuclear sclerotic cataract right eye.     PROCEDURE:  Phacoemusification with posterior chamber intraocular lens placement of the right eye   LENS:   Implant Name Type Inv. Item Serial No. Manufacturer Lot No. LRB No. Used  LENS IOL DIOP 20.5 - [id number] Intraocular Lens LENS IOL DIOP 20.5 [id number]  Right 1        ULTRASOUND TIME: 12 % of 1 minutes, 27 seconds.  CDE 10.1   SURGEON:  Wyonia Hough, MD   ANESTHESIA:  Topical with tetracaine drops and 2% Xylocaine jelly, augmented with 1% preservative-free intracameral lidocaine.    COMPLICATIONS:  None.   DESCRIPTION OF PROCEDURE:  The patient was identified in the holding room and transported to the operating room and placed in the supine position under the operating microscope.  The right eye was identified as the operative eye and it was prepped and draped in the usual sterile ophthalmic fashion.   A 1 millimeter clear-corneal paracentesis was made at the 12:00 position.  0.5 ml of preservative-free 1% lidocaine was injected into the anterior chamber. The anterior chamber was filled with Viscoat viscoelastic.  A 2.4 millimeter keratome was used to make a near-clear corneal incision at the 9:00 position.  A curvilinear capsulorrhexis was made with a cystotome and capsulorrhexis forceps.  Balanced salt solution was used to hydrodissect and hydrodelineate the nucleus.   Phacoemulsification was then used in stop and chop fashion to remove the lens nucleus and epinucleus.  The remaining cortex was then removed using the irrigation and aspiration handpiece. Provisc was then placed into the capsular bag to distend it for lens placement.  A lens was then injected into the capsular bag.  The remaining viscoelastic was aspirated.   Wounds were hydrated with balanced salt solution.  The anterior  chamber was inflated to a physiologic pressure with balanced salt solution.  No wound leaks were noted. Vigamox 0.2 ml of a 1mg  per ml solution was injected into the anterior chamber for a dose of 0.2 mg of intracameral antibiotic at the completion of the case.   Timolol and Brimonidine drops were applied to the eye.  The patient was taken tothe recovery room in stable condition without complications of anesthesia or surgery.   Lance [name] 10/17/2017, 8:06 AM

## 2017-10-17 NOTE — Anesthesia Postprocedure Evaluation (Signed)
Anesthesia Post Note  Patient: Lance [name]  Procedure(s) Performed: CATARACT EXTRACTION PHACO AND INTRAOCULAR LENS PLACEMENT (IOC)  RIGHT DIABETIC (Right Eye)  Patient location during evaluation: PACU Anesthesia Type: MAC Level of consciousness: awake and alert and oriented Pain management: satisfactory to patient Vital Signs Assessment: post-procedure vital signs reviewed and stable Respiratory status: spontaneous breathing, nonlabored ventilation and respiratory function stable Cardiovascular status: blood pressure returned to baseline and stable Postop Assessment: Adequate PO intake and No signs of nausea or vomiting Anesthetic complications: no    Raliegh Ip

## 2017-10-18 ENCOUNTER — Encounter: Payer: Self-pay | Admitting: Ophthalmology

## 2017-11-15 NOTE — Discharge Instructions (Signed)

## 2017-11-16 ENCOUNTER — Encounter: Payer: Self-pay | Admitting: *Deleted

## 2017-11-16 ENCOUNTER — Other Ambulatory Visit: Payer: Self-pay

## 2017-11-23 ENCOUNTER — Ambulatory Visit: Payer: Medicare Other | Admitting: Anesthesiology

## 2017-11-23 ENCOUNTER — Ambulatory Visit
Admission: RE | Admit: 2017-11-23 | Discharge: 2017-11-23 | Disposition: A | Discharge status: Home / Self Care | Payer: Medicare Other | Source: Ambulatory Visit | Attending: Ophthalmology | Admitting: Ophthalmology

## 2017-11-23 ENCOUNTER — Encounter
Admission: RE | Disposition: A | Discharge status: Home / Self Care | Payer: Self-pay | Source: Ambulatory Visit | Attending: Ophthalmology

## 2017-11-23 DIAGNOSIS — H2512 Age-related nuclear cataract, left eye: Secondary | ICD-10-CM | POA: Diagnosis present

## 2017-11-23 DIAGNOSIS — E1136 Type 2 diabetes mellitus with diabetic cataract: Secondary | ICD-10-CM | POA: Diagnosis not present

## 2017-11-23 DIAGNOSIS — Z96611 Presence of right artificial shoulder joint: Secondary | ICD-10-CM | POA: Insufficient documentation

## 2017-11-23 DIAGNOSIS — Z96652 Presence of left artificial knee joint: Secondary | ICD-10-CM | POA: Insufficient documentation

## 2017-11-23 DIAGNOSIS — Z7901 Long term (current) use of anticoagulants: Secondary | ICD-10-CM | POA: Insufficient documentation

## 2017-11-23 DIAGNOSIS — N400 Enlarged prostate without lower urinary tract symptoms: Secondary | ICD-10-CM | POA: Insufficient documentation

## 2017-11-23 DIAGNOSIS — Z9852 Vasectomy status: Secondary | ICD-10-CM | POA: Insufficient documentation

## 2017-11-23 DIAGNOSIS — I10 Essential (primary) hypertension: Secondary | ICD-10-CM | POA: Diagnosis not present

## 2017-11-23 DIAGNOSIS — M4810 Ankylosing hyperostosis [Forestier], site unspecified: Secondary | ICD-10-CM | POA: Diagnosis not present

## 2017-11-23 DIAGNOSIS — J42 Unspecified chronic bronchitis: Secondary | ICD-10-CM | POA: Diagnosis not present

## 2017-11-23 DIAGNOSIS — Z9841 Cataract extraction status, right eye: Secondary | ICD-10-CM | POA: Diagnosis not present

## 2017-11-23 DIAGNOSIS — Z880 Allergy status to penicillin: Secondary | ICD-10-CM | POA: Insufficient documentation

## 2017-11-23 DIAGNOSIS — Z79899 Other long term (current) drug therapy: Secondary | ICD-10-CM | POA: Diagnosis not present

## 2017-11-23 DIAGNOSIS — K219 Gastro-esophageal reflux disease without esophagitis: Secondary | ICD-10-CM | POA: Insufficient documentation

## 2017-11-23 DIAGNOSIS — G4730 Sleep apnea, unspecified: Secondary | ICD-10-CM | POA: Insufficient documentation

## 2017-11-23 DIAGNOSIS — Z7951 Long term (current) use of inhaled steroids: Secondary | ICD-10-CM | POA: Diagnosis not present

## 2017-11-23 DIAGNOSIS — Z86718 Personal history of other venous thrombosis and embolism: Secondary | ICD-10-CM | POA: Diagnosis not present

## 2017-11-23 HISTORY — PX: CATARACT EXTRACTION W/PHACO: SHX586

## 2017-11-23 LAB — GLUCOSE, CAPILLARY
Glucose-Capillary: 115 mg/dL — ABNORMAL HIGH (ref 70–99)
Glucose-Capillary: 115 mg/dL — ABNORMAL HIGH (ref 70–99)

## 2017-11-23 SURGERY — PHACOEMULSIFICATION, CATARACT, WITH IOL INSERTION
Anesthesia: Monitor Anesthesia Care | Site: Eye | Laterality: Left | Wound class: "Clean "

## 2017-11-23 MED ORDER — ONDANSETRON HCL 4 MG/2ML IJ SOLN: 4.0000 mg | Freq: Once | INTRAMUSCULAR | Status: DC | PRN

## 2017-11-23 MED ORDER — MIDAZOLAM HCL 2 MG/2ML IJ SOLN
INTRAMUSCULAR | Status: DC | PRN
  Administered 2017-11-23 (×2): 1 mg via INTRAVENOUS

## 2017-11-23 MED ORDER — MOXIFLOXACIN HCL 0.5 % OP SOLN
1.0000 [drp] | OPHTHALMIC | Status: DC | PRN
  Administered 2017-11-23 (×3): 1 [drp] via OPHTHALMIC

## 2017-11-23 MED ORDER — NA HYALUR & NA CHOND-NA HYALUR 0.4-0.35 ML IO KIT
PACK | INTRAOCULAR | Status: DC | PRN
  Administered 2017-11-23: 1 mL via INTRAOCULAR

## 2017-11-23 MED ORDER — OXYCODONE HCL 5 MG PO TABS: 5.0000 mg | ORAL_TABLET | Freq: Once | ORAL | Status: DC | PRN

## 2017-11-23 MED ORDER — LACTATED RINGERS IV SOLN: 10.0000 mL/h | INTRAVENOUS | Status: DC

## 2017-11-23 MED ORDER — MOXIFLOXACIN HCL 0.5 % OP SOLN
OPHTHALMIC | Status: DC | PRN
  Administered 2017-11-23: 0.2 mL via OPHTHALMIC

## 2017-11-23 MED ORDER — LIDOCAINE HCL (PF) 2 % IJ SOLN
INTRAOCULAR | Status: DC | PRN
  Administered 2017-11-23: 1 mL

## 2017-11-23 MED ORDER — BRIMONIDINE TARTRATE-TIMOLOL 0.2-0.5 % OP SOLN
OPHTHALMIC | Status: DC | PRN
  Administered 2017-11-23: 1 [drp] via OPHTHALMIC

## 2017-11-23 MED ORDER — CYCLOPENTOLATE HCL 2 % OP SOLN
1.0000 [drp] | OPHTHALMIC | Status: DC | PRN
  Administered 2017-11-23 (×3): 1 [drp] via OPHTHALMIC

## 2017-11-23 MED ORDER — OXYCODONE HCL 5 MG/5ML PO SOLN: 5.0000 mg | Freq: Once | ORAL | Status: DC | PRN

## 2017-11-23 MED ORDER — EPINEPHRINE PF 1 MG/ML IJ SOLN
INTRAOCULAR | Status: DC | PRN
  Administered 2017-11-23: 67 mL via OPHTHALMIC

## 2017-11-23 MED ORDER — PHENYLEPHRINE HCL 10 % OP SOLN
1.0000 [drp] | OPHTHALMIC | Status: DC | PRN
  Administered 2017-11-23 (×3): 1 [drp] via OPHTHALMIC

## 2017-11-23 MED ORDER — FENTANYL CITRATE (PF) 100 MCG/2ML IJ SOLN
INTRAMUSCULAR | Status: DC | PRN
  Administered 2017-11-23: 50 ug via INTRAVENOUS

## 2017-11-23 MED ORDER — TETRACAINE HCL 0.5 % OP SOLN
1.0000 [drp] | OPHTHALMIC | Status: DC | PRN
  Administered 2017-11-23 (×2): 1 [drp] via OPHTHALMIC

## 2017-11-23 SURGICAL SUPPLY — 20 items
CANNULA ANT/CHMB 27G (MISCELLANEOUS) ×1 IMPLANT
CANNULA ANT/CHMB 27GA (MISCELLANEOUS) ×3 IMPLANT
GLOVE SURG LX 7.5 STRW (GLOVE) ×2
GLOVE SURG LX STRL 7.5 STRW (GLOVE) ×1 IMPLANT
GLOVE SURG TRIUMPH 8.0 PF LTX (GLOVE) ×3 IMPLANT
GOWN STRL REUS W/ TWL LRG LVL3 (GOWN DISPOSABLE) ×2 IMPLANT
GOWN STRL REUS W/TWL LRG LVL3 (GOWN DISPOSABLE) ×4
LENS IOL TECNIS ITEC 19.5 (Intraocular Lens) ×2 IMPLANT
MARKER SKIN DUAL TIP RULER LAB (MISCELLANEOUS) ×3 IMPLANT
NDL FILTER BLUNT 18X1 1/2 (NEEDLE) ×1 IMPLANT
NEEDLE FILTER BLUNT 18X 1/2SAF (NEEDLE) ×2
NEEDLE FILTER BLUNT 18X1 1/2 (NEEDLE) ×1 IMPLANT
PACK CATARACT BRASINGTON (MISCELLANEOUS) ×3 IMPLANT
PACK EYE AFTER SURG (MISCELLANEOUS) ×3 IMPLANT
PACK OPTHALMIC (MISCELLANEOUS) ×3 IMPLANT
SYR 3ML LL SCALE MARK (SYRINGE) ×3 IMPLANT
SYR 5ML LL (SYRINGE) ×3 IMPLANT
SYR TB 1ML LUER SLIP (SYRINGE) ×3 IMPLANT
WATER STERILE IRR 500ML POUR (IV SOLUTION) ×3 IMPLANT
WIPE NON LINTING 3.25X3.25 (MISCELLANEOUS) ×3 IMPLANT

## 2017-11-23 NOTE — Anesthesia Preprocedure Evaluation (Addendum)
Anesthesia Evaluation  Patient identified by MRN, date of birth, ID band Patient awake    Reviewed: Allergy & Precautions, H&P , NPO status , Patient's Chart, lab work & pertinent test results  History of Anesthesia Complications (+) PONV and history of anesthetic complications  Airway Mallampati: II  TM Distance: >3 FB Neck ROM: full    Dental no notable dental hx.    Pulmonary neg pulmonary ROS,    Pulmonary exam normal breath sounds clear to auscultation       Cardiovascular hypertension, Normal cardiovascular exam Rhythm:regular Rate:Normal     Neuro/Psych    GI/Hepatic Neg liver ROS, Medicated,  Endo/Other  diabetes, Well Controlled  Renal/GU      Musculoskeletal   Abdominal   Peds  Hematology   Anesthesia Other Findings   Reproductive/Obstetrics                            Anesthesia Physical Anesthesia Plan  ASA: II  Anesthesia Plan: MAC   Post-op Pain Management:    Induction:   PONV Risk Score and Plan:   Airway Management Planned:   Additional Equipment:   Intra-op Plan:   Post-operative Plan:   Informed Consent: I have reviewed the patients History and Physical, chart, labs and discussed the procedure including the risks, benefits and alternatives for the proposed anesthesia with the patient or authorized representative who has indicated his/her understanding and acceptance.     Plan Discussed with:   Anesthesia Plan Comments:         Anesthesia Quick Evaluation

## 2017-11-23 NOTE — Transfer of Care (Signed)
Immediate Anesthesia Transfer of Care Note  Patient: Lance [name]  Procedure(s) Performed: CATARACT EXTRACTION PHACO AND INTRAOCULAR LENS PLACEMENT (IOC)  LEFT DIABETIC (Left Eye)  Patient Location: PACU  Anesthesia Type: MAC  Level of Consciousness: awake, alert  and patient cooperative  Airway and Oxygen Therapy: Patient Spontanous Breathing and Patient connected to supplemental oxygen  Post-op Assessment: Post-op Vital signs reviewed, Patient's Cardiovascular Status Stable, Respiratory Function Stable, Patent Airway and No signs of Nausea or vomiting  Post-op Vital Signs: Reviewed and stable  Complications: No apparent anesthesia complications

## 2017-11-23 NOTE — Op Note (Signed)
OPERATIVE NOTE  Lance [name] [id number] 11/23/2017   PREOPERATIVE DIAGNOSIS:  Nuclear sclerotic cataract left eye. H25.12   POSTOPERATIVE DIAGNOSIS:    Nuclear sclerotic cataract left eye.     PROCEDURE:  Phacoemusification with posterior chamber intraocular lens placement of the left eye   LENS:   Implant Name Type Inv. Item Serial No. Manufacturer Lot No. LRB No. Used  LENS IOL DIOP 19.5 - [id number] Intraocular Lens LENS IOL DIOP 19.5 [id number] AMO  Left 1        ULTRASOUND TIME: 11  % of 1 minutes 11 seconds, CDE 8.1  SURGEON:  Wyonia Hough, MD   ANESTHESIA:  Topical with tetracaine drops and 2% Xylocaine jelly, augmented with 1% preservative-free intracameral lidocaine.    COMPLICATIONS:  None.   DESCRIPTION OF PROCEDURE:  The patient was identified in the holding room and transported to the operating room and placed in the supine position under the operating microscope.  The left eye was identified as the operative eye and it was prepped and draped in the usual sterile ophthalmic fashion.   A 1 millimeter clear-corneal paracentesis was made at the 1:30 position.  0.5 ml of preservative-free 1% lidocaine was injected into the anterior chamber.  The anterior chamber was filled with Viscoat viscoelastic.  A 2.4 millimeter keratome was used to make a near-clear corneal incision at the 10:30 position.  .  A curvilinear capsulorrhexis was made with a cystotome and capsulorrhexis forceps.  Balanced salt solution was used to hydrodissect and hydrodelineate the nucleus.   Phacoemulsification was then used in stop and chop fashion to remove the lens nucleus and epinucleus.  The remaining cortex was then removed using the irrigation and aspiration handpiece. Provisc was then placed into the capsular bag to distend it for lens placement.  A lens was then injected into the capsular bag.  The remaining viscoelastic was aspirated.   Wounds were hydrated with balanced salt  solution.  The anterior chamber was inflated to a physiologic pressure with balanced salt solution.  No wound leaks were noted. Vigamox 0.2 ml of a 1mg  per ml solution was injected into the anterior chamber for a dose of 0.2 mg of intracameral antibiotic at the completion of the case.   Timolol and Brimonidine drops were applied to the eye.  The patient was taken to the recovery room in stable condition without complications of anesthesia or surgery.  [name] 11/23/2017, 8:52 AM

## 2017-11-23 NOTE — Anesthesia Procedure Notes (Signed)
Procedure Name: MAC Date/Time: 11/23/2017 8:38 AM Performed by: Lind Guest, CRNA Pre-anesthesia Checklist: Patient identified, Emergency Drugs available, Suction available, Patient being monitored and Timeout performed Patient Re-evaluated:Patient Re-evaluated prior to induction Oxygen Delivery Method: Nasal cannula

## 2017-11-23 NOTE — Anesthesia Postprocedure Evaluation (Signed)
Anesthesia Post Note  Patient: Lance [name]  Procedure(s) Performed: CATARACT EXTRACTION PHACO AND INTRAOCULAR LENS PLACEMENT (IOC)  LEFT DIABETIC (Left Eye)  Patient location during evaluation: PACU Anesthesia Type: MAC Level of consciousness: awake and alert Pain management: pain level controlled Vital Signs Assessment: post-procedure vital signs reviewed and stable Respiratory status: spontaneous breathing Cardiovascular status: blood pressure returned to baseline Postop Assessment: no headache Anesthetic complications: no    Jaci Standard, III,  Keeli Roberg D

## 2017-11-23 NOTE — H&P (Signed)
The History and Physical notes are on paper, have been signed, and are to be scanned. The patient remains stable and unchanged from the H&P.   Previous H&P reviewed, patient examined, and there are no changes.  Press Casale 11/23/2017 8:12 AM

## 2017-11-24 ENCOUNTER — Encounter: Payer: Self-pay | Admitting: Ophthalmology

## 2017-12-27 ENCOUNTER — Other Ambulatory Visit: Payer: Medicare Other

## 2017-12-27 DIAGNOSIS — E291 Testicular hypofunction: Secondary | ICD-10-CM

## 2017-12-27 DIAGNOSIS — E349 Endocrine disorder, unspecified: Secondary | ICD-10-CM

## 2017-12-28 ENCOUNTER — Telehealth: Payer: Self-pay | Admitting: Family Medicine

## 2017-12-28 DIAGNOSIS — M109 Gout, unspecified: Secondary | ICD-10-CM | POA: Insufficient documentation

## 2017-12-28 DIAGNOSIS — Z8669 Personal history of other diseases of the nervous system and sense organs: Secondary | ICD-10-CM | POA: Insufficient documentation

## 2017-12-28 LAB — HEMATOCRIT: Hematocrit: 44.6 % (ref 37.5–51.0)

## 2017-12-28 LAB — HEMOGLOBIN: Hemoglobin: 14.5 g/dL (ref 13.0–17.7)

## 2017-12-28 LAB — TESTOSTERONE: Testosterone: 86 ng/dL — ABNORMAL LOW (ref 264–916)

## 2017-12-28 NOTE — Progress Notes (Signed)
This is a 67 -year-old male with hypogonadism and he is managed with Testopel. He presents today for Testopel insertion.  Patient is placed on the exam table in the right lateral jackknife position.  Identified upper outer quadrant of hip for insertion; prepped area with Betadine and injected 10 cc's of Lidocaine 1% with Epinephrine to anesthetize superficially and distally along trocar tract.  Made 3 mm incision using 15 blade of scalpel; trocar with sharp ended stylet was inserted into subcutaneous tissue in line with femur. Sharp stylet was withdrawn and 6 pellets were placed into trocar well. Testopel pellets advanced into tissue using blunt ended stylet. Trocar removed and incision closed using 6 Steri-Strips. Cleansed area to remove Betadin and covered Steri-Strips with outer Band-Aid.  Careful inspection of insertion is done and patient informed of post procedure instructions.  Advised patient to apply ice to the site for 20-30 minutes every hour if needed.  Avoid hot tubes, swimming or full water immersion of the insertion site for 72 hours.  Bandage may be removed after one week.    Patient is advised to contact the office if experiencing drainage of the insertion site, excessive redness or swelling of the site, chills and/or fevers > 101.5, nausea or vomiting, dizziness or lightheadedness and excessive tenderness.  Avoid strenuous activity and heavy lifting for 72 hours.     He will return in one month for serum testosterone.

## 2017-12-28 NOTE — Telephone Encounter (Signed)
Lab added

## 2017-12-28 NOTE — Telephone Encounter (Signed)
-----   Message from Nori Riis, PA-C sent at 12/28/2017  8:16 AM EDT ----- Would you add a PSA to his blood work?

## 2017-12-29 ENCOUNTER — Ambulatory Visit (INDEPENDENT_AMBULATORY_CARE_PROVIDER_SITE_OTHER): Payer: Medicare Other | Admitting: Urology

## 2017-12-29 ENCOUNTER — Encounter: Payer: Self-pay | Admitting: Urology

## 2017-12-29 VITALS — BP 106/57 | HR 80 | Ht 73.0 in | Wt 297.0 lb

## 2017-12-29 DIAGNOSIS — E349 Endocrine disorder, unspecified: Secondary | ICD-10-CM

## 2018-02-02 ENCOUNTER — Other Ambulatory Visit: Payer: Medicare Other

## 2018-02-02 ENCOUNTER — Other Ambulatory Visit: Payer: Self-pay

## 2018-02-02 DIAGNOSIS — E349 Endocrine disorder, unspecified: Secondary | ICD-10-CM

## 2018-02-03 ENCOUNTER — Telehealth: Payer: Self-pay | Admitting: Urology

## 2018-02-03 LAB — TESTOSTERONE: Testosterone: 487 ng/dL (ref 264–916)

## 2018-02-03 NOTE — Telephone Encounter (Signed)
-----   Message from Nori Riis, PA-C sent at 02/03/2018  7:43 AM EDT ----- Please schedule Mr. Altamura next Testopel for late October early November.

## 2018-02-03 NOTE — Telephone Encounter (Signed)
App made and patient is aware  MB

## 2018-03-02 ENCOUNTER — Ambulatory Visit (INDEPENDENT_AMBULATORY_CARE_PROVIDER_SITE_OTHER): Payer: Medicare Other | Admitting: Urology

## 2018-03-02 ENCOUNTER — Encounter: Payer: Self-pay | Admitting: Urology

## 2018-03-02 VITALS — BP 105/69 | HR 76 | Ht 73.0 in | Wt 304.1 lb

## 2018-03-02 DIAGNOSIS — E349 Endocrine disorder, unspecified: Secondary | ICD-10-CM

## 2018-03-02 MED ORDER — TESTOSTERONE 75 MG IL PLLT: 75.0000 mg | PELLET | Freq: Once | Status: DC

## 2018-03-02 MED ORDER — TESTOSTERONE 75 MG IL PLLT
75.0000 mg | PELLET | Freq: Once | Status: AC
  Administered 2018-03-02: 75 mg

## 2018-03-02 NOTE — Progress Notes (Signed)
This is a 67 -year-old male with hypogonadism and he is managed with Testopel. He presents today for Testopel insertion.  Patient is placed on the exam table in the left lateral jackknife position.  Identified upper outer quadrant of hip for insertion; prepped area with Betadine and injected 10 cc's of Lidocaine 1%  to anesthetize superficially and distally along trocar tract.  Made 3 mm incision using 15 blade of scalpel; trocar with sharp ended stylet was inserted into subcutaneous tissue in line with femur. Sharp stylet was withdrawn and 3 pellets were placed into trocar well. Testopel pellets advanced into tissue using blunt ended stylet. Trocar removed and incision closed using 6 Steri-Strips. Cleansed area to remove Betadine and covered Steri-Strips with outer Band-Aid.  Careful inspection of insertion is done and patient informed of post procedure instructions.  Advised patient to apply ice to the site for 20-30 minutes every hour if needed.  Avoid hot tubes, swimming or full water immersion of the insertion site for 72 hours.  Bandage may be removed after one week.    Patient is advised to contact the office if experiencing drainage of the insertion site, excessive redness or swelling of the site, chills and/or fevers > 101.5, nausea or vomiting, dizziness or lightheadedness and excessive tenderness.  Avoid strenuous activity and heavy lifting for 72 hours.     He will return in one month for serum testosterone before 9:00am.

## 2018-04-03 ENCOUNTER — Other Ambulatory Visit: Payer: Self-pay

## 2018-04-03 DIAGNOSIS — E291 Testicular hypofunction: Secondary | ICD-10-CM

## 2018-04-03 DIAGNOSIS — E349 Endocrine disorder, unspecified: Secondary | ICD-10-CM

## 2018-04-04 ENCOUNTER — Other Ambulatory Visit: Payer: Medicare Other

## 2018-04-04 DIAGNOSIS — E291 Testicular hypofunction: Secondary | ICD-10-CM

## 2018-04-04 DIAGNOSIS — E349 Endocrine disorder, unspecified: Secondary | ICD-10-CM

## 2018-04-05 LAB — TESTOSTERONE: Testosterone: 497 ng/dL (ref 264–916)

## 2018-04-11 LAB — PSA: PROSTATE SPECIFIC AG, SERUM: 0.4 ng/mL (ref 0.0–4.0)

## 2018-04-11 LAB — SPECIMEN STATUS REPORT

## 2018-06-15 ENCOUNTER — Telehealth: Payer: Self-pay | Admitting: Urology

## 2018-06-15 NOTE — Telephone Encounter (Signed)
Pt LMOM he states that [facility] will be calling to get new rx for meds. Just FYI.

## 2018-06-20 ENCOUNTER — Other Ambulatory Visit: Payer: Self-pay | Admitting: Family Medicine

## 2018-06-20 DIAGNOSIS — N401 Enlarged prostate with lower urinary tract symptoms: Principal | ICD-10-CM

## 2018-06-20 DIAGNOSIS — N138 Other obstructive and reflux uropathy: Secondary | ICD-10-CM

## 2018-06-20 MED ORDER — TAMSULOSIN HCL 0.4 MG PO CAPS: 0.4000 mg | ORAL_CAPSULE | Freq: Every day | ORAL | 3 refills | Status: DC

## 2018-06-30 ENCOUNTER — Other Ambulatory Visit: Payer: Self-pay

## 2018-06-30 DIAGNOSIS — E291 Testicular hypofunction: Secondary | ICD-10-CM

## 2018-06-30 DIAGNOSIS — E349 Endocrine disorder, unspecified: Secondary | ICD-10-CM

## 2018-06-30 DIAGNOSIS — N401 Enlarged prostate with lower urinary tract symptoms: Secondary | ICD-10-CM

## 2018-07-03 ENCOUNTER — Other Ambulatory Visit: Payer: Medicare Other

## 2018-07-03 DIAGNOSIS — N401 Enlarged prostate with lower urinary tract symptoms: Secondary | ICD-10-CM

## 2018-07-03 DIAGNOSIS — E349 Endocrine disorder, unspecified: Secondary | ICD-10-CM

## 2018-07-03 DIAGNOSIS — E291 Testicular hypofunction: Secondary | ICD-10-CM

## 2018-07-04 LAB — TESTOSTERONE: TESTOSTERONE: 105 ng/dL — AB (ref 264–916)

## 2018-07-04 LAB — HEMOGLOBIN AND HEMATOCRIT, BLOOD
HEMATOCRIT: 46.9 % (ref 37.5–51.0)
HEMOGLOBIN: 15.7 g/dL (ref 13.0–17.7)

## 2018-07-04 LAB — PSA: PROSTATE SPECIFIC AG, SERUM: 0.3 ng/mL (ref 0.0–4.0)

## 2018-07-06 ENCOUNTER — Ambulatory Visit (INDEPENDENT_AMBULATORY_CARE_PROVIDER_SITE_OTHER): Payer: Medicare Other | Admitting: Urology

## 2018-07-06 ENCOUNTER — Encounter: Payer: Self-pay | Admitting: Urology

## 2018-07-06 VITALS — BP 146/74 | HR 83 | Ht 73.0 in | Wt 300.0 lb

## 2018-07-06 DIAGNOSIS — N529 Male erectile dysfunction, unspecified: Secondary | ICD-10-CM

## 2018-07-06 DIAGNOSIS — E349 Endocrine disorder, unspecified: Secondary | ICD-10-CM

## 2018-07-06 DIAGNOSIS — N401 Enlarged prostate with lower urinary tract symptoms: Secondary | ICD-10-CM | POA: Diagnosis not present

## 2018-07-06 LAB — BLADDER SCAN AMB NON-IMAGING

## 2018-07-06 MED ORDER — TADALAFIL 5 MG PO TABS: 5.0000 mg | ORAL_TABLET | Freq: Every day | ORAL | 11 refills | Status: DC | PRN

## 2018-07-06 NOTE — Progress Notes (Signed)
07/06/2018  9:58 AM   Lance [name] [birth year] [id number]  Referring provider: Ezequiel Kayser, MD [place] [facility] [name], [place] [address]  Chief Complaint  Patient presents with  . Benign Prostatic Hypertrophy  . testosterone deficiency    HPI: Patient is a 68 year old diabetic Caucasian male with a history of testosterone deficiency, BPH with L UTS and erectile dysfunction who presents today for a follow up.     Testosterone deficiency Patient is experiencing a decrease in libido, a lack of energy, a decrease in strength, a loss in height, a decreased enjoyment in life,  erections being less strong and a recent deterioration in their work performance.  This is indicated by his responses to the ADAM questionnaire.  He is no longer having spontaneous erections at night.   He has sleep apnea and is sleeping with a CPAP machine.  His current testosterone level is 389 ng/dL on 07/19/2017.   His HBG and HCT are normal.  He is currently managing his hypogonadism with Testopel q 3 months.    His most recent testosterone level was 105 taken on 07/03/2018.   BPH WITH LUTS  (prostate and/or bladder) His IPSS score today is 16/3, which is moderate lower urinary tract symptomatology. His PVR is 27 mL. His previous IPSS score was 15.   He reports of a intermittent urinary stream, increased frequency with emptying differentdifferent volumes, straining. His normal daily fluid intake is a can of V8 juice in the morning, soda (Coke) and tea at lunch and tea at supper time. He does not drink a lot of water.   He reports of burning when straining to urinate and denies gross hematuria.  He currently taking tamsulosin 0.4 mg daily.    His has had HoLEP on 08/28/2014.      He also denies any recent fevers, chills, nausea or vomiting.  His most recent PSA score is 0.3.   He does not have a family history of PCa.  IPSS    Row Name 07/06/18 0900         International  Prostate Symptom Score   How often have you had the sensation of not emptying your bladder?  Less than 1 in 5     How often have you had to urinate less than every two hours?  More than half the time     How often have you found you stopped and started again several times when you urinated?  Almost always     How often have you found it difficult to postpone urination?  Less than 1 in 5 times     How often have you had a weak urinary stream?  Less than 1 in 5 times     How often have you had to strain to start urination?  Less than half the time     How many times did you typically get up at night to urinate?  2 Times     Total IPSS Score  16       Quality of Life due to urinary symptoms   If you were to spend the rest of your life with your urinary condition just the way it is now how would you feel about that?  Mixed        Score:  1-7 Mild 8-19 Moderate 20-35 Severe  Erectile dysfunction His SHIM score is 9, which is mild to moderate ED.   His previous SHIM score was 13.  He has been having difficulty with erections for several years. His risk factors for ED are [name], BPH, testosterone deficiency, DM, HTN, HLD, spinal injury, COPD, anticoagulation therapy and sleep apnea.  He denies any painful erections or curvatures with his erections.   He is still no longer having spontaneous erections.    He has used Sildenafil in the past which was effective but gives him headaches. Insurance did not cover Cialis which was equally effective and had reduced side-effects. His wife is also having health issues that has effected his sex life.   SHIM    Row Name 07/06/18 [id number]         SHIM: Over the last 6 months:   How do you rate your confidence that you could get and keep an erection?  Very Low     When you had erections with sexual stimulation, how often were your erections hard enough for penetration (entering your partner)?  A Few Times (much less than half the time)     During sexual  intercourse, how often were you able to maintain your erection after you had penetrated (entered) your partner?  A Few Times (much less than half the time)     During sexual intercourse, how difficult was it to maintain your erection to completion of intercourse?  Difficult     When you attempted sexual intercourse, how often was it satisfactory for you?  Almost Never or Never       SHIM Total Score   SHIM  9       Score: 1-7 Severe ED 8-11 Moderate ED 12-16 Mild-Moderate ED 17-21 Mild ED 22-25 No ED  PMH: Past Medical History:  Diagnosis Date  . Acute cystitis   . Allergic rhinitis   . BPH (benign prostatic hyperplasia)   . Bronchitis    chronic  . Carpal tunnel syndrome   . CHF (congestive heart failure) ([place])   . Complication of anesthesia    long time to wake after fatty tumor surgery  . DDD (degenerative disc disease), lumbar    whole body, joints  . Degenerative lumbar spinal stenosis    chronic back pain, left leg and foot  . Diabetes mellitus ([place])    diet controlled  . DISH (diffuse idiopathic skeletal hyperostosis)    neck pain  . Dyspnea    with exertion, wheezing occasionally  . Erectile dysfunction   . GERD (gastroesophageal reflux disease)   . Gout   . Headache    migraines-occasionally  . History of DVT (deep vein thrombosis)    left leg  . History of migraine headaches   . History of pulmonary embolism    bilateral  . HOH (hard of hearing)    wears aids  . Hypercholesterolemia   . Hypercoagulable state ([place])   . Hypertension    controlled on meds  . Hypogonadism in male   . Infection of skin   . Iron deficiency anemia   . Lower urinary tract symptoms   . Meralgia paresthetica   . Neck pain   . Obesity   . Post-void dribbling   . Prostatitis   . Sleep apnea    with CPAP  . SNHL (sensorineural hearing loss)   . Vertigo    occasional    Surgical History: Past Surgical History:  Procedure Laterality Date  . Blood vessel Surgery    .  CARPAL TUNNEL RELEASE Bilateral 2011  . CATARACT EXTRACTION W/PHACO Right 10/17/2017   Procedure: CATARACT EXTRACTION PHACO  AND INTRAOCULAR LENS PLACEMENT ([place])  RIGHT DIABETIC;  Surgeon: Leandrew Koyanagi, MD;  Location: [facility];  Service: Ophthalmology;  Laterality: Right;  CPAP Diet controlled diabetic  . CATARACT EXTRACTION W/PHACO Left 11/23/2017   Procedure: CATARACT EXTRACTION PHACO AND INTRAOCULAR LENS PLACEMENT ([place])  LEFT DIABETIC;  Surgeon: Leandrew Koyanagi, MD;  Location: [facility];  Service: Ophthalmology;  Laterality: Left;  Diabetic  sleep apnea  . COLONOSCOPY WITH PROPOFOL N/A 07/11/2015   Procedure: COLONOSCOPY WITH PROPOFOL;  Surgeon: Lollie Sails, MD;  Location[facility] ENDOSCOPY;  Service: Endoscopy;  Laterality: N/A;  . FOOT SURGERY Bilateral   . FOOT SURGERY Left 1937   complicated by DVT  . [place]  . HERNIA REPAIR Bilateral 1985   x2  . HOLEP-LASER ENUCLEATION OF THE PROSTATE WITH MORCELLATION  08/28/2014   Dr. Hollice Espy  . HYDROCELE EXCISION / REPAIR  1985   x2  . Hydrocelectomy     x 2  . JOINT REPLACEMENT     left knee, right shoulder  . LEG SURGERY Right 1974   fatty tumor  . PROSTATE SURGERY    . REPLACEMENT TOTAL KNEE Left 2012  . SHOULDER SURGERY Right 1968  . TONSILLECTOMY  1958  . TOTAL SHOULDER ARTHROPLASTY Right 09/18/2014   arthroplasty, glenohumeral joint; total shoulder (glenoid and prosimal humeral replacement)  . VASECTOMY  1979    Home Medications:  Allergies as of 07/06/2018      Reactions   Penicillins    Other reaction(s):unknown,as a child      Medication List       Accurate as of July 06, 2018  9:58 AM. Always use your most recent med list.        cyclobenzaprine 10 MG tablet Commonly known as:  FLEXERIL Take by mouth.   cyproheptadine 4 MG tablet Commonly known as:  PERIACTIN Take one tablet three hour prior to intercourse   FLUAD 0.5 ML Susy Generic drug:   Influenza Vac A&B Surf Ant Adj ADM 0.5ML IM UTD   fluticasone 50 MCG/ACT nasal spray Commonly known as:  FLONASE Reported on 07/29/2015   HYDROcodone-acetaminophen 5-325 MG tablet Commonly known as:  NORCO/VICODIN 5-325 tablets. 1/2 to 1 tablet 4 times a day   lisinopril-hydrochlorothiazide 10-12.5 MG tablet Commonly known as:  PRINZIDE,ZESTORETIC Take 1 tablet by mouth daily. am   MULTIVITAMIN ADULT PO Take by mouth.   NEURONTIN 300 MG capsule Generic drug:  gabapentin Take 300 mg by mouth 4 (four) times daily.   pantoprazole 40 MG tablet Commonly known as:  PROTONIX 40 mg 2 (two) times daily.   pravastatin 20 MG tablet Commonly known as:  PRAVACHOL Take 20 mg by mouth daily.   PROAIR HFA 108 (90 Base) MCG/ACT inhaler Generic drug:  albuterol Inhale into the lungs. Reported on 07/29/2015   sildenafil 20 MG tablet Commonly known as:  REVATIO Take 3 to 5 tablets two hours before intercouse on an empty stomach.  Do not take with nitrates.   tadalafil 5 MG tablet Commonly known as:  CIALIS Take 1 tablet (5 mg total) by mouth daily as needed for erectile dysfunction.   tamsulosin 0.4 MG Caps capsule Commonly known as:  FLOMAX Take 1 capsule (0.4 mg total) by mouth daily.   Testosterone 75 MG Pllt Inject into the skin.   VICTOZA 18 MG/3ML Sopn Generic drug:  liraglutide Inject into the skin.   warfarin 5 MG tablet Commonly known as:  COUMADIN Take by mouth.  1/2 to 1 a day       Allergies:  Allergies  Allergen Reactions  . Penicillins     Other reaction(s):unknown,as a child    Family History: Family History  Problem Relation [name] of Onset  . Thrombosis Unknown   . Skin cancer Father   . Kidney disease Neg Hx   . Prostate cancer Neg Hx   . Kidney cancer Neg Hx   . Bladder Cancer Neg Hx     Social History:  reports that he has never smoked. He has never used smokeless tobacco. He reports that he does not drink alcohol or use  drugs.  ROS: UROLOGY Frequent Urination?: No Hard to postpone urination?: No Burning/pain with urination?: No Get up at night to urinate?: No Leakage of urine?: No Urine stream starts and stops?: No Trouble starting stream?: No Do you have to strain to urinate?: No Blood in urine?: No Urinary tract infection?: No Sexually transmitted disease?: No Injury to kidneys or bladder?: No Painful intercourse?: No Weak stream?: No Erection problems?: Yes Penile pain?: No  Gastrointestinal Nausea?: No Vomiting?: No Indigestion/heartburn?: No Diarrhea?: Yes Constipation?: No  Constitutional Fever: No Night sweats?: No Weight loss?: No Fatigue?: No  Skin Skin rash/lesions?: No Itching?: No  Eyes Blurred vision?: No Double vision?: No  Ears/Nose/Throat Sore throat?: No Sinus problems?: No  Hematologic/Lymphatic Swollen glands?: No Easy bruising?: No  Cardiovascular Leg swelling?: Yes Chest pain?: No  Respiratory Cough?: No Shortness of breath?: No  Endocrine Excessive thirst?: No  Musculoskeletal Back pain?: Yes Joint pain?: Yes  Neurological Headaches?: No Dizziness?: No  Psychologic Depression?: No Anxiety?: No  Physical Exam: BP (!) 146/74 (BP Location: Left Arm, Patient Position: Sitting)   Pulse 83   Ht 6\' 1"  (1.854 m)   Wt 300 lb (136.1 kg)   BMI 39.58 kg/m   Constitutional:  Well nourished. Alert and oriented, No acute distress. HEENT: Grandfather AT, moist mucus membranes.  Trachea midline, no masses. Cardiovascular: No clubbing, cyanosis, or edema. Respiratory: Normal respiratory effort, no increased work of breathing. GU: No CVA tenderness.  No bladder fullness or masses.  Patient with circumcised phallus. Urethral meatus is patent.  No penile discharge. No penile lesions or rashes. Scrotum without lesions, cysts, rashes and/or edema.  Testicles are located scrotally bilaterally. No masses are appreciated in the testicles. Left and right  epididymis are normal. Rectal: Patient with  normal sphincter tone. Anus and perineum without scarring or rashes. No rectal masses are appreciated. Prostate is approximately 45 grams, no nodules are appreciated.  Skin: No rashes, bruises or suspicious lesions. Neurologic: Grossly intact, no focal deficits, moving all 4 extremities. Psychiatric: Normal mood and affect.  Laboratory Data:  PSA History  0.4 ng/mL on 12/26/2015  0.4 ng/mL on 06/11/2016  0.5 ng/mL on 01/04/2017  0.5 ng/mL on 08/08/2017  0.4 ng/mL on 01/05/2018  0.3 ng/mL on 07/03/2018     Lab Results  Component Value Date   TESTOSTERONE 105 (L) 07/03/2018   I have reviewed the labs  Pertinent Imagings:  Results for orders placed or performed in visit on 07/06/18  BLADDER SCAN AMB NON-IMAGING  Result Value Ref Range   Scan Result 35ml    Assessment & Plan:    1. Testosterone deficiency   -most recent testosterone level is 105 ng/dL on 07/03/2018 (goal 450-600 ng/dL)  -Check with insurance for testopel treatment   -RTC for Testopel - need to check insurance     2. BPH with LUTS  -  IPSS score is 16/3, it is slightly worse  - PVR is 27 mL, voiding well  - Continue conservative management, avoiding bladder irritants and timed voiding's  - Continue tamsulosin 0.4 mg daily; refills given  - RTC in 6 months for IPSS, PSA, PVR and exam, as testosterone therapy can cause prostate enlargement and worsen LUTS  3. Erectile dysfunction:     -SHIM score is 9, moderate ED   -Discontinue Sildenafil, pt cannot tolerate side-effect  -Start Cialis 5 mg; take 1 tablet by mouth as needed for ED  -RTC in 6 months for SHIM score and exam, as testosterone therapy can affect erections   Return for schedule Testopel - need to check insurance .  These notes generated with voice recognition software. I apologize for typographical errors.  [facility] [address] [place], [place] [address] [phone]  I, Lucas Mallow, am acting as a Education administrator for [employer],  I have reviewed the above documentation for accuracy and completeness, and I agree with the above.    Zara Council, PA-C

## 2018-07-14 ENCOUNTER — Ambulatory Visit
Admission: RE | Admit: 2018-07-14 | Discharge: 2018-07-14 | Disposition: A | Discharge status: Home / Self Care | Payer: Medicare Other | Source: Ambulatory Visit | Attending: Gastroenterology | Admitting: Gastroenterology

## 2018-07-14 ENCOUNTER — Other Ambulatory Visit: Payer: Self-pay | Admitting: Gastroenterology

## 2018-07-14 DIAGNOSIS — R1032 Left lower quadrant pain: Secondary | ICD-10-CM

## 2018-07-14 MED ORDER — IOHEXOL 300 MG/ML  SOLN
100.0000 mL | Freq: Once | INTRAMUSCULAR | Status: AC | PRN
  Administered 2018-07-14: 100 mL via INTRAVENOUS

## 2018-07-17 ENCOUNTER — Telehealth: Payer: Self-pay | Admitting: Urology

## 2018-07-17 ENCOUNTER — Other Ambulatory Visit
Admission: RE | Admit: 2018-07-17 | Discharge: 2018-07-17 | Disposition: A | Discharge status: Home / Self Care | Payer: Medicare Other | Source: Ambulatory Visit | Attending: Gastroenterology | Admitting: Gastroenterology

## 2018-07-17 DIAGNOSIS — R197 Diarrhea, unspecified: Secondary | ICD-10-CM | POA: Diagnosis present

## 2018-07-17 DIAGNOSIS — R1032 Left lower quadrant pain: Secondary | ICD-10-CM | POA: Diagnosis present

## 2018-07-17 LAB — GASTROINTESTINAL PANEL BY PCR, STOOL (REPLACES STOOL CULTURE)

## 2018-07-17 LAB — C DIFFICILE QUICK SCREEN W PCR REFLEX
C DIFFICILE (CDIFF) TOXIN: NEGATIVE
C DIFFICLE (CDIFF) ANTIGEN: POSITIVE — AB

## 2018-07-17 LAB — CLOSTRIDIUM DIFFICILE BY PCR, REFLEXED: CDIFFPCR: POSITIVE — AB

## 2018-07-17 NOTE — Telephone Encounter (Signed)
Pt stopped by office to see if Testopel had been approved by insurance.  He uses [employer].  He also sent a mychart message.

## 2018-07-19 NOTE — Telephone Encounter (Signed)
Patient's insurance does not require prior authorization for Testopel.  Okay to proceed with scheduling.

## 2018-07-28 ENCOUNTER — Other Ambulatory Visit: Payer: Self-pay

## 2018-07-28 ENCOUNTER — Ambulatory Visit (INDEPENDENT_AMBULATORY_CARE_PROVIDER_SITE_OTHER): Payer: Medicare Other | Admitting: Urology

## 2018-07-28 ENCOUNTER — Encounter: Payer: Self-pay | Admitting: Urology

## 2018-07-28 VITALS — BP 97/58 | HR 79 | Ht 73.0 in | Wt 300.0 lb

## 2018-07-28 DIAGNOSIS — E349 Endocrine disorder, unspecified: Secondary | ICD-10-CM | POA: Diagnosis not present

## 2018-07-28 MED ORDER — TESTOSTERONE 75 MG IL PLLT
75.0000 mg | PELLET | Freq: Once | Status: AC
  Administered 2018-07-28: 75 mg

## 2018-07-28 NOTE — Progress Notes (Signed)
This is a 68 year old male with hypogonadism and he is managed with Testopel. He presents today for Testopel insertion.  Patient is placed on the exam table in the right lateral jackknife position.  Identified upper outer quadrant of hip for insertion; prepped area with Betadine and injected 10 cc's of Lidocaine 1% with Epinephrine to anesthetize superficially and distally along trocar tract.  Made 3 mm incision using 15 blade of scalpel; trocar with sharp ended stylet was inserted into subcutaneous tissue in line with femur. Sharp stylet was withdrawn and 6 pellets were placed into trocar well. Testopel pellets advanced into tissue using blunt ended stylet. Trocar removed and incision closed using 6 Steri-Strips. Cleansed area to remove Betadine and covered Steri-Strips with outer Band-Aid.  Careful inspection of insertion is done and patient informed of post procedure instructions.  Advised patient to apply ice to the site for 20-30 minutes every hour if needed.  Avoid hot tubes, swimming or full water immersion of the insertion site for 72 hours.  Bandage may be removed after one week.    Patient is advised to contact the office if experiencing drainage of the insertion site, excessive redness or swelling of the site, chills and/or fevers > 101.5, nausea or vomiting, dizziness or lightheadedness and excessive tenderness.  Avoid strenuous activity and heavy lifting for 72 hours.     He will return in three month for serum testosterone.

## 2018-08-31 ENCOUNTER — Ambulatory Visit: Admission: RE | Admit: 2018-08-31 | Payer: Medicare Other | Source: Home / Self Care | Admitting: Gastroenterology

## 2018-08-31 ENCOUNTER — Encounter: Admission: RE | Payer: Self-pay | Source: Home / Self Care

## 2018-08-31 SURGERY — COLONOSCOPY WITH PROPOFOL
Anesthesia: General

## 2018-10-25 ENCOUNTER — Other Ambulatory Visit: Payer: Self-pay

## 2018-10-25 DIAGNOSIS — E349 Endocrine disorder, unspecified: Secondary | ICD-10-CM

## 2018-10-27 ENCOUNTER — Other Ambulatory Visit: Payer: Medicare Other

## 2018-10-27 ENCOUNTER — Telehealth: Payer: Self-pay | Admitting: Urology

## 2018-10-27 NOTE — Telephone Encounter (Signed)
Patient called the office to cancel lab appointment.  He had his testosterone drawn at his PCP today.  Just FYI.

## 2018-11-03 NOTE — Telephone Encounter (Signed)
Testopel has been scheduled patient is aware  Sharyn Lull

## 2018-11-07 NOTE — Progress Notes (Signed)
This is a 68 -year-old male with hypogonadism and he is managed with Testopel. He presents today for Testopel insertion.  Patient is placed on the exam table in the right lateral jackknife position.  Identified upper outer quadrant of hip for insertion; prepped area with Betadine and injected 10 cc's of Lidocaine 1% with Epinephrine to anesthetize superficially and distally along trocar tract.  Made 3 mm incision using 15 blade of scalpel; trocar with sharp ended stylet was inserted into subcutaneous tissue in line with femur. Sharp stylet was withdrawn and 6 pellets were placed into trocar well. Testopel pellets advanced into tissue using blunt ended stylet. Trocar removed and incision closed using 6 Steri-Strips. Cleansed area to remove Betadine and covered Steri-Strips with outer Band-Aid.  Careful inspection of insertion is done and patient informed of post procedure instructions.  Advised patient to apply ice to the site for 20-30 minutes every hour if needed.  Avoid hot tubes, swimming or full water immersion of the insertion site for 72 hours.  Bandage may be removed after one week.    Patient is advised to contact the office if experiencing drainage of the insertion site, excessive redness or swelling of the site, chills and/or fevers > 101.5, nausea or vomiting, dizziness or lightheadedness and excessive tenderness.  Avoid strenuous activity and heavy lifting for 72 hours.     He will return in one month for serum testosterone before 9:00am.

## 2018-11-08 ENCOUNTER — Other Ambulatory Visit: Payer: Self-pay

## 2018-11-08 ENCOUNTER — Ambulatory Visit (INDEPENDENT_AMBULATORY_CARE_PROVIDER_SITE_OTHER): Payer: Medicare Other | Admitting: Urology

## 2018-11-08 ENCOUNTER — Encounter: Payer: Self-pay | Admitting: Urology

## 2018-11-08 VITALS — BP 123/73 | HR 88 | Ht 73.0 in | Wt 305.0 lb

## 2018-11-08 DIAGNOSIS — E349 Endocrine disorder, unspecified: Secondary | ICD-10-CM | POA: Diagnosis not present

## 2018-12-13 ENCOUNTER — Other Ambulatory Visit: Payer: Self-pay | Admitting: Physical Medicine and Rehabilitation

## 2018-12-13 DIAGNOSIS — M5416 Radiculopathy, lumbar region: Secondary | ICD-10-CM

## 2018-12-14 ENCOUNTER — Other Ambulatory Visit: Payer: Self-pay

## 2018-12-14 ENCOUNTER — Other Ambulatory Visit: Payer: Medicare Other

## 2018-12-14 DIAGNOSIS — E349 Endocrine disorder, unspecified: Secondary | ICD-10-CM

## 2018-12-15 ENCOUNTER — Telehealth: Payer: Self-pay

## 2018-12-15 LAB — TESTOSTERONE: Testosterone: 374 ng/dL (ref 264–916)

## 2018-12-15 NOTE — Telephone Encounter (Signed)
-----   Message from Nori Riis, PA-C sent at 12/15/2018  7:58 AM EDT ----- Please let Mr. Rounsaville know that his testosterone level is within normal limits.  We need to get him scheduled for his next Testopel in 2 months.

## 2018-12-15 NOTE — Telephone Encounter (Signed)
mychart notification sent 

## 2018-12-15 NOTE — Telephone Encounter (Signed)
I will check and let you know  Lance [name]

## 2018-12-25 ENCOUNTER — Ambulatory Visit
Admission: RE | Admit: 2018-12-25 | Discharge: 2018-12-25 | Disposition: A | Discharge status: Home / Self Care | Payer: Medicare Other | Source: Ambulatory Visit | Attending: Physical Medicine and Rehabilitation | Admitting: Physical Medicine and Rehabilitation

## 2018-12-25 ENCOUNTER — Other Ambulatory Visit: Payer: Self-pay

## 2018-12-25 DIAGNOSIS — M5416 Radiculopathy, lumbar region: Secondary | ICD-10-CM | POA: Insufficient documentation

## 2019-01-19 ENCOUNTER — Other Ambulatory Visit: Payer: Self-pay | Admitting: Orthopedic Surgery

## 2019-01-19 DIAGNOSIS — T849XXA Unspecified complication of internal orthopedic prosthetic device, implant and graft, initial encounter: Secondary | ICD-10-CM

## 2019-01-26 ENCOUNTER — Ambulatory Visit
Admission: RE | Admit: 2019-01-26 | Discharge: 2019-01-26 | Disposition: A | Discharge status: Home / Self Care | Payer: Medicare Other | Source: Ambulatory Visit | Attending: Orthopedic Surgery | Admitting: Orthopedic Surgery

## 2019-01-26 ENCOUNTER — Other Ambulatory Visit: Payer: Self-pay

## 2019-01-26 DIAGNOSIS — T849XXA Unspecified complication of internal orthopedic prosthetic device, implant and graft, initial encounter: Secondary | ICD-10-CM | POA: Insufficient documentation

## 2019-01-30 ENCOUNTER — Other Ambulatory Visit
Admission: RE | Admit: 2019-01-30 | Discharge: 2019-01-30 | Disposition: A | Discharge status: Home / Self Care | Payer: Medicare Other | Source: Ambulatory Visit | Attending: Gastroenterology | Admitting: Gastroenterology

## 2019-01-30 ENCOUNTER — Other Ambulatory Visit: Payer: Self-pay

## 2019-01-30 ENCOUNTER — Ambulatory Visit: Payer: Medicare Other

## 2019-01-30 DIAGNOSIS — Z20828 Contact with and (suspected) exposure to other viral communicable diseases: Secondary | ICD-10-CM | POA: Insufficient documentation

## 2019-01-30 DIAGNOSIS — Z01812 Encounter for preprocedural laboratory examination: Secondary | ICD-10-CM | POA: Insufficient documentation

## 2019-01-30 LAB — SARS CORONAVIRUS 2 (TAT 6-24 HRS): SARS Coronavirus 2: NEGATIVE

## 2019-02-02 ENCOUNTER — Ambulatory Visit: Payer: Medicare Other | Admitting: Certified Registered Nurse Anesthetist

## 2019-02-02 ENCOUNTER — Encounter
Admission: RE | Disposition: A | Discharge status: Home / Self Care | Payer: Self-pay | Source: Home / Self Care | Attending: Gastroenterology

## 2019-02-02 ENCOUNTER — Encounter: Payer: Self-pay | Admitting: *Deleted

## 2019-02-02 ENCOUNTER — Ambulatory Visit
Admission: RE | Admit: 2019-02-02 | Discharge: 2019-02-02 | Disposition: A | Discharge status: Home / Self Care | Payer: Medicare Other | Attending: Gastroenterology | Admitting: Gastroenterology

## 2019-02-02 DIAGNOSIS — Z86711 Personal history of pulmonary embolism: Secondary | ICD-10-CM | POA: Insufficient documentation

## 2019-02-02 DIAGNOSIS — Z79899 Other long term (current) drug therapy: Secondary | ICD-10-CM | POA: Insufficient documentation

## 2019-02-02 DIAGNOSIS — Z880 Allergy status to penicillin: Secondary | ICD-10-CM | POA: Insufficient documentation

## 2019-02-02 DIAGNOSIS — G4730 Sleep apnea, unspecified: Secondary | ICD-10-CM | POA: Diagnosis not present

## 2019-02-02 DIAGNOSIS — D123 Benign neoplasm of transverse colon: Secondary | ICD-10-CM | POA: Insufficient documentation

## 2019-02-02 DIAGNOSIS — Z79891 Long term (current) use of opiate analgesic: Secondary | ICD-10-CM | POA: Insufficient documentation

## 2019-02-02 DIAGNOSIS — Z86010 Personal history of colonic polyps: Secondary | ICD-10-CM | POA: Insufficient documentation

## 2019-02-02 DIAGNOSIS — Z7984 Long term (current) use of oral hypoglycemic drugs: Secondary | ICD-10-CM | POA: Diagnosis not present

## 2019-02-02 DIAGNOSIS — N401 Enlarged prostate with lower urinary tract symptoms: Secondary | ICD-10-CM | POA: Insufficient documentation

## 2019-02-02 DIAGNOSIS — I509 Heart failure, unspecified: Secondary | ICD-10-CM | POA: Insufficient documentation

## 2019-02-02 DIAGNOSIS — Z6838 Body mass index (BMI) 38.0-38.9, adult: Secondary | ICD-10-CM | POA: Diagnosis not present

## 2019-02-02 DIAGNOSIS — I110 Hypertensive heart disease with heart failure: Secondary | ICD-10-CM | POA: Insufficient documentation

## 2019-02-02 DIAGNOSIS — Z86718 Personal history of other venous thrombosis and embolism: Secondary | ICD-10-CM | POA: Diagnosis not present

## 2019-02-02 DIAGNOSIS — E119 Type 2 diabetes mellitus without complications: Secondary | ICD-10-CM | POA: Diagnosis not present

## 2019-02-02 DIAGNOSIS — K635 Polyp of colon: Secondary | ICD-10-CM | POA: Diagnosis not present

## 2019-02-02 DIAGNOSIS — K219 Gastro-esophageal reflux disease without esophagitis: Secondary | ICD-10-CM | POA: Insufficient documentation

## 2019-02-02 DIAGNOSIS — J449 Chronic obstructive pulmonary disease, unspecified: Secondary | ICD-10-CM | POA: Insufficient documentation

## 2019-02-02 DIAGNOSIS — K648 Other hemorrhoids: Secondary | ICD-10-CM | POA: Diagnosis not present

## 2019-02-02 DIAGNOSIS — E669 Obesity, unspecified: Secondary | ICD-10-CM | POA: Insufficient documentation

## 2019-02-02 DIAGNOSIS — Z7901 Long term (current) use of anticoagulants: Secondary | ICD-10-CM | POA: Diagnosis not present

## 2019-02-02 DIAGNOSIS — K5730 Diverticulosis of large intestine without perforation or abscess without bleeding: Secondary | ICD-10-CM | POA: Diagnosis not present

## 2019-02-02 HISTORY — PX: COLONOSCOPY WITH PROPOFOL: SHX5780

## 2019-02-02 LAB — PROTIME-INR
INR: 1.1 (ref 0.8–1.2)
Prothrombin Time: 14.3 seconds (ref 11.4–15.2)

## 2019-02-02 LAB — GLUCOSE, CAPILLARY: Glucose-Capillary: 89 mg/dL (ref 70–99)

## 2019-02-02 SURGERY — COLONOSCOPY WITH PROPOFOL
Anesthesia: General

## 2019-02-02 MED ORDER — LIDOCAINE HCL (CARDIAC) PF 100 MG/5ML IV SOSY
PREFILLED_SYRINGE | INTRAVENOUS | Status: DC | PRN
  Administered 2019-02-02: 100 mg via INTRAVENOUS

## 2019-02-02 MED ORDER — PROPOFOL 10 MG/ML IV BOLUS
INTRAVENOUS | Status: AC
  Filled 2019-02-02: qty 40

## 2019-02-02 MED ORDER — EPHEDRINE SULFATE 50 MG/ML IJ SOLN
INTRAMUSCULAR | Status: AC
  Filled 2019-02-02: qty 1

## 2019-02-02 MED ORDER — LIDOCAINE HCL (PF) 2 % IJ SOLN
INTRAMUSCULAR | Status: AC
  Filled 2019-02-02: qty 10

## 2019-02-02 MED ORDER — SODIUM CHLORIDE 0.9 % IV SOLN
INTRAVENOUS | Status: DC
  Administered 2019-02-02: 1000 mL via INTRAVENOUS

## 2019-02-02 MED ORDER — EPHEDRINE SULFATE 50 MG/ML IJ SOLN
INTRAMUSCULAR | Status: DC | PRN
  Administered 2019-02-02: 10 mg via INTRAVENOUS
  Administered 2019-02-02: 7.5 mg via INTRAVENOUS

## 2019-02-02 MED ORDER — PHENYLEPHRINE HCL (PRESSORS) 10 MG/ML IV SOLN
INTRAVENOUS | Status: AC
  Filled 2019-02-02: qty 1

## 2019-02-02 MED ORDER — PROPOFOL 10 MG/ML IV BOLUS
INTRAVENOUS | Status: DC | PRN
  Administered 2019-02-02: 60 mg via INTRAVENOUS

## 2019-02-02 MED ORDER — PHENYLEPHRINE HCL (PRESSORS) 10 MG/ML IV SOLN
INTRAVENOUS | Status: DC | PRN
  Administered 2019-02-02 (×7): 100 ug via INTRAVENOUS

## 2019-02-02 MED ORDER — PROPOFOL 500 MG/50ML IV EMUL
INTRAVENOUS | Status: DC | PRN
  Administered 2019-02-02: 100 ug/kg/min via INTRAVENOUS

## 2019-02-02 NOTE — Anesthesia Preprocedure Evaluation (Signed)
Anesthesia Evaluation  Patient identified by MRN, date of birth, ID band Patient awake    Reviewed: Allergy & Precautions, NPO status , Patient's Chart, lab work & pertinent test results  History of Anesthesia Complications (+) PROLONGED EMERGENCE and history of anesthetic complications  Airway Mallampati: IV       Dental   Pulmonary sleep apnea and Continuous Positive Airway Pressure Ventilation , COPD,  COPD inhaler, Not current smoker,           Cardiovascular hypertension, Pt. on medications (-) Past MI and (-) CHF (-) dysrhythmias (-) Valvular Problems/Murmurs     Neuro/Psych neg Seizures    GI/Hepatic Neg liver ROS, GERD  Medicated and Controlled,  Endo/Other  diabetes, Type 2, Oral Hypoglycemic Agents  Renal/GU negative Renal ROS     Musculoskeletal   Abdominal   Peds  Hematology  (+) anemia ,   Anesthesia Other Findings   Reproductive/Obstetrics                             Anesthesia Physical Anesthesia Plan  ASA: III  Anesthesia Plan: General   Post-op Pain Management:    Induction: Intravenous  PONV Risk Score and Plan: 2 and Propofol infusion and TIVA  Airway Management Planned: Nasal Cannula  Additional Equipment:   Intra-op Plan:   Post-operative Plan:   Informed Consent: I have reviewed the patients History and Physical, chart, labs and discussed the procedure including the risks, benefits and alternatives for the proposed anesthesia with the patient or authorized representative who has indicated his/her understanding and acceptance.       Plan Discussed with:   Anesthesia Plan Comments:         Anesthesia Quick Evaluation

## 2019-02-02 NOTE — Op Note (Signed)
[facility] Gastroenterology Patient Name: Lance [name] Procedure Date: 02/02/2019 10:06 AM MRN: [id number] Account #: [id number] Date of Birth: 01-28-1951 Admit Type: Outpatient Age: 68 Room: [facility] ENDO ROOM 1 Gender: Male Note Status: Finalized Procedure:            Colonoscopy Indications:          Personal history of colonic polyps Providers:            Lollie Sails, MD Medicines:            Monitored Anesthesia Care Complications:        No immediate complications. Procedure:            Pre-Anesthesia Assessment:                       - ASA Grade Assessment: III - A patient with severe                        systemic disease.                       After obtaining informed consent, the colonoscope was                        passed under direct vision. Throughout the procedure,                        the patient's blood pressure, pulse, and oxygen                        saturations were monitored continuously. The                        Colonoscope was introduced through the anus and                        advanced to the the terminal ileum. The colonoscopy was                        performed without difficulty. The patient tolerated the                        procedure well. The quality of the bowel preparation                        was fair. Findings:      Multiple medium-mouthed diverticula were found in the sigmoid colon and       descending colon.      Two sessile polyps were found in the recto-sigmoid colon. The polyps       were 1 to 2 mm in size. These polyps were removed with a cold biopsy       forceps. Resection and retrieval were complete.      A 3 mm polyp was found in the transverse colon. The polyp was sessile.       The polyp was removed with a cold biopsy forceps. Resection and       retrieval were complete.      Non-bleeding internal hemorrhoids were found during retroflexion and       during anoscopy. The hemorrhoids were  small.      The digital rectal exam was normal otherwise.  Impression:           - Preparation of the colon was fair.                       - Diverticulosis in the sigmoid colon and in the                        descending colon.                       - Two 1 to 2 mm polyps at the recto-sigmoid colon,                        removed with a cold biopsy forceps. Resected and                        retrieved.                       - One 3 mm polyp in the transverse colon, removed with                        a cold biopsy forceps. Resected and retrieved.                       - Non-bleeding internal hemorrhoids. Recommendation:       - Discharge patient to home.                       - Telephone GI clinic for pathology results in 5 days. Procedure Code(s):    --- Professional ---                       [phone], Colonoscopy, flexible; with biopsy, single or                        multiple Diagnosis Code(s):    --- Professional ---                       K64.8, Other hemorrhoids                       K63.5, Polyp of colon                       Z86.010, Personal history of colonic polyps                       K57.30, Diverticulosis of large intestine without                        perforation or abscess without bleeding CPT copyright 2019 [employer]. All rights reserved. The codes documented in this report are preliminary and upon coder review may  be revised to meet current compliance requirements. Lollie Sails, MD 02/02/2019 10:52:22 AM This report has been signed electronically. Number of Addenda: 0 Note Initiated On: 02/02/2019 10:06 AM Scope Withdrawal Time: 0 hours 7 minutes 18 seconds  Total Procedure Duration: 0 hours 19 minutes 57 seconds       [facility]

## 2019-02-02 NOTE — H&P (Signed)
Outpatient short stay form Pre-procedure 02/02/2019 10:17 AM Lance Sails MD  Primary Physician: Dr. Genene Churn  Reason for visit: Colonoscopy  History of present illness: Patient is a 68 year old male presenting today for colonoscopy in regards to his personal history of adenomatous colon polyps.  His last colonoscopy was a little over 3 years ago.  He had 5 adenomas removed at that time.  Patient does take Coumadin has been off it for several days and INR was checked today, 1.1.  Takes no other blood thinning agent.  Patient states that his bowel habits are variable sometimes constipated sometimes looser.  He does have intermittent left lower quadrant pain.  He is never had diverticulitis in the past.  He does have a moderate sized right inguinal hernia containing fat and a small portion of the urinary bladder.    Current Facility-Administered Medications:  .  0.9 %  sodium chloride infusion, , Intravenous, Continuous, Lance Sails, MD, Last Rate: 20 mL/hr at 02/02/19 0950, 1,000 mL at 02/02/19 0950  Medications Prior to Admission  Medication Sig Dispense Refill Last Dose  . cetirizine (ZYRTEC) 10 MG chewable tablet Chew 10 mg by mouth daily.    at prn  . FLUAD 0.5 ML SUSY ADM 0.5ML IM UTD  0 Past Month at Unknown time  . gabapentin (NEURONTIN) 300 MG capsule Take 300 mg by mouth 4 (four) times daily.    02/02/2019 at 0600  . HYDROcodone-acetaminophen (NORCO/VICODIN) 5-325 MG per tablet 5-325 tablets. 1/2 to 1 tablet 4 times a day   02/02/2019 at 0600  . Multiple Vitamins-Minerals (MULTIVITAMIN ADULT PO) Take by mouth.   Past Week at Unknown time  . pantoprazole (PROTONIX) 40 MG tablet 40 mg 2 (two) times daily.    02/02/2019 at 0600  . pravastatin (PRAVACHOL) 20 MG tablet Take 20 mg by mouth daily.   02/01/2019 at Unknown time  . psyllium (METAMUCIL) 58.6 % packet Take 1 packet by mouth daily.   Past Week at Unknown time  . sildenafil (REVATIO) 20 MG tablet Take 3 to 5 tablets  two hours before intercouse on an empty stomach.  Do not take with nitrates. 50 tablet 3 Past Month at Unknown time  . tadalafil (CIALIS) 5 MG tablet Take 1 tablet (5 mg total) by mouth daily as needed for erectile dysfunction. 30 tablet 11 Past Month at Unknown time  . tamsulosin (FLOMAX) 0.4 MG CAPS capsule Take 1 capsule (0.4 mg total) by mouth daily. 90 capsule 3 02/01/2019 at Unknown time  . Testosterone 75 MG PLLT Inject into the skin.   Past Month at Unknown time  . warfarin (COUMADIN) 5 MG tablet Take by mouth. 1/2 to 1 a day   Past Week at Unknown time  . albuterol (PROAIR HFA) 108 (90 BASE) MCG/ACT inhaler Inhale into the lungs. Reported on 07/29/2015    at prn  . cyclobenzaprine (FLEXERIL) 10 MG tablet Take by mouth.    at prn  . fluticasone (FLONASE) 50 MCG/ACT nasal spray Reported on 07/29/2015    at prn  . lisinopril-hydrochlorothiazide (PRINZIDE,ZESTORETIC) 10-12.5 MG tablet Take 1 tablet by mouth daily. am        Allergies  Allergen Reactions  . Penicillins     Other reaction(s):unknown,as a child     Past Medical History:  Diagnosis Date  . Acute cystitis   . Allergic rhinitis   . BPH (benign prostatic hyperplasia)   . Bronchitis    chronic  . Carpal tunnel syndrome   .  CHF (congestive heart failure) ([place])   . Complication of anesthesia    long time to wake after fatty tumor surgery  . DDD (degenerative disc disease), lumbar    whole body, joints  . Degenerative lumbar spinal stenosis    chronic back pain, left leg and foot  . Diabetes mellitus ([place])    diet controlled  . DISH (diffuse idiopathic skeletal hyperostosis)    neck pain  . Dyspnea    with exertion, wheezing occasionally  . Erectile dysfunction   . GERD (gastroesophageal reflux disease)   . Gout   . Headache    migraines-occasionally  . History of DVT (deep vein thrombosis)    left leg  . History of migraine headaches   . History of pulmonary embolism    bilateral  . HOH (hard of hearing)     wears aids  . Hypercholesterolemia   . Hypercoagulable state ([place])   . Hypertension    controlled on meds  . Hypogonadism in male   . Infection of skin   . Iron deficiency anemia   . Lower urinary tract symptoms   . Meralgia paresthetica   . Neck pain   . Obesity   . Post-void dribbling   . Prostatitis   . Sleep apnea    with CPAP  . SNHL (sensorineural hearing loss)   . Vertigo    occasional    Review of systems:      Physical Exam    Heart and lungs: Regular rate and rhythm without rub or gallop lungs are bilaterally clear    HEENT: Normocephalic atraumatic eyes are anicteric    Other:    Pertinant exam for procedure: Soft mild tenderness to palpation left lower quadrant.  Nondistended bowel sounds positive normoactive there is no rebound or masses.    Planned proceedures: Colonoscopy and indicated procedures. I have discussed the risks benefits and complications of procedures to include not limited to bleeding, infection, perforation and the risk of sedation and the patient wishes to proceed.    Lance Sails, MD Gastroenterology 02/02/2019  10:17 AM

## 2019-02-02 NOTE — Anesthesia Post-op Follow-up Note (Signed)
Anesthesia QCDR form completed.        

## 2019-02-02 NOTE — Transfer of Care (Signed)
Immediate Anesthesia Transfer of Care Note  Patient: Lance [name]  Procedure(s) Performed: COLONOSCOPY WITH PROPOFOL (N/A )  Patient Location: PACU and Endoscopy Unit  Anesthesia Type:General  Level of Consciousness: awake, drowsy and patient cooperative  Airway & Oxygen Therapy: Patient Spontanous Breathing and Patient connected to nasal cannula oxygen  Post-op Assessment: Report given to RN and Post -op Vital signs reviewed and stable  Post vital signs: Reviewed and stable  Last Vitals:  Vitals Value Taken Time  BP 99/58 02/02/19 1054  Temp 36.6 C 02/02/19 1054  Pulse 80 02/02/19 1056  Resp 18 02/02/19 1056  SpO2 100 % 02/02/19 1056  Vitals shown include unvalidated device data.  Last Pain:  Vitals:   02/02/19 1054  TempSrc: Tympanic  PainSc:       Patients Stated Pain Goal: 0 [phone]  Complications: No apparent anesthesia complications

## 2019-02-02 NOTE — Anesthesia Postprocedure Evaluation (Signed)
Anesthesia Post Note  Patient: Lance [name]  Procedure(s) Performed: COLONOSCOPY WITH PROPOFOL (N/A )  Patient location during evaluation: Endoscopy Anesthesia Type: General Level of consciousness: awake and alert Pain management: pain level controlled Vital Signs Assessment: post-procedure vital signs reviewed and stable Respiratory status: spontaneous breathing and respiratory function stable Cardiovascular status: stable Anesthetic complications: no     Last Vitals:  Vitals:   02/02/19 0847 02/02/19 1054  BP: 128/72 (!) 99/58  Pulse:  79  Resp:  17  Temp:  36.6 C  SpO2:  98%    Last Pain:  Vitals:   02/02/19 1054  TempSrc: Tympanic  PainSc:                  [name]

## 2019-02-05 ENCOUNTER — Encounter: Payer: Self-pay | Admitting: Gastroenterology

## 2019-02-05 LAB — SURGICAL PATHOLOGY

## 2019-02-19 NOTE — Progress Notes (Signed)
This is a 68 -year-old male with testosterone deficiency and he is managed with Testopel. He presents today for Testopel insertion.  Patient is placed on the exam table in the right lateral jackknife position.  Identified upper outer quadrant of hip for insertion; prepped area with Betadine and injected 5 cc's of Lidocaine 1% to anesthetize superficially and distally along trocar tract.  Made 3 mm incision using 15 blade of scalpel; trocar with sharp ended stylet was inserted into subcutaneous tissue in line with femur. Sharp stylet was withdrawn and 6 pellets were placed into trocar well. Testopel pellets advanced into tissue using blunt ended stylet. Trocar removed and incision closed using 6 Steri-Strips. Cleansed area to remove Betadine and covered Steri-Strips with outer Band-Aid.  Careful inspection of insertion is done and patient informed of post procedure instructions.  Advised patient to apply ice to the site for 20-30 minutes every hour if needed.  Avoid hot tubes, swimming or full water immersion of the insertion site for 72 hours.  Bandage may be removed after one week.    Patient is advised to contact the office if experiencing drainage of the insertion site, excessive redness or swelling of the site, chills and/or fevers > 101.5, nausea or vomiting, dizziness or lightheadedness and excessive tenderness.  Avoid strenuous activity and heavy lifting for 72 hours.     He will return in three month for serum testosterone, PSA, HCT, hemoglobin, I PSS, SHIM and exam

## 2019-02-20 ENCOUNTER — Ambulatory Visit (INDEPENDENT_AMBULATORY_CARE_PROVIDER_SITE_OTHER): Payer: Medicare Other | Admitting: Urology

## 2019-02-20 ENCOUNTER — Encounter: Payer: Self-pay | Admitting: Urology

## 2019-02-20 ENCOUNTER — Other Ambulatory Visit: Payer: Self-pay

## 2019-02-20 VITALS — BP 119/67 | HR 80 | Ht 73.0 in | Wt 304.0 lb

## 2019-02-20 DIAGNOSIS — E349 Endocrine disorder, unspecified: Secondary | ICD-10-CM | POA: Diagnosis not present

## 2019-05-07 ENCOUNTER — Other Ambulatory Visit: Payer: Self-pay

## 2019-05-07 DIAGNOSIS — E349 Endocrine disorder, unspecified: Secondary | ICD-10-CM

## 2019-05-07 DIAGNOSIS — N401 Enlarged prostate with lower urinary tract symptoms: Secondary | ICD-10-CM

## 2019-05-08 ENCOUNTER — Other Ambulatory Visit: Payer: Medicare Other

## 2019-05-08 ENCOUNTER — Other Ambulatory Visit: Payer: Self-pay

## 2019-05-08 DIAGNOSIS — N401 Enlarged prostate with lower urinary tract symptoms: Secondary | ICD-10-CM

## 2019-05-08 DIAGNOSIS — E349 Endocrine disorder, unspecified: Secondary | ICD-10-CM

## 2019-05-09 LAB — PSA: Prostate Specific Ag, Serum: 0.5 ng/mL (ref 0.0–4.0)

## 2019-05-09 LAB — TESTOSTERONE: Testosterone: 304 ng/dL (ref 264–916)

## 2019-05-09 LAB — HEMOGLOBIN AND HEMATOCRIT, BLOOD
Hematocrit: 48 % (ref 37.5–51.0)
Hemoglobin: 15.5 g/dL (ref 13.0–17.7)

## 2019-05-14 NOTE — Progress Notes (Signed)
07/06/2018  9:41 AM   Star Age Jul 12, 1950 [id number]  Referring provider: Ezequiel Kayser, MD [facility] [place],  [place] [address]  Chief Complaint  Patient presents with  . Benign Prostatic Hypertrophy    HPI: Patient is a 69 year old diabetic male with a history of testosterone deficiency, BPH with L UTS and erectile dysfunction who presents today for a follow up.     Testosterone deficiency He is no longer having spontaneous erections at night.   He has sleep apnea and is sleeping with a CPAP machine.  He is currently managing his hypogonadism with Testopel q 3 months.    Component     Latest Ref Rng & Units 05/08/2019  Testosterone     264 - 916 ng/dL 304   Component     Latest Ref Rng & Units 05/08/2019  Hemoglobin     13.0 - 17.7 g/dL 15.5  HCT     37.5 - 51.0 % 48.0     BPH WITH LUTS  (prostate and/or bladder) IPSS score: 12/2    Previous score: 16/3   Previous PVR: 16 mL   Major complaint(s):  Intermittency and a split urinary stream x several months. Denies any dysuria, hematuria or suprapubic pain.   Currently taking: tamsulosin 0.4 mg daily.  His has had HoLEP with Dr. Erlene Quan 08/2014   Denies any recent fevers, chills, nausea or vomiting.  He does not have a family history of PCa.  IPSS    Row Name 05/15/19 0900         International Prostate Symptom Score   How often have you had the sensation of not emptying your bladder?  Less than 1 in 5     How often have you had to urinate less than every two hours?  Less than half the time     How often have you found you stopped and started again several times when you urinated?  About half the time     How often have you found it difficult to postpone urination?  Less than 1 in 5 times     How often have you had a weak urinary stream?  Less than half the time     How often have you had to strain to start urination?  Less than half the time     How many times did you  typically get up at night to urinate?  1 Time     Total IPSS Score  12       Quality of Life due to urinary symptoms   If you were to spend the rest of your life with your urinary condition just the way it is now how would you feel about that?  Mostly Satisfied        Score:  1-7 Mild 8-19 Moderate 20-35 Severe  Erectile dysfunction His SHIM score is 8, which is moderate ED.   His previous SHIM score was 9.  He has been having difficulty with erections for several years. His risk factors for ED are age, BPH, testosterone deficiency, DM, HTN, HLD, spinal injury, COPD, anticoagulation therapy and sleep apnea.  He denies any painful erections or curvatures with his erections.   He is no longer having spontaneous erections.    SHIM    Row Name 05/15/19 0924         SHIM: Over the last 6 months:   How do you rate your confidence that you  could get and keep an erection?  Low     When you had erections with sexual stimulation, how often were your erections hard enough for penetration (entering your partner)?  A Few Times (much less than half the time)     During sexual intercourse, how often were you able to maintain your erection after you had penetrated (entered) your partner?  Almost Never or Never     During sexual intercourse, how difficult was it to maintain your erection to completion of intercourse?  Very Difficult     When you attempted sexual intercourse, how often was it satisfactory for you?  Almost Never or Never       SHIM Total Score   SHIM  8       Score: 1-7 Severe ED 8-11 Moderate ED 12-16 Mild-Moderate ED 17-21 Mild ED 22-25 No ED     PMH: Past Medical History:  Diagnosis Date  . Acute cystitis   . Allergic rhinitis   . BPH (benign prostatic hyperplasia)   . Bronchitis    chronic  . Carpal tunnel syndrome   . CHF (congestive heart failure) ([employer])   . Complication of anesthesia    long time to wake after fatty tumor surgery  . DDD (degenerative disc  disease), lumbar    whole body, joints  . Degenerative lumbar spinal stenosis    chronic back pain, left leg and foot  . Diabetes mellitus ([place])    diet controlled  . DISH (diffuse idiopathic skeletal hyperostosis)    neck pain  . Dyspnea    with exertion, wheezing occasionally  . Erectile dysfunction   . GERD (gastroesophageal reflux disease)   . Gout   . Headache    migraines-occasionally  . History of DVT (deep vein thrombosis)    left leg  . History of migraine headaches   . History of pulmonary embolism    bilateral  . HOH (hard of hearing)    wears aids  . Hypercholesterolemia   . Hypercoagulable state ([place])   . Hypertension    controlled on meds  . Hypogonadism in male   . Infection of skin   . Iron deficiency anemia   . Lower urinary tract symptoms   . Meralgia paresthetica   . Neck pain   . Obesity   . Post-void dribbling   . Prostatitis   . Sleep apnea    with CPAP  . SNHL (sensorineural hearing loss)   . Vertigo    occasional    Surgical History: Past Surgical History:  Procedure Laterality Date  . Blood vessel Surgery    . CARPAL TUNNEL RELEASE Bilateral 2011  . CATARACT EXTRACTION W/PHACO Right 10/17/2017   Procedure: CATARACT EXTRACTION PHACO AND INTRAOCULAR LENS PLACEMENT ([place])  RIGHT DIABETIC;  Surgeon: Leandrew Koyanagi, MD;  Location: [facility];  Service: Ophthalmology;  Laterality: Right;  CPAP Diet controlled diabetic  . CATARACT EXTRACTION W/PHACO Left 11/23/2017   Procedure: CATARACT EXTRACTION PHACO AND INTRAOCULAR LENS PLACEMENT ([place])  LEFT DIABETIC;  Surgeon: Leandrew Koyanagi, MD;  Location: [facility];  Service: Ophthalmology;  Laterality: Left;  Diabetic  sleep apnea  . COLONOSCOPY WITH PROPOFOL N/A 07/11/2015   Procedure: COLONOSCOPY WITH PROPOFOL;  Surgeon: Lollie Sails, MD;  Location: [facility] ENDOSCOPY;  Service: Endoscopy;  Laterality: N/A;  . COLONOSCOPY WITH PROPOFOL N/A 02/02/2019   Procedure: COLONOSCOPY  WITH PROPOFOL;  Surgeon: Lollie Sails, MD;  Location[facility] ENDOSCOPY;  Service: Endoscopy;  Laterality: N/A;  .  FOOT SURGERY Bilateral   . FOOT SURGERY Left [id number]   complicated by DVT  . [place]  . HERNIA REPAIR Bilateral 1985   x2  . HOLEP-LASER ENUCLEATION OF THE PROSTATE WITH MORCELLATION  08/28/2014   Dr. Hollice Espy  . HYDROCELE EXCISION / REPAIR  1985   x2  . Hydrocelectomy     x 2  . JOINT REPLACEMENT     left knee, right shoulder  . LEG SURGERY Right 1974   fatty tumor  . PROSTATE SURGERY    . REPLACEMENT TOTAL KNEE Left 2012  . SHOULDER SURGERY Right 1968  . TONSILLECTOMY  1958  . TOTAL SHOULDER ARTHROPLASTY Right 09/18/2014   arthroplasty, glenohumeral joint; total shoulder (glenoid and prosimal humeral replacement)  . VASECTOMY  1979    Home Medications:  Allergies as of 05/15/2019      Reactions   Penicillins    Other reaction(s):unknown,as a child      Medication List       Accurate as of May 15, 2019  9:41 AM. If you have any questions, ask your nurse or doctor.        cetirizine 10 MG chewable tablet Commonly known as: ZYRTEC Chew 10 mg by mouth daily.   cyclobenzaprine 10 MG tablet Commonly known as: FLEXERIL Take by mouth.   Fluad 0.5 ML Susy Generic drug: Influenza Vac A&B Surf Ant Adj ADM 0.5ML IM UTD   fluticasone 50 MCG/ACT nasal spray Commonly known as: FLONASE Reported on 07/29/2015   HYDROcodone-acetaminophen 5-325 MG tablet Commonly known as: NORCO/VICODIN 5-325 tablets. 1/2 to 1 tablet 4 times a day   lisinopril-hydrochlorothiazide 10-12.5 MG tablet Commonly known as: ZESTORETIC Take 1 tablet by mouth daily. am   MULTIVITAMIN ADULT PO Take by mouth.   Neurontin 300 MG capsule Generic drug: gabapentin Take 300 mg by mouth 4 (four) times daily.   pantoprazole 40 MG tablet Commonly known as: PROTONIX 40 mg 2 (two) times daily.   pravastatin 20 MG tablet Commonly known as: PRAVACHOL Take 20 mg by  mouth daily.   ProAir HFA 108 (90 Base) MCG/ACT inhaler Generic drug: albuterol Inhale into the lungs. Reported on 07/29/2015   psyllium 58.6 % packet Commonly known as: METAMUCIL Take 1 packet by mouth daily.   sildenafil 20 MG tablet Commonly known as: REVATIO Take 3 to 5 tablets two hours before intercouse on an empty stomach.  Do not take with nitrates.   tadalafil 5 MG tablet Commonly known as: CIALIS Take 1 tablet (5 mg total) by mouth daily as needed for erectile dysfunction.   tamsulosin 0.4 MG Caps capsule Commonly known as: FLOMAX Take 1 capsule (0.4 mg total) by mouth daily.   Testosterone 75 MG Pllt Inject into the skin.   warfarin 5 MG tablet Commonly known as: COUMADIN Take by mouth. 1/2 to 1 a day       Allergies:  Allergies  Allergen Reactions  . Penicillins     Other reaction(s):unknown,as a child    Family History: Family History  Problem Relation Age of Onset  . Thrombosis Other   . Skin cancer Father   . Kidney disease Neg Hx   . Prostate cancer Neg Hx   . Kidney cancer Neg Hx   . Bladder Cancer Neg Hx     Social History:  reports that he has never smoked. He has never used smokeless tobacco. He reports that he does not drink alcohol or use drugs.  ROS: UROLOGY  Frequent Urination?: No Hard to postpone urination?: No Burning/pain with urination?: No Get up at night to urinate?: No Leakage of urine?: No Urine stream starts and stops?: Yes Trouble starting stream?: No Do you have to strain to urinate?: No Blood in urine?: No Urinary tract infection?: No Sexually transmitted disease?: No Injury to kidneys or bladder?: No Painful intercourse?: No Weak stream?: No Erection problems?: Yes Penile pain?: No  Gastrointestinal Nausea?: No Vomiting?: No Indigestion/heartburn?: No Diarrhea?: Yes Constipation?: Yes  Constitutional Fever: No Night sweats?: No Weight loss?: No Fatigue?: No  Skin Skin rash/lesions?: No Itching?:  No  Eyes Blurred vision?: No Double vision?: No  Ears/Nose/Throat Sore throat?: No Sinus problems?: No  Hematologic/Lymphatic Swollen glands?: No Easy bruising?: No  Cardiovascular Leg swelling?: Yes Chest pain?: No  Respiratory Cough?: No Shortness of breath?: No  Endocrine Excessive thirst?: No  Musculoskeletal Back pain?: Yes Joint pain?: Yes  Neurological Headaches?: No Dizziness?: No  Psychologic Depression?: No Anxiety?: No  Physical Exam: BP 114/66   Pulse 81   Ht 6\' 1"  (1.854 m)   Wt (!) 305 lb (138.3 kg)   BMI 40.24 kg/m   Constitutional:  Well nourished. Alert and oriented, No acute distress. HEENT: [place] AT, mask in place.  Trachea midline, no masses. Cardiovascular: No clubbing, cyanosis, or edema. Respiratory: Normal respiratory effort, no increased work of breathing. GI: Abdomen is soft, non tender, non distended, no abdominal masses. Liver and spleen not palpable.  Right inguinal hernias appreciated.  Stool sample for occult testing is not indicated.   GU: No CVA tenderness.  No bladder fullness or masses.  Patient with circumcised phallus.   Urethral meatus is patent.  No penile discharge. No penile lesions or rashes. Scrotum without lesions, cysts, rashes and/or edema.  Testicles are located scrotally bilaterally. No masses are appreciated in the testicles. Left and right epididymis are normal. Rectal: Patient with  normal sphincter tone. Anus and perineum without scarring or rashes. No rectal masses are appreciated. Prostate is approximately 50 grams, could not palpate the apex and midportion of the gland, no nodules are appreciated. Seminal vesicles could not be palpated  Skin: No rashes, bruises or suspicious lesions. Lymph: No inguinal adenopathy. Neurologic: Grossly intact, no focal deficits, moving all 4 extremities. Psychiatric: Normal mood and affect.   Laboratory Data:  PSA History   Component     Latest Ref Rng & Units 04/10/2015  12/26/2015 06/11/2016 01/04/2017  Prostate Specific Ag, Serum     0.0 - 4.0 ng/mL 0.5 0.4 0.4 0.5   Component     Latest Ref Rng & Units 08/08/2017 12/27/2017 07/03/2018 05/08/2019  Prostate Specific Ag, Serum     0.0 - 4.0 ng/mL 0.5 0.4 0.3 0.5      Lab Results  Component Value Date   TESTOSTERONE 304 05/08/2019    Results for orders placed or performed in visit on 05/08/19  Hemoglobin and Hematocrit, Blood  Result Value Ref Range   Hemoglobin 15.5 13.0 - 17.7 g/dL   Hematocrit 48.0 37.5 - 51.0 %  Testosterone  Result Value Ref Range   Testosterone 304 264 - 916 ng/dL  PSA  Result Value Ref Range   Prostate Specific Ag, Serum 0.5 0.0 - 4.0 ng/mL  I have reviewed the labs.  Assessment & Plan:    1. Testosterone deficiency  Most recent testosterone level is 304 ng/dL on 04/2019 (goal 450-600 ng/dL) RTC for Testopel    2. BPH with LUTS IPSS score is 12/2, it  is improved Continue conservative management, avoiding bladder irritants and timed voiding's Most bothersome symptoms of intermittency and splitting of the stream - explained that this may mean that there is a stricture and recommended a cystoscopy for further evaluation but he has four upcoming surgeries and would like to get them under his belt first (right shoulder, hernia, right knee and left shoulder) Continue tamsulosin 0.4 mg daily; refills given RTC in 6 months for IPSS, PSA, PVR and exam, as testosterone therapy can cause prostate enlargement and worsen LUTS  3. Erectile dysfunction:    SHIM score is 8, it is worse  Discontinue Sildenafil - caused facial flushing Discontinue Cialis 5 mg - made him sleepy Not interested in sexually activity at this time as he has several upcoming surgeries  RTC in 6 months for SHIM score and exam, as testosterone therapy can affect erections   Return for schedule Testopel .  These notes generated with voice recognition software. I apologize for typographical errors.  Zara Council, PA-C  [facility] [address] [employer] [place], [place] [address] [phone]

## 2019-05-15 ENCOUNTER — Ambulatory Visit (INDEPENDENT_AMBULATORY_CARE_PROVIDER_SITE_OTHER): Payer: Medicare Other | Admitting: Urology

## 2019-05-15 ENCOUNTER — Encounter: Payer: Self-pay | Admitting: Urology

## 2019-05-15 ENCOUNTER — Other Ambulatory Visit: Payer: Self-pay

## 2019-05-15 VITALS — BP 114/66 | HR 81 | Ht 73.0 in | Wt 305.0 lb

## 2019-05-15 DIAGNOSIS — N138 Other obstructive and reflux uropathy: Secondary | ICD-10-CM

## 2019-05-15 DIAGNOSIS — N529 Male erectile dysfunction, unspecified: Secondary | ICD-10-CM | POA: Diagnosis not present

## 2019-05-15 DIAGNOSIS — E349 Endocrine disorder, unspecified: Secondary | ICD-10-CM | POA: Diagnosis not present

## 2019-05-15 DIAGNOSIS — N401 Enlarged prostate with lower urinary tract symptoms: Secondary | ICD-10-CM

## 2019-05-15 MED ORDER — TAMSULOSIN HCL 0.4 MG PO CAPS: 0.4000 mg | ORAL_CAPSULE | Freq: Every day | ORAL | 3 refills | Status: DC

## 2019-05-16 ENCOUNTER — Other Ambulatory Visit: Payer: Medicare Other

## 2019-05-16 DIAGNOSIS — I951 Orthostatic hypotension: Secondary | ICD-10-CM | POA: Insufficient documentation

## 2019-05-16 DIAGNOSIS — T849XXA Unspecified complication of internal orthopedic prosthetic device, implant and graft, initial encounter: Secondary | ICD-10-CM | POA: Insufficient documentation

## 2019-05-23 ENCOUNTER — Ambulatory Visit: Payer: Medicare Other | Admitting: Urology

## 2019-06-05 ENCOUNTER — Ambulatory Visit (INDEPENDENT_AMBULATORY_CARE_PROVIDER_SITE_OTHER): Payer: Medicare Other | Admitting: Urology

## 2019-06-05 ENCOUNTER — Other Ambulatory Visit: Payer: Self-pay

## 2019-06-05 ENCOUNTER — Encounter: Payer: Self-pay | Admitting: Urology

## 2019-06-05 VITALS — BP 119/69 | HR 79 | Ht 72.0 in | Wt 300.0 lb

## 2019-06-05 DIAGNOSIS — E230 Hypopituitarism: Secondary | ICD-10-CM | POA: Diagnosis not present

## 2019-06-05 DIAGNOSIS — E349 Endocrine disorder, unspecified: Secondary | ICD-10-CM

## 2019-06-05 MED ORDER — TESTOSTERONE 75 MG IL PLLT
75.0000 mg | PELLET | Freq: Once | Status: AC
  Administered 2019-06-05: 75 mg

## 2019-06-05 NOTE — Progress Notes (Signed)
This is a 69 -year-old male with testosterone deficiency and he is managed with Testopel. He presents today for Testopel insertion.  Patient is placed on the exam table in the right lateral jackknife position.  Identified upper outer quadrant of hip for insertion; prepped area with Betadine and injected 10 cc's of Lidocaine 1% with Epinephrine to anesthetize superficially and distally along trocar tract.  Made 3 mm incision using 15 blade of scalpel; trocar with sharp ended stylet was inserted into subcutaneous tissue in line with femur. Sharp stylet was withdrawn and 6 pellets were placed into trocar well. Testopel pellets advanced into tissue using blunt ended stylet. Trocar removed and incision closed using 6 Steri-Strips. Cleansed area to remove Betadine and covered Steri-Strips with outer Band-Aid.  Careful inspection of insertion is done and patient informed of post procedure instructions.  Advised patient to apply ice to the site for 20-30 minutes every hour if needed.  Avoid hot tubes, swimming or full water immersion of the insertion site for 72 hours.  Bandage may be removed after one week.    Patient is advised to contact the office if experiencing drainage of the insertion site, excessive redness or swelling of the site, chills and/or fevers > 101.5, nausea or vomiting, dizziness or lightheadedness and excessive tenderness.  Avoid strenuous activity and heavy lifting for 72 hours.    He will return in one month for serum testosterone.

## 2019-06-05 NOTE — Patient Instructions (Signed)
Apply ice to the site for 20-30 minutes every hour if needed.  Avoid hot tubes, swimming or full water immersion of the insertion site for 72 hours.  Bandage may be removed after one week.    Contact the office if experiencing drainage of the insertion site, excessive redness or swelling of the site, chills and/or fevers > 101.5, nausea or vomiting, dizziness or lightheadedness and excessive tenderness.  Avoid strenuous activity and heavy lifting for 72 hours.     

## 2019-07-05 ENCOUNTER — Other Ambulatory Visit: Payer: Self-pay

## 2019-07-05 ENCOUNTER — Other Ambulatory Visit: Payer: Medicare Other

## 2019-07-05 DIAGNOSIS — E349 Endocrine disorder, unspecified: Secondary | ICD-10-CM

## 2019-07-06 ENCOUNTER — Other Ambulatory Visit: Payer: Self-pay

## 2019-07-06 ENCOUNTER — Other Ambulatory Visit: Payer: Medicare Other

## 2019-07-06 DIAGNOSIS — E349 Endocrine disorder, unspecified: Secondary | ICD-10-CM

## 2019-07-07 LAB — TESTOSTERONE: Testosterone: 604 ng/dL (ref 264–916)

## 2019-07-10 ENCOUNTER — Other Ambulatory Visit: Payer: Self-pay | Admitting: Family Medicine

## 2019-07-10 ENCOUNTER — Telehealth: Payer: Self-pay | Admitting: Family Medicine

## 2019-07-10 DIAGNOSIS — N138 Other obstructive and reflux uropathy: Secondary | ICD-10-CM

## 2019-07-10 DIAGNOSIS — E349 Endocrine disorder, unspecified: Secondary | ICD-10-CM

## 2019-07-10 NOTE — Telephone Encounter (Signed)
Patient notified and appointment made

## 2019-07-10 NOTE — Telephone Encounter (Signed)
-----   Message from Nori Riis, PA-C sent at 07/09/2019  7:49 AM EST ----- Please let Lance [name] know that his testosterone level is within therapeutic range.  We need to see him in two months for PSA, testosterone level, HCT, Hgb, I PSS, SHIM and exam.  Blood work prior to appointment.

## 2019-09-14 ENCOUNTER — Other Ambulatory Visit: Payer: Self-pay

## 2019-09-14 ENCOUNTER — Other Ambulatory Visit: Payer: Medicare Other

## 2019-09-14 DIAGNOSIS — E349 Endocrine disorder, unspecified: Secondary | ICD-10-CM

## 2019-09-14 DIAGNOSIS — N138 Other obstructive and reflux uropathy: Secondary | ICD-10-CM

## 2019-09-15 LAB — HEMATOCRIT: Hematocrit: 46 % (ref 37.5–51.0)

## 2019-09-15 LAB — HEMOGLOBIN: Hemoglobin: 14.2 g/dL (ref 13.0–17.7)

## 2019-09-15 LAB — TESTOSTERONE: Testosterone: 233 ng/dL — ABNORMAL LOW (ref 264–916)

## 2019-09-15 LAB — PSA: Prostate Specific Ag, Serum: 0.5 ng/mL (ref 0.0–4.0)

## 2019-09-18 ENCOUNTER — Encounter: Payer: Self-pay | Admitting: Urology

## 2019-09-18 ENCOUNTER — Other Ambulatory Visit: Payer: Self-pay

## 2019-09-18 ENCOUNTER — Ambulatory Visit (INDEPENDENT_AMBULATORY_CARE_PROVIDER_SITE_OTHER): Payer: Medicare Other | Admitting: Urology

## 2019-09-18 VITALS — BP 130/63 | HR 75 | Ht 72.0 in | Wt 310.0 lb

## 2019-09-18 DIAGNOSIS — N138 Other obstructive and reflux uropathy: Secondary | ICD-10-CM | POA: Diagnosis not present

## 2019-09-18 DIAGNOSIS — E349 Endocrine disorder, unspecified: Secondary | ICD-10-CM | POA: Diagnosis not present

## 2019-09-18 DIAGNOSIS — N401 Enlarged prostate with lower urinary tract symptoms: Secondary | ICD-10-CM | POA: Diagnosis not present

## 2019-09-18 DIAGNOSIS — N529 Male erectile dysfunction, unspecified: Secondary | ICD-10-CM

## 2019-09-18 NOTE — Progress Notes (Signed)
07/06/2018  10:38 AM   Lance [name] 70-Mar-1952 [id number]  Referring provider: Ezequiel Kayser, MD [facility] [place],  [place] [address]  Chief Complaint  Patient presents with  . Hypogonadism    HPI: Patient is a 69 year old diabetic male with a history of testosterone deficiency, BPH with L UTS and erectile dysfunction who presents today for a follow up.     Testosterone deficiency He is no longer having spontaneous erections at night.   He has sleep apnea and is sleeping with a CPAP machine.  He is currently managing his testosterone deficiency with Testopel q 3 months.    Component     Latest Ref Rng & Units 09/14/2019  Testosterone     264 - 916 ng/dL 233 (L)   Component     Latest Ref Rng & Units 09/14/2019  HCT     37.5 - 51.0 % 46.0     Component     Latest Ref Rng & Units 09/14/2019  Hemoglobin     13.0 - 17.7 g/dL 14.2    BPH WITH LUTS  (prostate and/or bladder) IPSS score: 14/2   Previous score: 12/2   Previous PVR: 16 mL   Major complaint(s):  Frequency.  Patient denies any modifying or aggravating factors.  Patient denies any gross hematuria, dysuria or suprapubic/flank pain.  Patient denies any fevers, chills, nausea or vomiting.   Currently taking: tamsulosin 0.4 mg daily.  His has had HoLEP with Dr. Erlene Quan 08/2014   He does not have a family history of PCa.  IPSS    Row Name 09/18/19 0900         International Prostate Symptom Score   How often have you had the sensation of not emptying your bladder?  Less than 1 in 5     How often have you had to urinate less than every two hours?  About half the time     How often have you found you stopped and started again several times when you urinated?  Almost always     How often have you found it difficult to postpone urination?  Less than 1 in 5 times     How often have you had a weak urinary stream?  Less than half the time     How often have you had to strain to start  urination?  Less than 1 in 5 times     How many times did you typically get up at night to urinate?  1 Time     Total IPSS Score  14       Quality of Life due to urinary symptoms   If you were to spend the rest of your life with your urinary condition just the way it is now how would you feel about that?  Mostly Satisfied        Score:  1-7 Mild 8-19 Moderate 20-35 Severe  Erectile dysfunction His SHIM score is 5, which is severe ED.   His previous SHIM score was 8.  He has been having difficulty with erections for several years. His risk factors for ED are [name], BPH, testosterone deficiency, DM, HTN, HLD, spinal injury, COPD, anticoagulation therapy and sleep apnea.  He denies any painful erections or curvatures with his erections.   He is no longer having spontaneous erections.    SHIM    Row Name 09/18/19 0945         SHIM: Over  the last 6 months:   How do you rate your confidence that you could get and keep an erection?  Very Low     When you had erections with sexual stimulation, how often were your erections hard enough for penetration (entering your partner)?  Almost Never or Never     During sexual intercourse, how often were you able to maintain your erection after you had penetrated (entered) your partner?  Almost Never or Never     During sexual intercourse, how difficult was it to maintain your erection to completion of intercourse?  Extremely Difficult     When you attempted sexual intercourse, how often was it satisfactory for you?  Almost Never or Never       SHIM Total Score   SHIM  5       Score: 1-7 Severe ED 8-11 Moderate ED 12-16 Mild-Moderate ED 17-21 Mild ED 22-25 No ED  PMH: Past Medical History:  Diagnosis Date  . Acute cystitis   . Allergic rhinitis   . BPH (benign prostatic hyperplasia)   . Bronchitis    chronic  . Carpal tunnel syndrome   . CHF (congestive heart failure) ([place])   . Complication of anesthesia    long time to wake after fatty  tumor surgery  . DDD (degenerative disc disease), lumbar    whole body, joints  . Degenerative lumbar spinal stenosis    chronic back pain, left leg and foot  . Diabetes mellitus ([place])    diet controlled  . DISH (diffuse idiopathic skeletal hyperostosis)    neck pain  . Dyspnea    with exertion, wheezing occasionally  . Erectile dysfunction   . GERD (gastroesophageal reflux disease)   . Gout   . Headache    migraines-occasionally  . History of DVT (deep vein thrombosis)    left leg  . History of migraine headaches   . History of pulmonary embolism    bilateral  . HOH (hard of hearing)    wears aids  . Hypercholesterolemia   . Hypercoagulable state ([place])   . Hypertension    controlled on meds  . Hypogonadism in male   . Infection of skin   . Iron deficiency anemia   . Lower urinary tract symptoms   . Meralgia paresthetica   . Neck pain   . Obesity   . Post-void dribbling   . Prostatitis   . Sleep apnea    with CPAP  . SNHL (sensorineural hearing loss)   . Vertigo    occasional    Surgical History: Past Surgical History:  Procedure Laterality Date  . Blood vessel Surgery    . CARPAL TUNNEL RELEASE Bilateral 2011  . CATARACT EXTRACTION W/PHACO Right 10/17/2017   Procedure: CATARACT EXTRACTION PHACO AND INTRAOCULAR LENS PLACEMENT ([place])  RIGHT DIABETIC;  Surgeon: Leandrew Koyanagi, MD;  Location: [facility];  Service: Ophthalmology;  Laterality: Right;  CPAP Diet controlled diabetic  . CATARACT EXTRACTION W/PHACO Left 11/23/2017   Procedure: CATARACT EXTRACTION PHACO AND INTRAOCULAR LENS PLACEMENT ([place])  LEFT DIABETIC;  Surgeon: Leandrew Koyanagi, MD;  Location: [facility];  Service: Ophthalmology;  Laterality: Left;  Diabetic  sleep apnea  . COLONOSCOPY WITH PROPOFOL N/A 07/11/2015   Procedure: COLONOSCOPY WITH PROPOFOL;  Surgeon: Lollie Sails, MD;  [facility] ENDOSCOPY;  Service: Endoscopy;  Laterality: N/A;  . COLONOSCOPY WITH  PROPOFOL N/A 02/02/2019   Procedure: COLONOSCOPY WITH PROPOFOL;  Surgeon: Lollie Sails, MD;  Location: [facility] ENDOSCOPY;  Service: Endoscopy;  Laterality: N/A;  . FOOT SURGERY Bilateral   . FOOT SURGERY Left [id number]   complicated by DVT  . [place]  . HERNIA REPAIR Bilateral 1985   x2  . HOLEP-LASER ENUCLEATION OF THE PROSTATE WITH MORCELLATION  08/28/2014   Dr. Hollice Espy  . HYDROCELE EXCISION / REPAIR  1985   x2  . Hydrocelectomy     x 2  . JOINT REPLACEMENT     left knee, right shoulder  . LEG SURGERY Right 1974   fatty tumor  . PROSTATE SURGERY    . REPLACEMENT TOTAL KNEE Left 2012  . SHOULDER SURGERY Right 1968  . TONSILLECTOMY  1958  . TOTAL SHOULDER ARTHROPLASTY Right 09/18/2014   arthroplasty, glenohumeral joint; total shoulder (glenoid and prosimal humeral replacement)  . VASECTOMY  1979    Home Medications:  Allergies as of 09/18/2019      Reactions   Penicillins    Other reaction(s):unknown,as a child      Medication List       Accurate as of Sep 18, 2019 10:38 AM. If you have any questions, ask your nurse or doctor.        cetirizine 10 MG chewable tablet Commonly known as: ZYRTEC Chew 10 mg by mouth daily.   cyclobenzaprine 10 MG tablet Commonly known as: FLEXERIL Take by mouth.   Fluad 0.5 ML [place] Generic drug: Influenza Vac A&B Surf Ant Adj ADM 0.5ML IM UTD   fluticasone 50 MCG/ACT nasal spray Commonly known as: FLONASE Reported on 07/29/2015   HYDROcodone-acetaminophen 5-325 MG tablet Commonly known as: NORCO/VICODIN 5-325 tablets. 1/2 to 1 tablet 4 times a day   lisinopril-hydrochlorothiazide 10-12.5 MG tablet Commonly known as: ZESTORETIC Take 1 tablet by mouth daily. am   MULTIVITAMIN ADULT PO Take by mouth.   Neurontin 300 MG capsule Generic drug: gabapentin Take 300 mg by mouth 4 (four) times daily.   oxyCODONE 5 MG immediate release tablet Commonly known as: Oxy IR/ROXICODONE Take 1-3 tabs PO every 4-6  hours as needed for post-op pain   pantoprazole 40 MG tablet Commonly known as: PROTONIX 40 mg 2 (two) times daily.   pravastatin 20 MG tablet Commonly known as: PRAVACHOL Take 20 mg by mouth daily.   ProAir HFA 108 (90 Base) MCG/ACT inhaler Generic drug: albuterol Inhale into the lungs. Reported on 07/29/2015   psyllium 58.6 % packet Commonly known as: METAMUCIL Take 1 packet by mouth daily.   sildenafil 20 MG tablet Commonly known as: REVATIO Take 3 to 5 tablets two hours before intercouse on an empty stomach.  Do not take with nitrates.   tadalafil 5 MG tablet Commonly known as: CIALIS Take 1 tablet (5 mg total) by mouth daily as needed for erectile dysfunction.   tamsulosin 0.4 MG Caps capsule Commonly known as: FLOMAX Take 1 capsule (0.4 mg total) by mouth daily.   Testosterone 75 MG Pllt Inject into the skin.   warfarin 5 MG tablet Commonly known as: COUMADIN Take by mouth. 1/2 to 1 a day       Allergies:  Allergies  Allergen Reactions  . Penicillins     Other reaction(s):unknown,as a child    Family History: Family History  Problem Relation [name] of Onset  . Thrombosis Other   . Skin cancer Father   . Kidney disease Neg Hx   . Prostate cancer Neg Hx   . Kidney cancer Neg Hx   . Bladder Cancer Neg Hx  Social History:  reports that he has never smoked. He has never used smokeless tobacco. He reports that he does not drink alcohol or use drugs.  ROS: For pertinent review of systems please refer to history of present illness  Physical Exam: BP 130/63   Pulse 75   Ht 6' (1.829 m)   Wt (!) 310 lb (140.6 kg)   BMI 42.04 kg/m   Constitutional:  Well nourished. Alert and oriented, No acute distress. HEENT: [place] AT, mask in place  Trachea midline Cardiovascular: No clubbing, cyanosis, or edema. Respiratory: Normal respiratory effort, no increased work of breathing. GI: Abdomen is soft, non tender, non distended, no abdominal masses.  Right inguinal  hernia.  GU: No CVA tenderness.  No bladder fullness or masses.  Patient with circumcised phallus.  Urethral meatus is patent.  No penile discharge. No penile lesions or rashes. Scrotum without lesions, cysts, rashes and/or edema.  Testicles are located scrotally bilaterally. No masses are appreciated in the testicles. Left and right epididymis are normal. Rectal: Patient with  normal sphincter tone. Anus and perineum without scarring or rashes. No rectal masses are appreciated. Prostate is approximately 50 grams, no nodules are appreciated. Seminal vesicles could not be palpated Skin: No rashes, bruises or suspicious lesions. Lymph: No inguinal adenopathy. Neurologic: Grossly intact, no focal deficits, moving all 4 extremities. Psychiatric: Normal mood and affect.   Laboratory Data: PSA History   Component     Latest Ref Rng & Units 04/10/2015 12/26/2015 06/11/2016 01/04/2017  Prostate Specific Ag, Serum     0.0 - 4.0 ng/mL 0.5 0.4 0.4 0.5   Component     Latest Ref Rng & Units 08/08/2017 12/27/2017 07/03/2018 05/08/2019  Prostate Specific Ag, Serum     0.0 - 4.0 ng/mL 0.5 0.4 0.3 0.5   Component     Latest Ref Rng & Units 09/14/2019  Prostate Specific Ag, Serum     0.0 - 4.0 ng/mL 0.5      Lab Results  Component Value Date   TESTOSTERONE 233 (L) 09/14/2019    Results for orders placed or performed in visit on 09/14/19  Hemoglobin  Result Value Ref Range   Hemoglobin 14.2 13.0 - 17.7 g/dL  Hematocrit  Result Value Ref Range   Hematocrit 46.0 37.5 - 51.0 %  Testosterone  Result Value Ref Range   Testosterone 233 (L) 264 - 916 ng/dL  PSA  Result Value Ref Range   Prostate Specific Ag, Serum 0.5 0.0 - 4.0 ng/mL  I have reviewed the labs.  Assessment & Plan:    1. Testosterone deficiency  Most recent testosterone level is 233 ng/dL on 09/14/2019 (goal 450-600 ng/dL) RTC for Testopel    2. BPH with LUTS IPSS score is 14/2, it is worsened  Continue conservative management,  avoiding bladder irritants and timed voiding's Continue tamsulosin 0.4 mg daily RTC in 6 months for IPSS, PSA, PVR and exam, as testosterone therapy can cause prostate enlargement and worsen LUTS  3. Erectile dysfunction:    SHIM score is 5, it is worse  Discontinue Sildenafil - caused facial flushing Discontinue Cialis 5 mg - made him sleepy Not interested in sexually activity at this time as he has several upcoming surgeries  RTC in 6 months for SHIM score and exam, as testosterone therapy can affect erections   Return for schedule Testopel insertion .  These notes generated with voice recognition software. I apologize for typographical errors.  Lance [name]  [place]  [address] [place], [place] [address] [phone]

## 2019-09-26 ENCOUNTER — Other Ambulatory Visit: Payer: Self-pay

## 2019-09-26 ENCOUNTER — Encounter: Payer: Self-pay | Admitting: Urology

## 2019-09-26 ENCOUNTER — Ambulatory Visit (INDEPENDENT_AMBULATORY_CARE_PROVIDER_SITE_OTHER): Payer: Medicare Other | Admitting: Urology

## 2019-09-26 VITALS — BP 121/75 | HR 70 | Ht 73.0 in | Wt 310.0 lb

## 2019-09-26 DIAGNOSIS — E349 Endocrine disorder, unspecified: Secondary | ICD-10-CM | POA: Diagnosis not present

## 2019-09-26 DIAGNOSIS — E230 Hypopituitarism: Secondary | ICD-10-CM | POA: Diagnosis not present

## 2019-09-26 MED ORDER — TESTOSTERONE 75 MG IL PLLT
75.0000 mg | PELLET | Freq: Once | Status: AC
Start: 2019-09-26 — End: 2019-09-26
  Administered 2019-09-26: 75 mg

## 2019-09-26 NOTE — Progress Notes (Signed)
This is a 69 -year-old male with hypogonadism and he is managed with Testopel. He presents today for Testopel insertion.  Patient is placed on the exam table in the Left lateral jackknife position.  Identified upper outer quadrant of hip for insertion; prepped area with Betadine and injected 8 cc's of Lidocaine 1% to anesthetize superficially and distally along trocar tract.  Made 3 mm incision using 11 blade of scalpel; trocar with sharp ended stylet was inserted into subcutaneous tissue in line with femur. Sharp stylet was withdrawn and 6 pellets were placed into trocar well. Testopel pellets advanced into tissue using blunt ended stylet. Trocar removed and incision closed using 6 Steri-Strips. Cleansed area to remove Betadine and covered Steri-Strips with outer Band-Aid.  Careful inspection of insertion is done and patient informed of post procedure instructions.  Advised patient to apply ice to the site for 20-30 minutes every hour if needed.  Avoid hot tubes, swimming or full water immersion of the insertion site for 72 hours.  Bandage may be removed after one week.    Patient is advised to contact the office if experiencing drainage of the insertion site, excessive redness or swelling of the site, chills and/or fevers > 101.5, nausea or vomiting, dizziness or lightheadedness and excessive tenderness.  Avoid strenuous activity and heavy lifting for 72 hours.    He will return in one month for serum testosterone.

## 2019-10-02 DIAGNOSIS — N400 Enlarged prostate without lower urinary tract symptoms: Secondary | ICD-10-CM | POA: Insufficient documentation

## 2019-10-17 ENCOUNTER — Telehealth: Payer: Self-pay | Admitting: Urology

## 2019-10-17 NOTE — Telephone Encounter (Signed)
Patient called the office this morning.  He received testopel on 5/19.  He states that the incision never really healed all the way and was swollen.  He believes that one of the pellets came out.  He would like to come in for [facility] to take a look at the site and advise him on what to do next.  I added him to the schedule for Friday, 6/11 at 11am per his request.

## 2019-10-19 ENCOUNTER — Ambulatory Visit (INDEPENDENT_AMBULATORY_CARE_PROVIDER_SITE_OTHER): Payer: Medicare Other | Admitting: Urology

## 2019-10-19 ENCOUNTER — Other Ambulatory Visit: Payer: Self-pay

## 2019-10-19 ENCOUNTER — Encounter: Payer: Self-pay | Admitting: Urology

## 2019-10-19 VITALS — BP 131/72 | HR 88 | Ht 73.0 in | Wt 310.0 lb

## 2019-10-19 DIAGNOSIS — E349 Endocrine disorder, unspecified: Secondary | ICD-10-CM

## 2019-10-20 LAB — TESTOSTERONE: Testosterone: 265 ng/dL (ref 264–916)

## 2019-10-21 NOTE — Progress Notes (Signed)
10/19/2019 4:28 PM   Lance [name] Oct 16, 1950 [id number]  Referring provider: Ezequiel Kayser, MD [place] [facility] [place],  Lance [name] [id number]  Chief Complaint  Patient presents with  . Follow-up    HPI: Mr. Lance [name] is a 69 year old male with testosterone deficiency, BPH with LUTS and erectile dysfunction who presents today after experiencing pellet extrusion.  He underwent Testopel insertion on Sep 26, 2019.  He states a three weeks after this, he felt what he described as a "pimple" at the insertion site.  His wife squeezed the area and a pellet came out.  He has had two other pellets expelled from the tract.    He has noted some bloody drainage, but he has not had any purulent drainage from the site.    PMH: Past Medical History:  Diagnosis Date  . Acute cystitis   . Allergic rhinitis   . BPH (benign prostatic hyperplasia)   . Bronchitis    chronic  . Carpal tunnel syndrome   . CHF (congestive heart failure) ([place])   . Complication of anesthesia    long time to wake after fatty tumor surgery  . DDD (degenerative disc disease), lumbar    whole body, joints  . Degenerative lumbar spinal stenosis    chronic back pain, left leg and foot  . Diabetes mellitus ([place])    diet controlled  . DISH (diffuse idiopathic skeletal hyperostosis)    neck pain  . Dyspnea    with exertion, wheezing occasionally  . Erectile dysfunction   . GERD (gastroesophageal reflux disease)   . Gout   . Headache    migraines-occasionally  . History of DVT (deep vein thrombosis)    left leg  . History of migraine headaches   . History of pulmonary embolism    bilateral  . HOH (hard of hearing)    wears aids  . Hypercholesterolemia   . Hypercoagulable state ([place])   . Hypertension    controlled on meds  . Hypogonadism in male   . Infection of skin   . Iron deficiency anemia   . Lower urinary tract symptoms   . Meralgia paresthetica   . Neck pain   . Obesity    . Post-void dribbling   . Prostatitis   . Sleep apnea    with CPAP  . SNHL (sensorineural hearing loss)   . Vertigo    occasional    Surgical History: Past Surgical History:  Procedure Laterality Date  . Blood vessel Surgery    . CARPAL TUNNEL RELEASE Bilateral 2011  . CATARACT EXTRACTION W/PHACO Right 10/17/2017   Procedure: CATARACT EXTRACTION PHACO AND INTRAOCULAR LENS PLACEMENT ([place])  RIGHT DIABETIC;  Surgeon: Lance Koyanagi, MD;  Location: [facility];  Service: Ophthalmology;  Laterality: Right;  CPAP Diet controlled diabetic  . CATARACT EXTRACTION W/PHACO Left 11/23/2017   Procedure: CATARACT EXTRACTION PHACO AND INTRAOCULAR LENS PLACEMENT ([place])  LEFT DIABETIC;  Surgeon: Lance Koyanagi, MD;  Location: [facility];  Service: Ophthalmology;  Laterality: Left;  Diabetic  sleep apnea  . COLONOSCOPY WITH PROPOFOL N/A 07/11/2015   Procedure: COLONOSCOPY WITH PROPOFOL;  Surgeon: Lance Sails, MD;  [facility] END[facility]COPY;  Service: Endoscopy;  Laterality: N/A;  . COLONOSCOPY WITH PROPOFOL N/A 02/02/2019   Procedure: COLONOSCOPY WITH PROPOFOL;  Surgeon: Lance Sails, MD;  Location: [facility] ENDOSCOPY;  Service: Endoscopy;  Laterality: N/A;  . FOOT SURGERY Bilateral   . FOOT SURGERY Left 2003  complicated by DVT  . [place]  . HERNIA REPAIR Bilateral 1985   x2  . HOLEP-LASER ENUCLEATION OF THE PROSTATE WITH MORCELLATION  08/28/2014   Dr. Hollice [name]  . HYDROCELE EXCISION / REPAIR  1985   x2  . Hydrocelectomy     x 2  . JOINT REPLACEMENT     left knee, right shoulder  . LEG SURGERY Right 1974   fatty tumor  . PROSTATE SURGERY    . REPLACEMENT TOTAL KNEE Left 2012  . SHOULDER SURGERY Right 1968  . TONSILLECTOMY  1958  . TOTAL SHOULDER ARTHROPLASTY Right 09/18/2014   arthroplasty, glenohumeral joint; total shoulder (glenoid and prosimal humeral replacement)  . VASECTOMY  1979    Home Medications:  Allergies as of  10/19/2019      Reactions   Penicillins    Other reaction(s):unknown,as a child      Medication List       Accurate as of October 19, 2019 11:59 PM. If you have any questions, ask your nurse or doctor.        amoxicillin 500 MG capsule Commonly known as: AMOXIL   cetirizine 10 MG chewable tablet Commonly known as: ZYRTEC Chew 10 mg by mouth daily.   cyclobenzaprine 10 MG tablet Commonly known as: FLEXERIL Take by mouth.   fluticasone 50 MCG/ACT nasal spray Commonly known as: FLONASE Reported on 07/29/2015   HYDROcodone-acetaminophen 5-325 MG tablet Commonly known as: NORCO/VICODIN 5-325 tablets. 1/2 to 1 tablet 4 times a day   lisinopril-hydrochlorothiazide 10-12.5 MG tablet Commonly known as: ZESTORETIC Take 1 tablet by mouth daily. am   MULTIVITAMIN ADULT PO Take by mouth.   Neurontin 300 MG capsule Generic drug: gabapentin Take 300 mg by mouth 4 (four) times daily.   pantoprazole 40 MG tablet Commonly known as: PROTONIX 40 mg 2 (two) times daily.   pravastatin 20 MG tablet Commonly known as: PRAVACHOL Take 20 mg by mouth daily.   ProAir HFA 108 (90 Base) MCG/ACT inhaler Generic drug: albuterol Inhale into the lungs. Reported on 07/29/2015   psyllium 58.6 % packet Commonly known as: METAMUCIL Take 1 packet by mouth daily.   tamsulosin 0.4 MG Caps capsule Commonly known as: FLOMAX Take 1 capsule (0.4 mg total) by mouth daily.   Testosterone 75 MG Pllt Inject into the skin.   warfarin 5 MG tablet Commonly known as: COUMADIN Take by mouth. 1/2 to 1 a day       Allergies:  Allergies  Allergen Reactions  . Penicillins     Other reaction(s):unknown,as a child    Family History: Family History  Problem Relation [name] of Onset  . Thrombosis Other   . Skin cancer Father   . Kidney disease Neg Hx   . Prostate cancer Neg Hx   . Kidney cancer Neg Hx   . Bladder Cancer Neg Hx     Social History:  reports that he has never smoked. He has never  used smokeless tobacco. He reports that he does not drink alcohol and does not use drugs.  ROS: Pertinent ROS in HPI  Physical Exam: BP 131/72   Pulse 88   Ht 6\' 1"  (1.854 m)   Wt (!) 310 lb (140.6 kg)   BMI 40.90 kg/m   Constitutional:  Well nourished. Alert and oriented, No acute distress. HEENT: [place] AT, mask in place.  Trachea midline. Cardiovascular: No clubbing, cyanosis, or edema. Respiratory: Normal respiratory effort, no increased work of breathing. Skin: No rashes,  bruises or suspicious lesions.  The Testopel insertion site is clean and dry.  No erythema is noted.   I palpated the tract and no tenderness or crepitus was noted.  I attempted to express any fluid with palpation, but I did not express any drainage.  Neurologic: Grossly intact, no focal deficits, moving all 4 extremities. Psychiatric: Normal mood and affect.  Laboratory Data: Lab Results  Component Value Date   WBC 9.7 07/11/2015   HGB 14.2 09/14/2019   HCT 46.0 09/14/2019   MCV 82.5 07/11/2015   PLT 198 07/11/2015    Lab Results  Component Value Date   CREATININE 0.89 08/19/2014    Lab Results  Component Value Date   TESTOSTERONE 265 10/19/2019    Lab Results  Component Value Date   TSH 0.872 04/10/2015    Lab Results  Component Value Date   AST 26 04/10/2015   Lab Results  Component Value Date   ALT 29 04/10/2015     Urinalysis    Component Value Date/Time   COLORURINE Yellow 08/19/2014 1320   APPEARANCEUR Clear 08/19/2014 1320   LABSPEC 1.023 08/19/2014 1320   PHURINE 5.0 08/19/2014 1320   GLUCOSEU Negative 08/19/2014 1320   HGBUR 1+ 08/19/2014 1320   BILIRUBINUR Negative 08/19/2014 1320   KETONESUR Negative 08/19/2014 1320   PROTEINUR Negative 08/19/2014 1320   NITRITE Negative 08/19/2014 1320   LEUKOCYTESUR Negative 08/19/2014 1320  I have reviewed the labs.   Pertinent Imaging: No imaging since last visit  Assessment & Plan:    1. Testosterone deficiency Explained  to the patient that there is a 1% incident of pellet extrusion after Testopel insertion.  He would like to pursue other modalities of testosterone therapy at this time.  He is mostly interested in an injectable form of therapy and I informed him that there was a IM testosterone cypionate injection that is typically given every 2 weeks, a testosterone enanthate that is given subcutaneously weekly and a testosterone undecanoate that is given every 10 weeks after a bolus of the initial injection and the second bolus 4 weeks.  He is mostly interested in the testosterone undecanoate.  I advised him of the risk of POME and the need to stay in the office for 30 minutes after every injection.  He understands and is agreeable to start the [place]. We will obtain a testosterone level today and if it is below 300, we will initiate AVEED injection if his insurance approves the medication  - Testosterone   Return for Pending testosterone level .  These notes generated with voice recognition software. I apologize for typographical errors.  Zara Council, PA-C  [facility] [address]  [place], [place] [address] [phone]

## 2019-10-22 ENCOUNTER — Telehealth: Payer: Self-pay | Admitting: Family Medicine

## 2019-10-22 NOTE — Telephone Encounter (Signed)
Will you let him know and get him scheduled for an AVEED injection when it has been three months since his Testopel?

## 2019-10-22 NOTE — Telephone Encounter (Signed)
-----   Message from Nori Riis, PA-C sent at 10/21/2019  3:27 PM EDT ----- Would you see if AVEED is covered by his insurance?

## 2019-10-22 NOTE — Telephone Encounter (Signed)
LMOM for patient to call back and schedule the Aveed injection.

## 2019-10-29 ENCOUNTER — Other Ambulatory Visit: Payer: Self-pay

## 2020-01-02 NOTE — Progress Notes (Signed)
Aveed Injection  Due to Hypogonadism patient is present today for a Aveed Injection.  Medication: Aveed Dose: 750mg / 25ml ACQ:[id number] Exp:07/08/2023 Location: right upper outer buttocks Injection of medication was given over a 60 second period of time.  Patient tolerated well, no complications were noted Patient remained in the office for 30 minutes post injection for monitoring and will report any side effects.  Performed by: Kerman Passey, RMA  Additional notes/ Follow up: RTC in one month for next AVEED

## 2020-01-03 ENCOUNTER — Other Ambulatory Visit: Payer: Self-pay

## 2020-01-03 ENCOUNTER — Ambulatory Visit (INDEPENDENT_AMBULATORY_CARE_PROVIDER_SITE_OTHER): Payer: Medicare Other | Admitting: Urology

## 2020-01-03 VITALS — BP 127/75 | HR 80

## 2020-01-03 DIAGNOSIS — E349 Endocrine disorder, unspecified: Secondary | ICD-10-CM

## 2020-01-03 MED ORDER — TESTOSTERONE UNDECANOATE 750 MG/3ML IM SOLN: Freq: Once | INTRAMUSCULAR | Status: AC

## 2020-01-08 IMAGING — CT CT ABD-PELV W/ CM
1 of 3 series · 14 of 32 positions shown, 19 images · IV contrast (APPLIED)
Comparison: Lumbar spine MR dated 09/09/2015.

CLINICAL DATA: Diarrhea and diffuse abdominal pain for the past 3
weeks. Left lower quadrant abdominal tenderness on physical
examination today.

EXAM:
CT ABDOMEN AND PELVIS WITH CONTRAST
TECHNIQUE: Multidetector CT imaging of the abdomen and pelvis was performed
using the standard protocol following bolus administration of
intravenous contrast.
CONTRAST:  100mL OMNIPAQUE IOHEXOL 300 MG/ML  SOLN

[Series 2: axial st · axial · 0.91mm/px · z∈[-518,-68]mm · 14 of 102 slices shown, 19 images]
[im 6/102  soft-tissue]
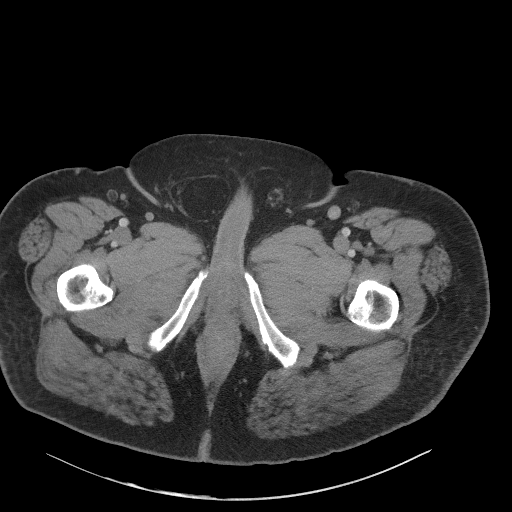
[im 6/102  bone]
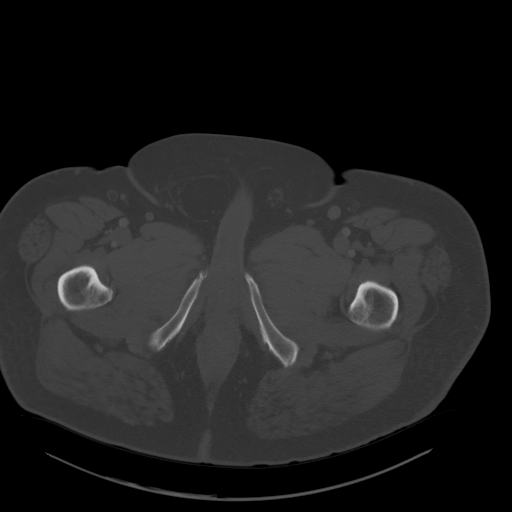
[im 16/102  soft-tissue]
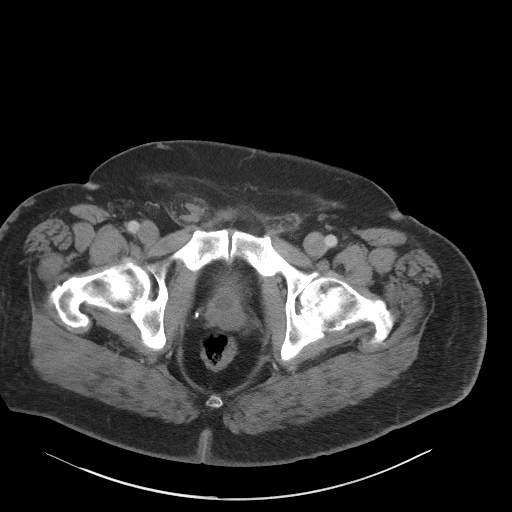
[im 22/102  soft-tissue]
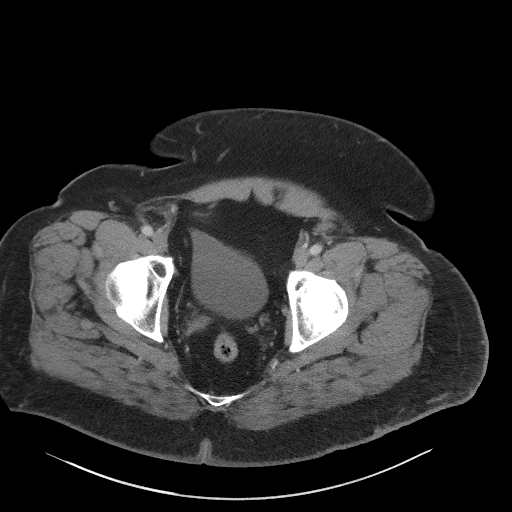
[im 27/102  soft-tissue]
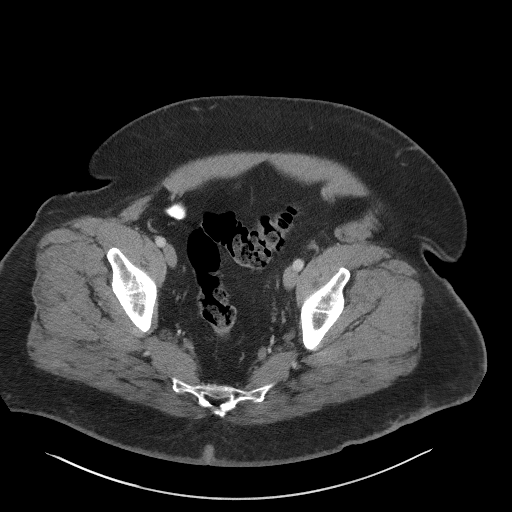
[im 38/102  soft-tissue]
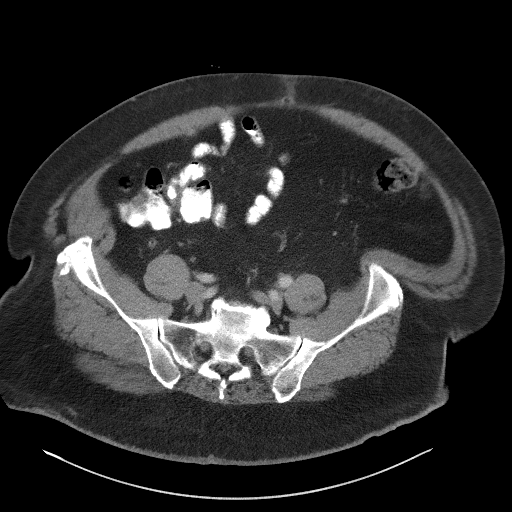
[im 43/102  soft-tissue]
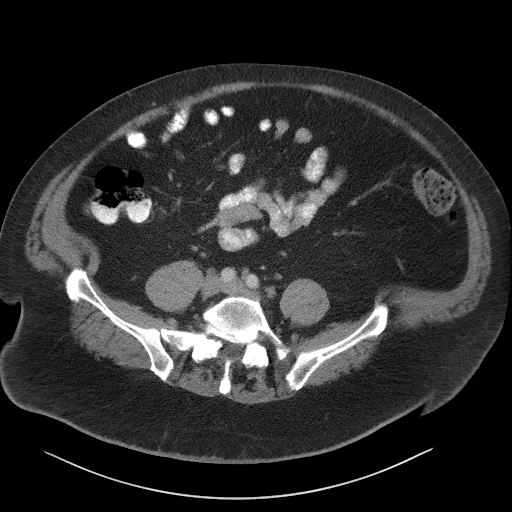
[im 54/102  soft-tissue]
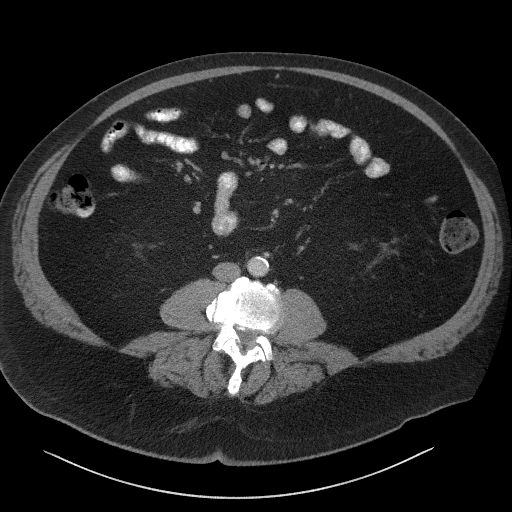
[im 59/102  soft-tissue]
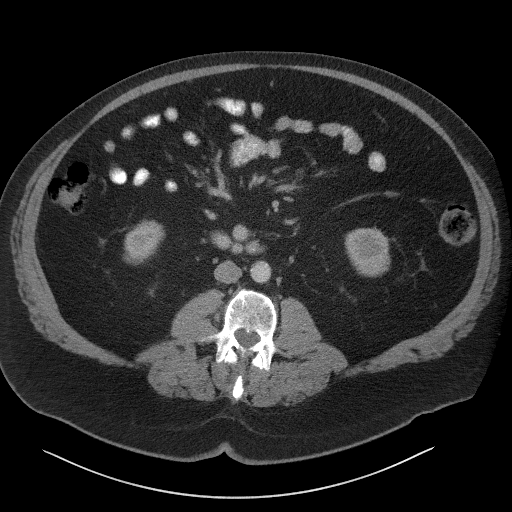
[im 64/102  soft-tissue]
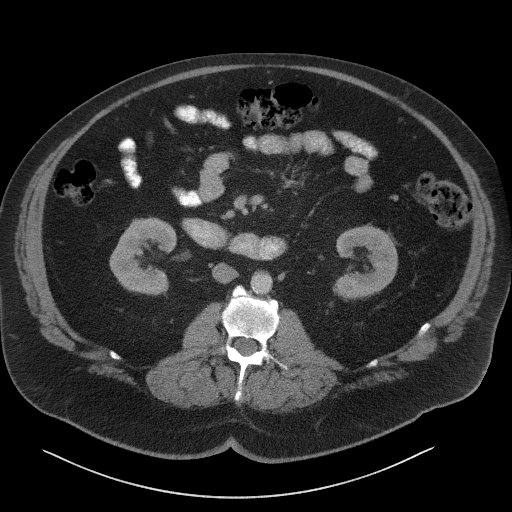
[im 64/102  bone]
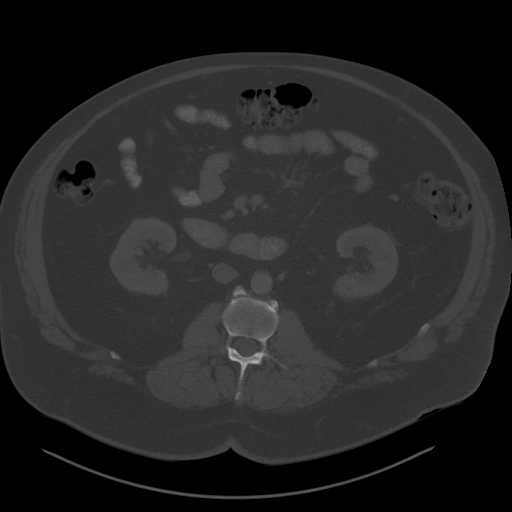
[im 75/102  soft-tissue]
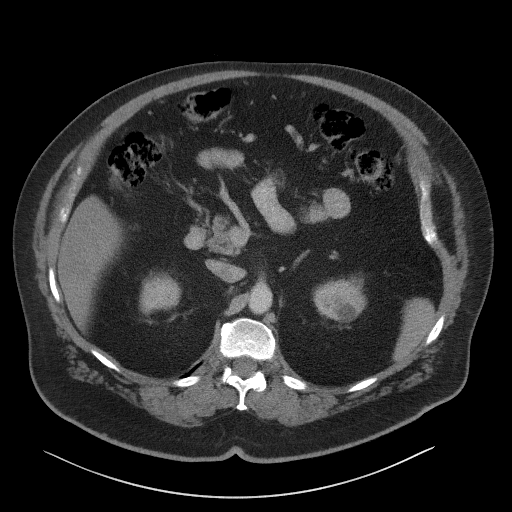
[im 80/102  soft-tissue]
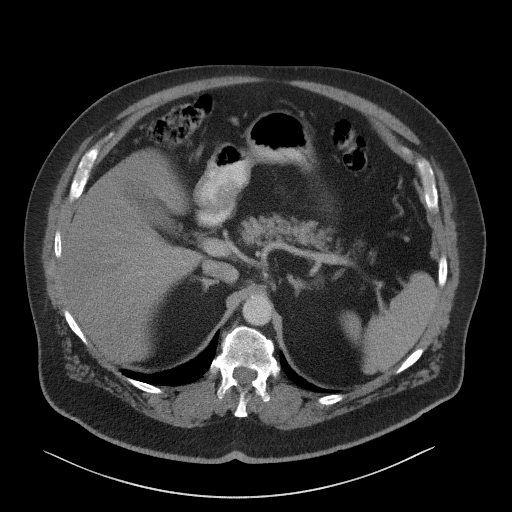
[im 80/102  lung]
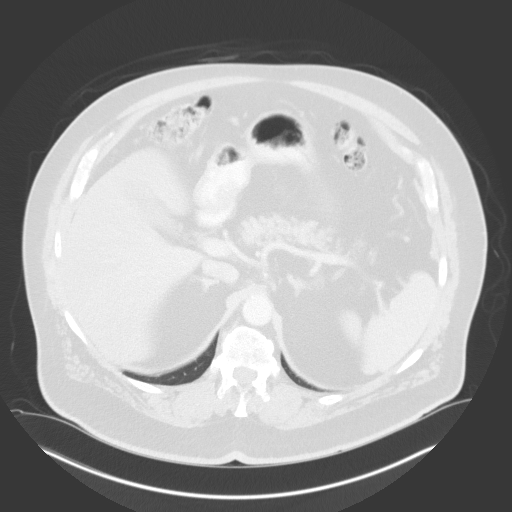
[im 86/102  soft-tissue]
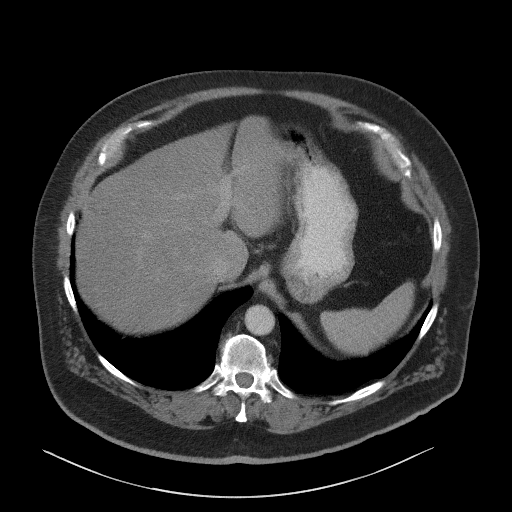
[im 86/102  lung]
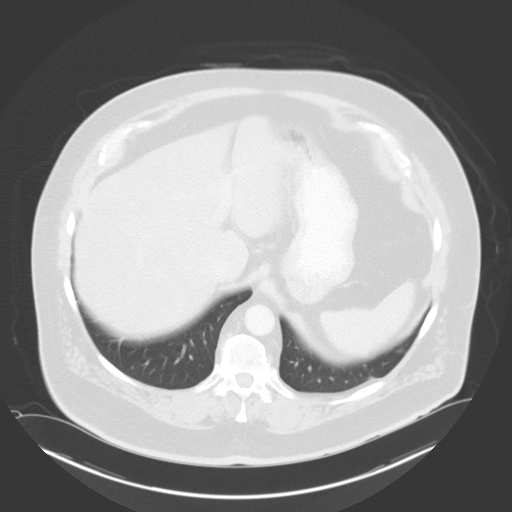
[im 91/102  lung]
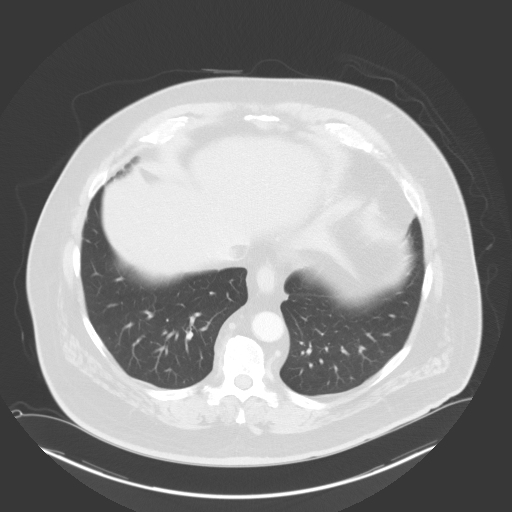
[im 96/102  soft-tissue]
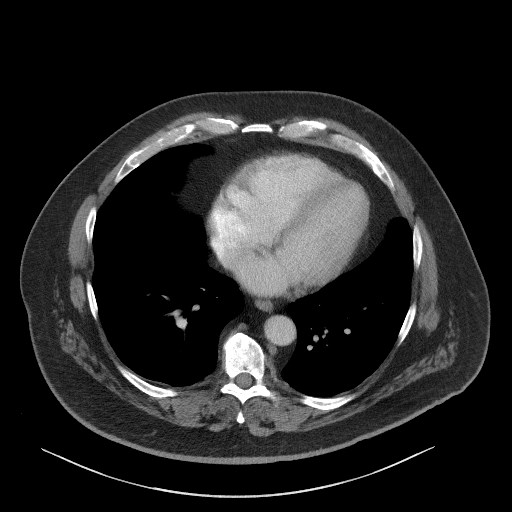
[im 96/102  lung]
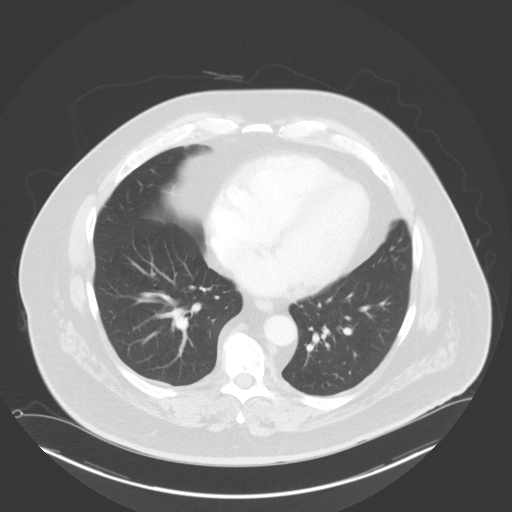

[14 of 32 positions shown; findings below may reference images not displayed]

FINDINGS: Lower chest: Minimal left basilar linear atelectasis or scarring.

Hepatobiliary: Diffuse low density of the liver relative to the
spleen. Normal appearing gallbladder.

Pancreas: Unremarkable. No pancreatic ductal dilatation or
surrounding inflammatory changes.

Spleen: Normal in size and shape. Tiny probable cyst or hemangioma
in the anterior portion of the spleen.

Adrenals/Urinary Tract: Normal appearing adrenal glands. Bilateral
renal cysts. Normal appearing ureters. A small portion of the
urinary bladder on the right extends into the proximal aspect of a
right inguinal hernia.

Stomach/Bowel: Stomach is within normal limits. Appendix appears
normal. No evidence of bowel wall thickening, distention, or
inflammatory changes.

Vascular/Lymphatic: Mild atheromatous arterial calcifications
without aneurysm. No enlarged lymph nodes.

Reproductive: Mildly enlarged prostate gland with a central TUR
defect.

Other: Moderate-sized right inguinal hernia containing fat. This
also contains a small portion of the urinary bladder proximal

Musculoskeletal: Lumbar and lower thoracic spine degenerative
changes.
IMPRESSION: 1. No acute abnormality.
2. Moderate-sized right inguinal hernia containing fat and a small
portion of the urinary bladder.
3. Diffuse hepatic steatosis.

## 2020-01-31 ENCOUNTER — Ambulatory Visit: Payer: Medicare Other

## 2020-02-06 ENCOUNTER — Other Ambulatory Visit: Payer: Self-pay | Admitting: Urology

## 2020-02-06 DIAGNOSIS — N138 Other obstructive and reflux uropathy: Secondary | ICD-10-CM

## 2020-02-19 ENCOUNTER — Other Ambulatory Visit: Payer: Self-pay

## 2020-02-19 ENCOUNTER — Telehealth: Payer: Self-pay

## 2020-02-19 ENCOUNTER — Ambulatory Visit (INDEPENDENT_AMBULATORY_CARE_PROVIDER_SITE_OTHER): Payer: Medicare Other

## 2020-02-19 DIAGNOSIS — E349 Endocrine disorder, unspecified: Secondary | ICD-10-CM | POA: Diagnosis not present

## 2020-02-19 DIAGNOSIS — N138 Other obstructive and reflux uropathy: Secondary | ICD-10-CM

## 2020-02-19 DIAGNOSIS — E291 Testicular hypofunction: Secondary | ICD-10-CM

## 2020-02-19 MED ORDER — TESTOSTERONE UNDECANOATE 750 MG/3ML IM SOLN
Freq: Once | INTRAMUSCULAR | Status: AC
  Administered 2020-02-19: 3 mL via INTRAMUSCULAR

## 2020-02-19 NOTE — Progress Notes (Signed)
Aveed Injection  Due to Hypogonadism patient is present today for a Aveed Injection.  Medication: Aveed Dose: 750mg / 24ml [id number] Exp:06/2023 Location: left upper outer buttocks Injection of medication was given over a 60 second period of time.  Patient tolerated well, no complications were noted Patient remained in the office for 30 minutes post injection for monitoring and will report any side effects.  Performed by: Bradly Bienenstock CMA  Additional notes/ Follow up: RTC in 10 weeks for next injection.

## 2020-02-19 NOTE — Telephone Encounter (Signed)
error 

## 2020-03-17 DIAGNOSIS — Z8679 Personal history of other diseases of the circulatory system: Secondary | ICD-10-CM | POA: Insufficient documentation

## 2020-04-09 DIAGNOSIS — Z86711 Personal history of pulmonary embolism: Secondary | ICD-10-CM | POA: Insufficient documentation

## 2020-04-09 DIAGNOSIS — G894 Chronic pain syndrome: Secondary | ICD-10-CM | POA: Insufficient documentation

## 2020-04-09 DIAGNOSIS — Z96619 Presence of unspecified artificial shoulder joint: Secondary | ICD-10-CM | POA: Insufficient documentation

## 2020-04-09 DIAGNOSIS — Z86718 Personal history of other venous thrombosis and embolism: Secondary | ICD-10-CM | POA: Insufficient documentation

## 2020-04-28 ENCOUNTER — Other Ambulatory Visit: Payer: Medicare Other

## 2020-04-28 ENCOUNTER — Other Ambulatory Visit: Payer: Self-pay

## 2020-04-28 DIAGNOSIS — E349 Endocrine disorder, unspecified: Secondary | ICD-10-CM

## 2020-04-28 DIAGNOSIS — N401 Enlarged prostate with lower urinary tract symptoms: Secondary | ICD-10-CM

## 2020-04-28 DIAGNOSIS — E291 Testicular hypofunction: Secondary | ICD-10-CM

## 2020-04-29 ENCOUNTER — Ambulatory Visit (INDEPENDENT_AMBULATORY_CARE_PROVIDER_SITE_OTHER): Payer: Medicare Other | Admitting: Urology

## 2020-04-29 ENCOUNTER — Encounter: Payer: Self-pay | Admitting: Urology

## 2020-04-29 VITALS — BP 151/73 | HR 89 | Ht 73.0 in | Wt 310.0 lb

## 2020-04-29 DIAGNOSIS — E291 Testicular hypofunction: Secondary | ICD-10-CM | POA: Diagnosis not present

## 2020-04-29 LAB — HEMOGLOBIN AND HEMATOCRIT, BLOOD
Hematocrit: 42.8 % (ref 37.5–51.0)
Hemoglobin: 13.4 g/dL (ref 13.0–17.7)

## 2020-04-29 LAB — TESTOSTERONE: Testosterone: 313 ng/dL (ref 264–916)

## 2020-04-29 LAB — PSA: Prostate Specific Ag, Serum: 0.7 ng/mL (ref 0.0–4.0)

## 2020-04-29 MED ORDER — TESTOSTERONE UNDECANOATE 750 MG/3ML IM SOLN: Freq: Once | INTRAMUSCULAR | Status: AC

## 2020-04-29 NOTE — Progress Notes (Signed)
Aveed Injection  Due to Hypogonadism patient is present today for a Aveed Injection.  Medication: Aveed Dose: 750mg / 51ml [id number] Exp:06/2023 Location: right upper outer buttocks Injection of medication was given over a 60 second period of time.  Patient tolerated well, no complications were noted Patient remained in the office for 30 minutes post injection for monitoring and will report any side effects.  Preformed NI:DPOEUM Louretta Shorten, CMA  Additional notes/ Follow up: 10 weeks

## 2020-06-26 ENCOUNTER — Other Ambulatory Visit: Payer: Self-pay

## 2020-06-26 DIAGNOSIS — E291 Testicular hypofunction: Secondary | ICD-10-CM

## 2020-06-26 DIAGNOSIS — E349 Endocrine disorder, unspecified: Secondary | ICD-10-CM

## 2020-06-27 ENCOUNTER — Other Ambulatory Visit: Payer: Self-pay

## 2020-06-27 ENCOUNTER — Other Ambulatory Visit: Payer: Medicare Other

## 2020-06-27 DIAGNOSIS — E291 Testicular hypofunction: Secondary | ICD-10-CM

## 2020-06-27 DIAGNOSIS — E349 Endocrine disorder, unspecified: Secondary | ICD-10-CM

## 2020-06-28 LAB — HEMOGLOBIN AND HEMATOCRIT, BLOOD
Hematocrit: 41.7 % (ref 37.5–51.0)
Hemoglobin: 13.2 g/dL (ref 13.0–17.7)

## 2020-06-28 LAB — TESTOSTERONE: Testosterone: 358 ng/dL (ref 264–916)

## 2020-07-08 ENCOUNTER — Other Ambulatory Visit: Payer: Self-pay

## 2020-07-08 ENCOUNTER — Ambulatory Visit (INDEPENDENT_AMBULATORY_CARE_PROVIDER_SITE_OTHER): Payer: Medicare Other | Admitting: Urology

## 2020-07-08 DIAGNOSIS — E349 Endocrine disorder, unspecified: Secondary | ICD-10-CM

## 2020-07-08 NOTE — Progress Notes (Signed)
Aveed Injection  Due to Hypogonadism patient is present today for a Aveed Injection.  Medication: Aveed Dose: 750mg / 53ml [id number] Exp:06/2023 Location: right upper outer buttocks Injection of medication was given over a 60 second period of time.  Patient tolerated well, no complications were noted Patient remained in the office for 30 minutes post injection for monitoring and will report any side effects.  Preformed by: Elberta Leatherwood, CMA  Additional notes/ Follow up:

## 2020-08-26 ENCOUNTER — Other Ambulatory Visit: Payer: Self-pay | Admitting: *Deleted

## 2020-08-26 DIAGNOSIS — E349 Endocrine disorder, unspecified: Secondary | ICD-10-CM

## 2020-08-26 DIAGNOSIS — R972 Elevated prostate specific antigen [PSA]: Secondary | ICD-10-CM

## 2020-08-26 DIAGNOSIS — E291 Testicular hypofunction: Secondary | ICD-10-CM

## 2020-10-02 ENCOUNTER — Other Ambulatory Visit: Payer: Medicare Other

## 2020-10-02 ENCOUNTER — Ambulatory Visit: Payer: Self-pay | Admitting: Urology

## 2020-10-02 ENCOUNTER — Other Ambulatory Visit: Payer: Self-pay

## 2020-10-02 DIAGNOSIS — R972 Elevated prostate specific antigen [PSA]: Secondary | ICD-10-CM

## 2020-10-02 DIAGNOSIS — E291 Testicular hypofunction: Secondary | ICD-10-CM

## 2020-10-03 LAB — HEMATOCRIT: Hematocrit: 45.5 % (ref 37.5–51.0)

## 2020-10-03 LAB — TESTOSTERONE: Testosterone: 199 ng/dL — ABNORMAL LOW (ref 264–916)

## 2020-10-03 LAB — PSA: Prostate Specific Ag, Serum: 0.6 ng/mL (ref 0.0–4.0)

## 2020-10-14 NOTE — Progress Notes (Signed)
07/06/2018  10:47 AM   Star Age [birth year] [id number]  Referring provider: Ezequiel Kayser, MD [facility] [place],  [place] [id number]  Chief Complaint  Patient presents with  . Benign Prostatic Hypertrophy  . Hypogonadism  . Erectile Dysfunction   Urological history: 1.  Testosterone deficiency -testosterone level 199 in 10/2020 -HCT normal in 10/2020 -managed with AVEED   2. Sleep apnea -sleeps with CPAP  3. BPH with LU TS -PSA 0.6 in 10/2020  -I PSS 14/2 -s/p HoLEP 2016 -managed with tamsulosin 0.4 mg daily  4. ED -contributing factors of age, BPH, testosterone deficiency, DM, HTN, HLD, spinal injury, COPD, anticoagulation therapy and sleep apnea -SHIM 8 -Managed with tadalafil 20 mg, on-demand dosing   HPI: Lance [name] is a 70 y.o. male who presents today for follow up.   He still has an occasional split stream.  Patient denies any modifying or aggravating factors.  Patient denies any gross hematuria, dysuria or suprapubic/flank pain.  Patient denies any fevers, chills, nausea or vomiting.     IPSS    Row Name 10/15/20 1000         International Prostate Symptom Score   How often have you had the sensation of not emptying your bladder? Less than 1 in 5     How often have you had to urinate less than every two hours? About half the time     How often have you found you stopped and started again several times when you urinated? More than half the time     How often have you found it difficult to postpone urination? Less than 1 in 5 times     How often have you had a weak urinary stream? Less than half the time     How often have you had to strain to start urination? Less than half the time     How many times did you typically get up at night to urinate? 1 Time     Total IPSS Score 14           Quality of Life due to urinary symptoms   If you were to spend the rest of your life with your urinary condition just the  way it is now how would you feel about that? Mostly Satisfied            Score:  1-7 Mild 8-19 Moderate 20-35 Severe  Patient is not having spontaneous erections.   He denies any pain or curvature with erections.    [place] Name 10/15/20 1030         SHIM: Over the last 6 months:   How do you rate your confidence that you could get and keep an erection? Very Low     When you had erections with sexual stimulation, how often were your erections hard enough for penetration (entering your partner)? A Few Times (much less than half the time)     During sexual intercourse, how often were you able to maintain your erection after you had penetrated (entered) your partner? A Few Times (much less than half the time)     During sexual intercourse, how difficult was it to maintain your erection to completion of intercourse? Very Difficult     When you attempted sexual intercourse, how often was it satisfactory for you? Almost Never or Never           SHIM Total Score  SHIM 8           Score: 1-7 Severe ED 8-11 Moderate ED 12-16 Mild-Moderate ED 17-21 Mild ED 22-25 No ED  PMH: Past Medical History:  Diagnosis Date  . Acute cystitis   . Allergic rhinitis   . BPH (benign prostatic hyperplasia)   . Bronchitis    chronic  . Carpal tunnel syndrome   . CHF (congestive heart failure) ([place])   . Complication of anesthesia    long time to wake after fatty tumor surgery  . DDD (degenerative disc disease), lumbar    whole body, joints  . Degenerative lumbar spinal stenosis    chronic back pain, left leg and foot  . Diabetes mellitus ([place])    diet controlled  . DISH (diffuse idiopathic skeletal hyperostosis)    neck pain  . Dyspnea    with exertion, wheezing occasionally  . Erectile dysfunction   . GERD (gastroesophageal reflux disease)   . Gout   . Headache    migraines-occasionally  . History of DVT (deep vein thrombosis)    left leg  . History of migraine headaches    . History of pulmonary embolism    bilateral  . HOH (hard of hearing)    wears aids  . Hypercholesterolemia   . Hypercoagulable state ([place])   . Hypertension    controlled on meds  . Hypogonadism in male   . Infection of skin   . Iron deficiency anemia   . Lower urinary tract symptoms   . Meralgia paresthetica   . Neck pain   . Obesity   . Post-void dribbling   . Prostatitis   . Sleep apnea    with CPAP  . SNHL (sensorineural hearing loss)   . Vertigo    occasional    Surgical History: Past Surgical History:  Procedure Laterality Date  . Blood vessel Surgery    . CARPAL TUNNEL RELEASE Bilateral 2011  . CATARACT EXTRACTION W/PHACO Right 10/17/2017   Procedure: CATARACT EXTRACTION PHACO AND INTRAOCULAR LENS PLACEMENT ([place])  RIGHT DIABETIC;  Surgeon: Leandrew Koyanagi, MD;  Location: [facility];  Service: Ophthalmology;  Laterality: Right;  CPAP Diet controlled diabetic  . CATARACT EXTRACTION W/PHACO Left 11/23/2017   Procedure: CATARACT EXTRACTION PHACO AND INTRAOCULAR LENS PLACEMENT ([place])  LEFT DIABETIC;  Surgeon: Leandrew Koyanagi, MD;  Location: [facility];  Service: Ophthalmology;  Laterality: Left;  Diabetic  sleep apnea  . COLONOSCOPY WITH PROPOFOL N/A 07/11/2015   Procedure: COLONOSCOPY WITH PROPOFOL;  Surgeon: Lollie Sails, MD;  Location: [facility] ENDOSCOPY;  Service: Endoscopy;  Laterality: N/A;  . COLONOSCOPY WITH PROPOFOL N/A 02/02/2019   Procedure: COLONOSCOPY WITH PROPOFOL;  Surgeon: Lollie Sails, MD;  Location: [facility] ENDOSCOPY;  Service: Endoscopy;  Laterality: N/A;  . FOOT SURGERY Bilateral   . FOOT SURGERY Left 1025   complicated by DVT  . [place]  . HERNIA REPAIR Bilateral 1985   x2  . HOLEP-LASER ENUCLEATION OF THE PROSTATE WITH MORCELLATION  08/28/2014   Dr. Hollice Espy  . HYDROCELE EXCISION / REPAIR  1985   x2  . Hydrocelectomy     x 2  . JOINT REPLACEMENT     left knee, right shoulder  . LEG SURGERY  Right 1974   fatty tumor  . PROSTATE SURGERY    . REPLACEMENT TOTAL KNEE Left 2012  . SHOULDER SURGERY Right 1968  . TONSILLECTOMY  1958  . TOTAL SHOULDER ARTHROPLASTY Right 09/18/2014   arthroplasty, glenohumeral  joint; total shoulder (glenoid and prosimal humeral replacement)  . VASECTOMY  1979    Home Medications:  Allergies as of 10/15/2020      Reactions   Penicillins    Other reaction(s):unknown,as a child      Medication List       Accurate as of October 15, 2020 10:47 AM. If you have any questions, ask your nurse or doctor.        STOP taking these medications   amoxicillin 500 MG capsule Commonly known as: AMOXIL Stopped by: Colleen Donahoe, PA-C     TAKE these medications   albuterol 108 (90 Base) MCG/ACT inhaler Commonly known as: VENTOLIN HFA Inhale into the lungs. Reported on 07/29/2015   cetirizine 10 MG chewable tablet Commonly known as: ZYRTEC Chew 10 mg by mouth daily.   cyclobenzaprine 5 MG tablet Commonly known as: FLEXERIL   fluticasone 50 MCG/ACT nasal spray Commonly known as: FLONASE Reported on 07/29/2015   gabapentin 300 MG capsule Commonly known as: NEURONTIN Take 300 mg by mouth 4 (four) times daily.   HYDROcodone-acetaminophen 5-325 MG tablet Commonly known as: NORCO/VICODIN 5-325 tablets. 1/2 to 1 tablet 4 times a day   lisinopril-hydrochlorothiazide 10-12.5 MG tablet Commonly known as: ZESTORETIC Take 1 tablet by mouth daily. am   MULTIVITAMIN ADULT PO Take by mouth.   pantoprazole 40 MG tablet Commonly known as: PROTONIX 40 mg 2 (two) times daily.   pravastatin 20 MG tablet Commonly known as: PRAVACHOL Take 20 mg by mouth daily.   psyllium 58.6 % packet Commonly known as: METAMUCIL Take 1 packet by mouth daily.   tadalafil 20 MG tablet Commonly known as: CIALIS Take 1 tablet (20 mg total) by mouth daily as needed for erectile dysfunction. Started by: Zara Council, PA-C   tamsulosin 0.4 MG Caps capsule Commonly  known as: FLOMAX TAKE 1 CAPSULE EVERY DAY   Testosterone 75 MG Pllt Inject into the skin.   warfarin 5 MG tablet Commonly known as: COUMADIN Take by mouth. 1/2 to 1 a day       Allergies:  Allergies  Allergen Reactions  . Penicillins     Other reaction(s):unknown,as a child    Family History: Family History  Problem Relation Age of Onset  . Thrombosis Other   . Skin cancer Father   . Kidney disease Neg Hx   . Prostate cancer Neg Hx   . Kidney cancer Neg Hx   . Bladder Cancer Neg Hx     Social History:  reports that he has never smoked. He has never used smokeless tobacco. He reports that he does not drink alcohol and does not use drugs.  ROS: For pertinent review of systems please refer to history of present illness  Physical Exam: BP 134/77   Pulse 73   Ht 6\' 1"  (1.854 m)   Wt 300 lb (136.1 kg)   BMI 39.58 kg/m   Constitutional:  Well nourished. Alert and oriented, No acute distress. HEENT: [place] AT, mask in place.  Trachea midline Cardiovascular: No clubbing, cyanosis, or edema. Respiratory: Normal respiratory effort, no increased work of breathing. GU: No CVA tenderness.  No bladder fullness or masses.  Patient with circumcised phallus.  Urethral meatus is patent.  No penile discharge. No penile lesions or rashes. Scrotum without lesions, cysts, rashes and/or edema.  Testicles are located scrotally bilaterally. No masses are appreciated in the testicles. Left and right epididymis are normal. Rectal: Patient with  normal sphincter tone. Anus and perineum without  scarring or rashes. No rectal masses are appreciated. Prostate is approximately 50 grams, currently felt the apex in the midportion of the gland, no nodules are appreciated. Seminal vesicles could not be palpated Neurologic: Grossly intact, no focal deficits, moving all 4 extremities. Psychiatric: Normal mood and affect.   Laboratory Data: Results for orders placed or performed in visit on 10/02/20   Hematocrit  Result Value Ref Range   Hematocrit 45.5 37.5 - 51.0 %  Testosterone  Result Value Ref Range   Testosterone 199 (L) 264 - 916 ng/dL  PSA  Result Value Ref Range   Prostate Specific Ag, Serum 0.6 0.0 - 4.0 ng/mL   WBC (White Blood Cell Count) 4.1 - 10.2 10^3/uL 7.3   RBC (Red Blood Cell Count) 4.69 - 6.13 10^6/uL 5.11   Hemoglobin 14.1 - 18.1 gm/dL 13.0Low   Hematocrit 40.0 - 52.0 % 41.3   MCV (Mean Corpuscular Volume) 80.0 - 100.0 fl 80.8   MCH (Mean Corpuscular Hemoglobin) 27.0 - 31.2 pg 25.4Low   MCHC (Mean Corpuscular Hemoglobin Concentration) 32.0 - 36.0 gm/dL 31.5Low   Platelet Count 150 - 450 10^3/uL 272   RDW-CV (Red Cell Distribution Width) 11.6 - 14.8 % 19.9High   MPV (Mean Platelet Volume) 9.4 - 12.4 fl 10.0   Neutrophils 1.50 - 7.80 10^3/uL 4.04   Lymphocytes 1.00 - 3.60 10^3/uL 2.43   Monocytes 0.00 - 1.50 10^3/uL 0.70   Eosinophils 0.00 - 0.55 10^3/uL 0.07   Basophils 0.00 - 0.09 10^3/uL 0.04   Neutrophil % 32.0 - 70.0 % 55.5   Lymphocyte % 10.0 - 50.0 % 33.3   Monocyte % 4.0 - 13.0 % 9.6   Eosinophil % 1.0 - 5.0 % 1.0   Basophil% 0.0 - 2.0 % 0.5   Immature Granulocyte % <=0.7 % 0.1   Immature Granulocyte Count <=0.06 10^3/L 0.01   Resulting Agency  [facility]  Specimen Collected: 09/17/20 14:59 Last Resulted: 09/18/20 11:40  Received From: [place]  Result Received: 09/24/20 06:50   Color Yellow Yellow   Clarity Clear Clear   Specific Gravity 1.000 - 1.030 1.025   pH, Urine 5.0 - 8.0 5.0   Protein, Urinalysis Negative, Trace mg/dL Negative   Glucose, Urinalysis Negative mg/dL Negative   Ketones, Urinalysis Negative mg/dL Negative   Blood, Urinalysis Negative TraceAbnormal   Nitrite, Urinalysis Negative Negative   Leukocyte Esterase, Urinalysis Negative Negative   White Blood Cells, Urinalysis None Seen, 0-3 /hpf 0-3   Red Blood Cells, Urinalysis None Seen, 0-3 /hpf None Seen   Bacteria,  Urinalysis None Seen /hpf None Seen   Squamous Epithelial Cells, Urinalysis Rare, Few, None Seen /hpf None Seen   Resulting Agency  [facility]  Specimen Collected: 11/15/19 08:59 Last Resulted: 11/15/19 11:36  Received From: [place]  Result Received: 08/26/20 15:33   Hemoglobin A1C <6.5 % 6.4   Average Blood Glucose (Calculated From HgBA1c Level) mg/dL 137   Resulting Agency  [facility]   Narrative Performed by [place] The [employer] has made the following Recommendations:  HBA1C results between 5.7% and 6.4% are suggestive of prediabetes  HBA1C result greater than or equal to 6.5% are diagnostic of diabetes Specimen Collected: 03/17/20 13:54 Last Resulted: 03/17/20 19:22  Received From: [facility]  Result Received: 08/26/20 15:33   I have reviewed the labs.  Assessment & Plan:    1. Testosterone deficiency  -testosterone level  was not therapeutic -received AVEED today    2. BPH with LUTS -Continue tamsulosin 0.4 mg daily -RTC in 6 months for IPSS, PSA, PVR and exam, as testosterone therapy can cause prostate enlargement and worsen LUTS  3. Erectile dysfunction:    -Sent in prescription for tadalafil 20 mg on demand dosing to [facility] so he could use good Rx coupon #30 -RTC in 6 months for SHIM score and exam, as testosterone therapy can affect erections   Return for AVEED in 10 days and office visit in 6 months with blood work prior .  These notes generated with voice recognition software. I apologize for typographical errors.  Zara Council, PA-C  [facility] [address] [place], [place] [address] [phone]

## 2020-10-15 ENCOUNTER — Encounter: Payer: Self-pay | Admitting: Urology

## 2020-10-15 ENCOUNTER — Other Ambulatory Visit: Payer: Self-pay

## 2020-10-15 ENCOUNTER — Ambulatory Visit (INDEPENDENT_AMBULATORY_CARE_PROVIDER_SITE_OTHER): Payer: Medicare Other | Admitting: Urology

## 2020-10-15 VITALS — BP 134/77 | HR 73 | Ht 73.0 in | Wt 300.0 lb

## 2020-10-15 DIAGNOSIS — N529 Male erectile dysfunction, unspecified: Secondary | ICD-10-CM

## 2020-10-15 DIAGNOSIS — E291 Testicular hypofunction: Secondary | ICD-10-CM

## 2020-10-15 DIAGNOSIS — N138 Other obstructive and reflux uropathy: Secondary | ICD-10-CM

## 2020-10-15 DIAGNOSIS — N401 Enlarged prostate with lower urinary tract symptoms: Secondary | ICD-10-CM

## 2020-10-15 DIAGNOSIS — E349 Endocrine disorder, unspecified: Secondary | ICD-10-CM | POA: Diagnosis not present

## 2020-10-15 MED ORDER — TESTOSTERONE UNDECANOATE 750 MG/3ML IM SOLN
750.0000 mg | Freq: Once | INTRAMUSCULAR | Status: AC
  Administered 2020-10-15: 750 mg via INTRAMUSCULAR

## 2020-10-15 MED ORDER — TADALAFIL 20 MG PO TABS: 20.0000 mg | ORAL_TABLET | Freq: Every day | ORAL | 3 refills | Status: DC | PRN

## 2020-10-15 NOTE — Progress Notes (Signed)
Aveed Injection  Due to Hypogonadism patient is present today for a Aveed Injection.  Medication: Aveed Dose: 750mg / 28ml [id number] Exp:06/2023 Location: right upper outer buttocks Injection of medication was given over a 60 second period of time.  Patient tolerated well, no complications were noted Patient remained in the office for 30 minutes post injection for monitoring and will report any side effects.  Performed by: Bradly Bienenstock, CMA  Additional notes/ Follow up: RTC in 10 weeks for next Aveed.

## 2020-12-08 ENCOUNTER — Other Ambulatory Visit: Payer: Self-pay | Admitting: Urology

## 2020-12-08 DIAGNOSIS — N138 Other obstructive and reflux uropathy: Secondary | ICD-10-CM

## 2020-12-08 DIAGNOSIS — N401 Enlarged prostate with lower urinary tract symptoms: Secondary | ICD-10-CM

## 2020-12-26 ENCOUNTER — Ambulatory Visit (INDEPENDENT_AMBULATORY_CARE_PROVIDER_SITE_OTHER): Payer: Medicare Other | Admitting: *Deleted

## 2020-12-26 ENCOUNTER — Other Ambulatory Visit: Payer: Self-pay

## 2020-12-26 DIAGNOSIS — E291 Testicular hypofunction: Secondary | ICD-10-CM | POA: Diagnosis not present

## 2020-12-26 DIAGNOSIS — E349 Endocrine disorder, unspecified: Secondary | ICD-10-CM

## 2020-12-26 MED ORDER — TESTOSTERONE UNDECANOATE 750 MG/3ML IM SOLN: Freq: Once | INTRAMUSCULAR | Status: AC

## 2020-12-26 NOTE — Progress Notes (Signed)
  Aveed Injection   Due to Hypogonadism patient is present today for a Aveed Injection.   Medication: Aveed Dose: '750mg'$ / 102m [id number]:01/2024 Location: right upper outer buttocks Injection of medication was given over a 60 second period of time.  Patient tolerated well, no complications were noted Patient remained in the office for 30 minutes post injection for monitoring and will report any side effects.   Performed by: JGaspar Cola CMA

## 2021-01-23 ENCOUNTER — Telehealth: Payer: Self-pay | Admitting: *Deleted

## 2021-01-23 NOTE — Telephone Encounter (Signed)
Pt calling asking about a bill with DOS 09/26/2019, pt states he spoke with medicare and [place] billing about this bill for $300 for Testopel implant. Pt states medicare told him this bill needs to be resubmitted. Pt states [place] told him he was responsible. Pt would like some clarity on this bill and the coding.

## 2021-01-29 NOTE — Telephone Encounter (Signed)
Spoke with patient about his past due bill from 09/26/2019. I let him know that I was checking into it and that I have sent a message to our billing person and that once I have heard back from her I would call him back. I also let him know that [place] did not have anything to do with why Medicare didn't pay the whole claim. He was told by the [facility] billing to ask [facility]. Patient understood Sharyn Lull

## 2021-03-09 ENCOUNTER — Other Ambulatory Visit: Payer: Self-pay

## 2021-03-09 ENCOUNTER — Ambulatory Visit (INDEPENDENT_AMBULATORY_CARE_PROVIDER_SITE_OTHER): Payer: Medicare Other

## 2021-03-09 DIAGNOSIS — E291 Testicular hypofunction: Secondary | ICD-10-CM

## 2021-03-09 MED ORDER — TESTOSTERONE UNDECANOATE 750 MG/3ML IM SOLN
750.0000 mg | Freq: Once | INTRAMUSCULAR | Status: AC
  Administered 2021-03-09: 750 mg via INTRAMUSCULAR

## 2021-03-09 NOTE — Progress Notes (Signed)
Aveed Injection  Due to Hypogonadism patient is present today for a Aveed Injection.  Medication: Aveed Dose: 750mg / 55ml [id number] Exp:09/25 Location: right upper outer buttocks Injection of medication was given over a 60 second period of time.  Patient tolerated well, no complications were noted Patient remained in the office for 30 minutes post injection for monitoring and will report any side effects.  Performed by: Fonnie Jarvis, CMA  Additional notes/ Follow up: 10wk injection

## 2021-04-15 ENCOUNTER — Other Ambulatory Visit: Payer: Self-pay | Admitting: *Deleted

## 2021-04-15 DIAGNOSIS — R972 Elevated prostate specific antigen [PSA]: Secondary | ICD-10-CM

## 2021-04-15 DIAGNOSIS — E291 Testicular hypofunction: Secondary | ICD-10-CM

## 2021-04-16 ENCOUNTER — Other Ambulatory Visit: Payer: Self-pay

## 2021-04-16 ENCOUNTER — Other Ambulatory Visit: Payer: Medicare Other

## 2021-04-16 DIAGNOSIS — R972 Elevated prostate specific antigen [PSA]: Secondary | ICD-10-CM

## 2021-04-16 DIAGNOSIS — E291 Testicular hypofunction: Secondary | ICD-10-CM

## 2021-04-17 LAB — HEMOGLOBIN AND HEMATOCRIT, BLOOD
Hematocrit: 45.8 % (ref 37.5–51.0)
Hemoglobin: 14.2 g/dL (ref 13.0–17.7)

## 2021-04-17 LAB — PSA: Prostate Specific Ag, Serum: 0.4 ng/mL (ref 0.0–4.0)

## 2021-04-17 LAB — TESTOSTERONE: Testosterone: 347 ng/dL (ref 264–916)

## 2021-04-18 DIAGNOSIS — E559 Vitamin D deficiency, unspecified: Secondary | ICD-10-CM | POA: Insufficient documentation

## 2021-04-20 NOTE — Progress Notes (Signed)
07/06/2018  9:46 AM   Lance [name] 10/28/1950 [id number]  Referring provider: Ezequiel Kayser, MD [facility] [place],  [place] [address]  Chief Complaint  Patient presents with   Hypogonadism    Urological history: 1.  Testosterone deficiency -contributing factors of [name], obesity, diabetes and chronic pain medications -testosterone level 347 in 04/2021 -HCT and hemoglobin in 04/2021  -managed with AVEED -last injection 03/09/2021  2. BPH with LU TS -PSA 0.4 in 04/2021  -s/p HoLEP 2016 -I PSS 15/2 -managed with tamsulosin 0.4 mg daily  4. ED -contributing factors of [name], BPH, testosterone deficiency, DM, HTN, HLD, spinal injury, COPD, anticoagulation therapy and sleep apnea (sleeps with CPAP) -SHIM 8 -Managed with tadalafil 20 mg, on-demand dosing  5. Bilateral renal cysts -contrast CT 01/2021 - Bilateral renal cysts measuring up to 4 cm in the left mid kidney   HPI: Lance [name] is a 70 y.o. male who presents today for follow up.   Was treated for pyelonephritis in 01/2021.    He has post-void dribbling.  Patient denies any modifying or aggravating factors.  Patient denies any gross hematuria, dysuria or suprapubic/flank pain.  Patient denies any fevers, chills, nausea or vomiting.     IPSS     Row Name 04/21/21 0900         International Prostate Symptom Score   How often have you had the sensation of not emptying your bladder? Less than 1 in 5     How often have you had to urinate less than every two hours? More than half the time     How often have you found you stopped and started again several times when you urinated? More than half the time     How often have you found it difficult to postpone urination? Less than 1 in 5 times     How often have you had a weak urinary stream? Less than half the time     How often have you had to strain to start urination? Less than half the time     How many times did you typically  get up at night to urinate? 1 Time     Total IPSS Score 15       Quality of Life due to urinary symptoms   If you were to spend the rest of your life with your urinary condition just the way it is now how would you feel about that? Mostly Satisfied               Score:  1-7 Mild 8-19 Moderate 20-35 Severe  Patient is not having spontaneous erections.  He denies any pain or curvature with erections.  Not sexually active due to arthritic pain.     SHIM     Row Name 04/21/21 0919         SHIM: Over the last 6 months:   How do you rate your confidence that you could get and keep an erection? Low     When you had erections with sexual stimulation, how often were your erections hard enough for penetration (entering your partner)? Almost Never or Never     During sexual intercourse, how often were you able to maintain your erection after you had penetrated (entered) your partner? A Few Times (much less than half the time)     During sexual intercourse, how difficult was it to maintain your erection to completion of intercourse? Very Difficult  When you attempted sexual intercourse, how often was it satisfactory for you? Almost Never or Never       SHIM Total Score   SHIM 8              Score: 1-7 Severe ED 8-11 Moderate ED 12-16 Mild-Moderate ED 17-21 Mild ED 22-25 No ED  PMH: Past Medical History:  Diagnosis Date   Acute cystitis    Allergic rhinitis    BPH (benign prostatic hyperplasia)    Bronchitis    chronic   Carpal tunnel syndrome    CHF (congestive heart failure) (HCC)    Complication of anesthesia    long time to wake after fatty tumor surgery   DDD (degenerative disc disease), lumbar    whole body, joints   Degenerative lumbar spinal stenosis    chronic back pain, left leg and foot   Diabetes mellitus (HCC)    diet controlled   DISH (diffuse idiopathic skeletal hyperostosis)    neck pain   Dyspnea    with exertion, wheezing occasionally    Erectile dysfunction    GERD (gastroesophageal reflux disease)    Gout    Headache    migraines-occasionally   History of DVT (deep vein thrombosis)    left leg   History of migraine headaches    History of pulmonary embolism    bilateral   HOH (hard of hearing)    wears aids   Hypercholesterolemia    Hypercoagulable state ([place])    Hypertension    controlled on meds   Hypogonadism in male    Infection of skin    Iron deficiency anemia    Lower urinary tract symptoms    Meralgia paresthetica    Neck pain    Obesity    Post-void dribbling    Prostatitis    Sleep apnea    with CPAP   SNHL (sensorineural hearing loss)    Vertigo    occasional    Surgical History: Past Surgical History:  Procedure Laterality Date   Blood vessel Surgery     CARPAL TUNNEL RELEASE Bilateral 2011   CATARACT EXTRACTION W/PHACO Right 10/17/2017   Procedure: CATARACT EXTRACTION PHACO AND INTRAOCULAR LENS PLACEMENT ([place])  RIGHT DIABETIC;  Surgeon: Leandrew Koyanagi, MD;  Location: [place];  Service: Ophthalmology;  Laterality: Right;  CPAP Diet controlled diabetic   CATARACT EXTRACTION W/PHACO Left 11/23/2017   Procedure: CATARACT EXTRACTION PHACO AND INTRAOCULAR LENS PLACEMENT ([place])  LEFT DIABETIC;  Surgeon: Leandrew Koyanagi, MD;  Location: [place];  Service: Ophthalmology;  Laterality: Left;  Diabetic  sleep apnea   COLONOSCOPY WITH PROPOFOL N/A 07/11/2015   Procedure: COLONOSCOPY WITH PROPOFOL;  Surgeon: Lollie Sails, MD;  Location: [facility] ENDOSCOPY;  Service: Endoscopy;  Laterality: N/A;   COLONOSCOPY WITH PROPOFOL N/A 02/02/2019   Procedure: COLONOSCOPY WITH PROPOFOL;  Surgeon: Lollie Sails, MD;  Location: [facility] ENDOSCOPY;  Service: Endoscopy;  Laterality: N/A;   FOOT SURGERY Bilateral    FOOT SURGERY Left 0051   complicated by DVT   [place] Bilateral 1985   x2   [place] WITH MORCELLATION   08/28/2014   Dr. Hollice Espy   HYDROCELE EXCISION / REPAIR  1985   x2   Hydrocelectomy     x 2   JOINT REPLACEMENT     left knee, right shoulder   LEG SURGERY Right 1974   fatty tumor   PROSTATE SURGERY  REPLACEMENT TOTAL KNEE Left 2012   SHOULDER SURGERY Right 1968   TONSILLECTOMY  1958   TOTAL SHOULDER ARTHROPLASTY Right 09/18/2014   arthroplasty, glenohumeral joint; total shoulder (glenoid and prosimal humeral replacement)   VASECTOMY  1979    Home Medications:  Allergies as of 04/21/2021       Reactions   Penicillins    Other reaction(s):unknown,as a child        Medication List        Accurate as of April 21, 2021  9:46 AM. If you have any questions, ask your nurse or doctor.          albuterol 108 (90 Base) MCG/ACT inhaler Commonly known as: VENTOLIN HFA Inhale into the lungs. Reported on 07/29/2015   cetirizine 10 MG chewable tablet Commonly known as: ZYRTEC Chew 10 mg by mouth daily.   Cholecalciferol 50 MCG (2000 UT) Caps Take 3 capsules daily for 3 months, then reduce to 1 capsule daily thereafter for Vitamin D Deficiency.   cyclobenzaprine 5 MG tablet Commonly known as: FLEXERIL   fluticasone 50 MCG/ACT nasal spray Commonly known as: FLONASE Reported on 07/29/2015   gabapentin 300 MG capsule Commonly known as: NEURONTIN Take 300 mg by mouth 4 (four) times daily.   HYDROcodone-acetaminophen 5-325 MG tablet Commonly known as: NORCO/VICODIN 5-325 tablets. 1/2 to 1 tablet 4 times a day   lisinopril-hydrochlorothiazide 10-12.5 MG tablet Commonly known as: ZESTORETIC Take 1 tablet by mouth daily. am   MULTIVITAMIN ADULT PO Take by mouth.   pantoprazole 40 MG tablet Commonly known as: PROTONIX 40 mg 2 (two) times daily.   pravastatin 20 MG tablet Commonly known as: PRAVACHOL Take 20 mg by mouth daily.   psyllium 58.6 % packet Commonly known as: METAMUCIL Take 1 packet by mouth daily.   Semaglutide(0.25 or 0.5MG/DOS) 2  MG/1.5ML Sopn Inject into the skin.   tadalafil 20 MG tablet Commonly known as: CIALIS Take 1 tablet (20 mg total) by mouth daily as needed for erectile dysfunction.   tamsulosin 0.4 MG Caps capsule Commonly known as: FLOMAX TAKE 1 CAPSULE EVERY DAY   Testosterone 75 MG Pllt Inject into the skin.   warfarin 5 MG tablet Commonly known as: COUMADIN Take by mouth. 1/2 to 1 a day        Allergies:  Allergies  Allergen Reactions   Penicillins     Other reaction(s):unknown,as a child    Family History: Family History  Problem Relation [name] of Onset   Thrombosis Other    Skin cancer Father    Kidney disease Neg Hx    Prostate cancer Neg Hx    Kidney cancer Neg Hx    Bladder Cancer Neg Hx     Social History:  reports that he has never smoked. He has never used smokeless tobacco. He reports that he does not drink alcohol and does not use drugs.  ROS: For pertinent review of systems please refer to history of present illness  Physical Exam: BP (!) 152/72   Pulse 89   Ht _0  (1.854 m)   Wt (!) 305 lb (138.3 kg)   BMI 40.24 kg/m   Constitutional:  Well nourished. Alert and oriented, No acute distress. HEENT: [place] AT, mask in place.  Trachea midline Cardiovascular: No clubbing, cyanosis, or edema. Respiratory: Normal respiratory effort, no increased work of breathing. GU: No CVA tenderness.  No bladder fullness or masses.  Patient with circumcised phallus.  Urethral meatus is patent.  No penile discharge.  No penile lesions or rashes. Scrotum without lesions, cysts, rashes and/or edema.  Testicles are located scrotally bilaterally. No masses are appreciated in the testicles. Left and right epididymis are normal. Rectal: Patient with  normal sphincter tone. Anus and perineum without scarring or rashes. No rectal masses are appreciated. Prostate is approximately 50 grams, no nodules are appreciated. Seminal vesicles could not be palpated Neurologic: Grossly intact, no focal  deficits, moving all 4 extremities. Psychiatric: Normal mood and affect.   Laboratory Data: Results for orders placed or performed in visit on 04/16/21  Hemoglobin and hematocrit, blood  Result Value Ref Range   Hemoglobin 14.2 13.0 - 17.7 g/dL   Hematocrit 45.8 37.5 - 51.0 %  Testosterone  Result Value Ref Range   Testosterone 347 264 - 916 ng/dL  PSA  Result Value Ref Range   Prostate Specific Ag, Serum 0.4 0.0 - 4.0 ng/mL   WBC (White Blood Cell Count) 4.1 - 10.2 10^3/uL 9.4   RBC (Red Blood Cell Count) 4.69 - 6.13 10^6/uL 5.56   Hemoglobin 14.1 - 18.1 gm/dL 14.1   Hematocrit 40.0 - 52.0 % 45.9   MCV (Mean Corpuscular Volume) 80.0 - 100.0 fl 82.6   MCH (Mean Corpuscular Hemoglobin) 27.0 - 31.2 pg 25.4 Low    MCHC (Mean Corpuscular Hemoglobin Concentration) 32.0 - 36.0 gm/dL 30.7 Low    Platelet Count 150 - 450 10^3/uL 282   RDW-CV (Red Cell Distribution Width) 11.6 - 14.8 % 17.6 High    MPV (Mean Platelet Volume) 9.4 - 12.4 fl 9.2 Low    Neutrophils 1.50 - 7.80 10^3/uL 6.30   Lymphocytes 1.00 - 3.60 10^3/uL 2.30   Mixed Count 0.10 - 0.90 10^3/uL 0.80   Neutrophil % 32.0 - 70.0 % 66.5   Lymphocyte % 10.0 - 50.0 % 24.8   Mixed % 3.0 - 14.4 % 8.7   Resulting Agency  [facility] - LAB  Specimen Collected: 04/16/21 14:28 Last Resulted: 04/16/21 15:27  Received From: [facility]  Result Received: 04/20/21 20:08   Glucose 70 - 110 mg/dL 102   Sodium 136 - 145 mmol/L 139   Potassium 3.6 - 5.1 mmol/L 4.6   Chloride 97 - 109 mmol/L 102   Carbon Dioxide (CO2) 22.0 - 32.0 mmol/L 35.2 High    Urea Nitrogen (BUN) 7 - 25 mg/dL 15   Creatinine 0.7 - 1.3 mg/dL 1.1   Glomerular Filtration Rate (eGFR), MDRD Estimate >60 mL/min/1.73sq m 66   Calcium 8.7 - 10.3 mg/dL 9.4   AST  8 - 39 U/L 25   ALT  6 - 57 U/L 26   Alk Phos (alkaline Phosphatase) 34 - 104 U/L 72   Albumin 3.5 - 4.8 g/dL 3.6   Bilirubin, Total 0.3 - 1.2 mg/dL 0.5   Protein, Total 6.1 - 7.9  g/dL 7.1   A/G Ratio 1.0 - 5.0 gm/dL 1.0   Resulting Agency  [facility] - [facility]  Specimen Collected: 04/16/21 14:28 Last Resulted: 04/17/21 13:51  Received From: [facility]  Result Received: 04/20/21 20:08   Hemoglobin A1C 4.2 - 5.6 % 6.8 High    Average Blood Glucose (Calc) mg/dL 148   Resulting Agency  [facility] - LAB  Narrative Performed by [facility] - [facility] Normal Range:    4.2 - 5.6%  Increased Risk:  5.7 - 6.4%  Diabetes:        >= 6.5%  Glycemic Control for adults with diabetes:  <  7%   Specimen Collected: 04/16/21 14:28 Last Resulted: 04/17/21 11:53  Received From: [facility]  Result Received: 04/20/21 20:08   Cholesterol, Total 100 - 200 mg/dL 155   Triglyceride 35 - 199 mg/dL 100   HDL (High Density Lipoprotein) Cholesterol 29.0 - 71.0 mg/dL 43.7   LDL Calculated 0 - 130 mg/dL 91   VLDL Cholesterol mg/dL 20   Cholesterol/HDL Ratio  3.5   Resulting Agency  [facility] - LAB  Specimen Collected: 04/16/21 14:28 Last Resulted: 04/17/21 13:51  Received From: [facility]  Result Received: 04/20/21 20:08   Thyroid Stimulating Hormone (TSH) 0.450-5.330 uIU/ml uIU/mL 1.268   Resulting Agency  [facility] - LAB  Specimen Collected: 04/16/21 14:28 Last Resulted: 04/17/21 12:01  Received From: [facility]  Result Received: 04/20/21 20:08   PSA (Prostate Specific Antigen), Total 0.10 - 4.00 ng/mL 0.40   Resulting Agency  [facility] - LAB  Narrative Performed by [facility] - LAB Test results were determined with Beckman Coulter Hybritech Assay. Values obtained with different assay methods cannot be used interchangeably in serial testing. Assay results should not be interpreted as absolute evidence of the presence or absence of malignant disease Specimen Collected: 04/16/21 14:28 Last Resulted: 04/17/21 12:09  Received From: [facility]  Result Received: 04/20/21 20:08  I have reviewed the labs.  Pertinent Imaging Anatomical Region Laterality Modality  Abdomen -- Computed Tomography  Pelvis -- --   Impression  Mild bilateral symmetric perinephric stranding, nonspecific. No CT evidence of cystitis. No hydronephrosis or nephrolithiasis.   Right-sided inguinal hernia containing fat and small portion of the right-sided bladder wall.   Additional chronic and incidental findings, as above. Narrative  EXAM: CT ABDOMEN PELVIS W CONTRAST  DATE: 01/15/2021 12:36 AM  ACCESSION: [id number]  DICTATED: 01/15/2021 1:12 AM  INTERPRETATION LOCATION: [place]   CLINICAL INDICATION: dysuria, leukocytosys, back pain concern for infected stone vs pyelonephritis.  urine not overwhelmingly infected ; Pyelonephritis, complicated ; Flank pain, kidney stone suspected     COMPARISON: None   TECHNIQUE: A helical CT scan of the abdomen and pelvis was obtained following IV contrast from the lung bases through the pubic symphysis. Images were reconstructed in the axial plane. Coronal and sagittal reformatted images were also provided for further evaluation.    FINDINGS:   LOWER THORAX: Partially visualized lung bases are clear. No pleural effusion.   HEPATOBILIARY: Hepatic steatosis without focal lesion. The gallbladder is present and otherwise unremarkable. No biliary dilatation.    SPLEEN: Unremarkable.  PANCREAS: Unremarkable.   ADRENALS: Unremarkable.  KIDNEYS/URETERS: Symmetric nephrograms. Bilateral symmetric perinephric stranding, nonspecific. Bilateral renal cysts measuring up to 4 cm in the left mid kidney. No hydronephrosis or nephrolithiasis.  BLADDER: Decompressed, limiting evaluation. Small portion of the right anterior bladder wall extends into large fat-containing right inguinal hernia. No surrounding inflammatory fat stranding.  PELVIC ORGANS: The central fluid attenuation in the prostate which may  represent a TURP defect.   GI TRACT: The stomach is decompressed. The duodenum is normal in course and caliber. No dilated or thick walled loops of bowel. The appendix is air-filled and unremarkable.   PERITONEUM/RETROPERITONEUM AND MESENTERY: No free air or fluid.  LYMPH NODES: No enlarged lymph nodes.  VESSELS: Normal in caliber. Calcified atherosclerotic disease of the abdominal aorta.   BONES AND SOFT TISSUES: Multilevel degenerative changes of spine and hips.   Right inguinal hernia containing fat and  small portion of the right anterior lateral bladder wall. No inflammatory stranding to suggest incarceration. Procedure Note  Pietryga, Melba Coon, MD - 01/15/2021  Formatting of this note might be different from the original.  EXAM: CT ABDOMEN PELVIS W CONTRAST  DATE: 01/15/2021 12:36 AM  ACCESSION: [id number] UN  DICTATED: 01/15/2021 1:12 AM  INTERPRETATION LOCATION: [place]   CLINICAL INDICATION: dysuria, leukocytosys, back pain concern for infected stone vs pyelonephritis.  urine not overwhelmingly infected ; Pyelonephritis, complicated ; Flank pain, kidney stone suspected     COMPARISON: None   TECHNIQUE: A helical CT scan of the abdomen and pelvis was obtained following IV contrast from the lung bases through the pubic symphysis. Images were reconstructed in the axial plane. Coronal and sagittal reformatted images were also provided for further evaluation.    FINDINGS:   LOWER THORAX: Partially visualized lung bases are clear. No pleural effusion.   HEPATOBILIARY: Hepatic steatosis without focal lesion. The gallbladder is present and otherwise unremarkable. No biliary dilatation.    SPLEEN: Unremarkable.  PANCREAS: Unremarkable.   ADRENALS: Unremarkable.  KIDNEYS/URETERS: Symmetric nephrograms. Bilateral symmetric perinephric stranding, nonspecific. Bilateral renal cysts measuring up to 4 cm in the left mid kidney. No hydronephrosis or nephrolithiasis.  BLADDER:  Decompressed, limiting evaluation. Small portion of the right anterior bladder wall extends into large fat-containing right inguinal hernia. No surrounding inflammatory fat stranding.  PELVIC ORGANS: The central fluid attenuation in the prostate which may represent a TURP defect.   GI TRACT: The stomach is decompressed. The duodenum is normal in course and caliber. No dilated or thick walled loops of bowel. The appendix is air-filled and unremarkable.   PERITONEUM/RETROPERITONEUM AND MESENTERY: No free air or fluid.  LYMPH NODES: No enlarged lymph nodes.  VESSELS: Normal in caliber. Calcified atherosclerotic disease of the abdominal aorta.   BONES AND SOFT TISSUES: Multilevel degenerative changes of spine and hips.   Right inguinal hernia containing fat and small portion of the right anterior lateral bladder wall. No inflammatory stranding to suggest incarceration.   IMPRESSION:  Mild bilateral symmetric perinephric stranding, nonspecific. No CT evidence of cystitis. No hydronephrosis or nephrolithiasis.   Right-sided inguinal hernia containing fat and small portion of the right-sided bladder wall.   Additional chronic and incidental findings, as above. Exam End: 01/15/21 00:36   Specimen Collected: 01/15/21 01:12 Last Resulted: 01/15/21 06:51  Received From: [facility]  Result Received: 01/23/21 14:10    Assessment & Plan:    1. Testosterone deficiency  -testosterone level was not therapeutic -received AVEED today    2. BPH with LUTS -PSA stable -DRE benign -UA 15 RBC's in 01/2021 - see #4 -PVR < 300 cc -continue conservative management, avoiding bladder irritants and timed voiding's -Continue tamsulosin 0.4 mg daily    3. Erectile dysfunction:    -not sexually active  4. Microscopic hematuria -Micro heme in September 2022 associated with pyelonephritis -Denies any gross heme -We will recheck UA when he returns for his Aveed injection in January   Return in about  6 months (around 10/20/2021) for PSA, testosterone, H & H, I PSS and exam.  These notes generated with voice recognition software. I apologize for typographical errors.  Zara Council, PA-C  [facility] [address] [place], [place] [address] [phone]

## 2021-04-21 ENCOUNTER — Ambulatory Visit (INDEPENDENT_AMBULATORY_CARE_PROVIDER_SITE_OTHER): Payer: Medicare Other | Admitting: Urology

## 2021-04-21 ENCOUNTER — Encounter: Payer: Self-pay | Admitting: Urology

## 2021-04-21 ENCOUNTER — Other Ambulatory Visit: Payer: Self-pay

## 2021-04-21 VITALS — BP 152/72 | HR 89 | Ht 73.0 in | Wt 305.0 lb

## 2021-04-21 DIAGNOSIS — E349 Endocrine disorder, unspecified: Secondary | ICD-10-CM

## 2021-04-21 DIAGNOSIS — N138 Other obstructive and reflux uropathy: Secondary | ICD-10-CM | POA: Diagnosis not present

## 2021-04-21 DIAGNOSIS — N401 Enlarged prostate with lower urinary tract symptoms: Secondary | ICD-10-CM | POA: Diagnosis not present

## 2021-04-21 DIAGNOSIS — N529 Male erectile dysfunction, unspecified: Secondary | ICD-10-CM

## 2021-05-12 ENCOUNTER — Ambulatory Visit (INDEPENDENT_AMBULATORY_CARE_PROVIDER_SITE_OTHER): Payer: Medicare Other | Admitting: Family Medicine

## 2021-05-12 ENCOUNTER — Other Ambulatory Visit: Payer: Self-pay

## 2021-05-12 DIAGNOSIS — E291 Testicular hypofunction: Secondary | ICD-10-CM | POA: Diagnosis not present

## 2021-05-12 DIAGNOSIS — E349 Endocrine disorder, unspecified: Secondary | ICD-10-CM

## 2021-05-12 MED ORDER — TESTOSTERONE UNDECANOATE 750 MG/3ML IM SOLN: Freq: Once | INTRAMUSCULAR | Status: AC

## 2021-05-12 NOTE — Progress Notes (Signed)
Aveed Injection  Due to Hypogonadism patient is present today for a Aveed Injection.  Medication: Aveed Dose: 750mg / 34ml [id number] Exp:06/2022 Location: right upper outer buttocks Injection of medication was given over a 60 second period of time.  Patient tolerated well, no complications were noted Patient remained in the office for 30 minutes post injection for monitoring and will report any side effects.  Performed by: Elberta Leatherwood, CMA  Additional notes/ Follow up: 10 weeks

## 2021-07-27 ENCOUNTER — Other Ambulatory Visit: Payer: Self-pay

## 2021-07-27 ENCOUNTER — Ambulatory Visit (INDEPENDENT_AMBULATORY_CARE_PROVIDER_SITE_OTHER): Payer: Medicare Other

## 2021-07-27 DIAGNOSIS — E349 Endocrine disorder, unspecified: Secondary | ICD-10-CM

## 2021-07-27 DIAGNOSIS — N529 Male erectile dysfunction, unspecified: Secondary | ICD-10-CM

## 2021-07-27 DIAGNOSIS — E291 Testicular hypofunction: Secondary | ICD-10-CM

## 2021-07-27 MED ORDER — TESTOSTERONE UNDECANOATE 750 MG/3ML IM SOLN
750.0000 mg | Freq: Once | INTRAMUSCULAR | Status: AC
  Administered 2021-07-27: 750 mg via INTRAMUSCULAR

## 2021-07-27 NOTE — Progress Notes (Signed)
Aveed Injection ? ?Due to Hypogonadism patient is present today for a Aveed Injection. ? ?Medication: Aveed ?Dose: '750mg'$ / 15m ?[id number] ?xp:11/2024  ?Location: left upper outer buttocks ?Injection of medication was given over a 60 second period of time.  ?Patient tolerated well, no complications were noted. ?Patient remained in the office for 30 minutes post injection for monitoring and will report any side effects. ? ?Performed by: OGordy Clement [facility] ? ?Additional notes/ Follow up: RTC in 10wks for Aveed injection  ? ?

## 2021-09-07 NOTE — Progress Notes (Deleted)
07/06/2018  8:34 PM   Star Age [birth year] [id number]  Referring provider: Ezequiel Kayser, MD [address] [place],  [place] [address]  No chief complaint on file.  Urological history: 1.  Testosterone deficiency -contributing factors of age, obesity, diabetes and chronic pain medications -testosterone level pending -HCT and hemoglobin pending  -managed with AVEED -last injection 07/27/2021  2. BPH with LU TS -PSA 0.4 in 04/2021  -s/p HoLEP 2016 -managed with tamsulosin 0.4 mg daily  3. ED -contributing factors of age, BPH, testosterone deficiency, DM, HTN, HLD, spinal injury, COPD, anticoagulation therapy and sleep apnea (sleeps with CPAP) -Managed with tadalafil 20 mg, on-demand dosing  5. Bilateral renal cysts -contrast CT 01/2021 - Bilateral renal cysts measuring up to 4 cm in the left mid kidney   HPI: Lance [name] is a 71 y.o. male who presents today for right sided kidney pain for three weeks.     PMH: Past Medical History:  Diagnosis Date   Acute cystitis    Allergic rhinitis    BPH (benign prostatic hyperplasia)    Bronchitis    chronic   Carpal tunnel syndrome    CHF (congestive heart failure) (HCC)    Complication of anesthesia    long time to wake after fatty tumor surgery   DDD (degenerative disc disease), lumbar    whole body, joints   Degenerative lumbar spinal stenosis    chronic back pain, left leg and foot   Diabetes mellitus (HCC)    diet controlled   DISH (diffuse idiopathic skeletal hyperostosis)    neck pain   Dyspnea    with exertion, wheezing occasionally   Erectile dysfunction    GERD (gastroesophageal reflux disease)    Gout    Headache    migraines-occasionally   History of DVT (deep vein thrombosis)    left leg   History of migraine headaches    History of pulmonary embolism    bilateral   HOH (hard of hearing)    wears aids   Hypercholesterolemia    Hypercoagulable state ([place])     Hypertension    controlled on meds   Hypogonadism in male    Infection of skin    Iron deficiency anemia    Lower urinary tract symptoms    Meralgia paresthetica    Neck pain    Obesity    Post-void dribbling    Prostatitis    Sleep apnea    with CPAP   SNHL (sensorineural hearing loss)    Vertigo    occasional    Surgical History: Past Surgical History:  Procedure Laterality Date   Blood vessel Surgery     CARPAL TUNNEL RELEASE Bilateral 2011   CATARACT EXTRACTION W/PHACO Right 10/17/2017   Procedure: CATARACT EXTRACTION PHACO AND INTRAOCULAR LENS PLACEMENT ([place])  RIGHT DIABETIC;  Surgeon: Leandrew Koyanagi, MD;  Location: [facility];  Service: Ophthalmology;  Laterality: Right;  CPAP Diet controlled diabetic   CATARACT EXTRACTION W/PHACO Left 11/23/2017   Procedure: CATARACT EXTRACTION PHACO AND INTRAOCULAR LENS PLACEMENT ([place])  LEFT DIABETIC;  Surgeon: Leandrew Koyanagi, MD;  Location: [facility];  Service: Ophthalmology;  Laterality: Left;  Diabetic  sleep apnea   COLONOSCOPY WITH PROPOFOL N/A 07/11/2015   Procedure: COLONOSCOPY WITH PROPOFOL;  Surgeon: Lollie Sails, MD;  Location: [facility] ENDOSCOPY;  Service: Endoscopy;  Laterality: N/A;   COLONOSCOPY WITH PROPOFOL N/A 02/02/2019   Procedure: COLONOSCOPY WITH PROPOFOL;  Surgeon: Lollie Sails, MD;  Location: [facility] ENDOSCOPY;  Service: Endoscopy;  Laterality: N/A;   FOOT SURGERY Bilateral    FOOT SURGERY Left 6948   complicated by DVT   [place] Bilateral 1985   x2   [place] WITH MORCELLATION  08/28/2014   Dr. Hollice Espy   HYDROCELE EXCISION / REPAIR  1985   x2   Hydrocelectomy     x 2   JOINT REPLACEMENT     left knee, right shoulder   LEG SURGERY Right 1974   fatty tumor   PROSTATE SURGERY     REPLACEMENT TOTAL KNEE Left 2012   SHOULDER SURGERY Right 1968   TONSILLECTOMY  1958   TOTAL SHOULDER ARTHROPLASTY Right  09/18/2014   arthroplasty, glenohumeral joint; total shoulder (glenoid and prosimal humeral replacement)   VASECTOMY  1979    Home Medications:  Allergies as of 09/08/2021       Reactions   Penicillins    Other reaction(s):unknown,as a child        Medication List        Accurate as of Sep 07, 2021  8:34 PM. If you have any questions, ask your nurse or doctor.          albuterol 108 (90 Base) MCG/ACT inhaler Commonly known as: VENTOLIN HFA Inhale into the lungs. Reported on 07/29/2015   cetirizine 10 MG chewable tablet Commonly known as: ZYRTEC Chew 10 mg by mouth daily.   Cholecalciferol 50 MCG (2000 UT) Caps Take 3 capsules daily for 3 months, then reduce to 1 capsule daily thereafter for Vitamin D Deficiency.   cyclobenzaprine 5 MG tablet Commonly known as: FLEXERIL   fluticasone 50 MCG/ACT nasal spray Commonly known as: FLONASE Reported on 07/29/2015   gabapentin 300 MG capsule Commonly known as: NEURONTIN Take 300 mg by mouth 4 (four) times daily.   HYDROcodone-acetaminophen 5-325 MG tablet Commonly known as: NORCO/VICODIN 5-325 tablets. 1/2 to 1 tablet 4 times a day   lisinopril-hydrochlorothiazide 10-12.5 MG tablet Commonly known as: ZESTORETIC Take 1 tablet by mouth daily. am   MULTIVITAMIN ADULT PO Take by mouth.   pantoprazole 40 MG tablet Commonly known as: PROTONIX 40 mg 2 (two) times daily.   pravastatin 20 MG tablet Commonly known as: PRAVACHOL Take 20 mg by mouth daily.   psyllium 58.6 % packet Commonly known as: METAMUCIL Take 1 packet by mouth daily.   tadalafil 20 MG tablet Commonly known as: CIALIS Take 1 tablet (20 mg total) by mouth daily as needed for erectile dysfunction.   tamsulosin 0.4 MG Caps capsule Commonly known as: FLOMAX TAKE 1 CAPSULE EVERY DAY   warfarin 5 MG tablet Commonly known as: COUMADIN Take by mouth. 1/2 to 1 a day        Allergies:  Allergies  Allergen Reactions   Penicillins     Other  reaction(s):unknown,as a child    Family History: Family History  Problem Relation Age of Onset   Thrombosis Other    Skin cancer Father    Kidney disease Neg Hx    Prostate cancer Neg Hx    Kidney cancer Neg Hx    Bladder Cancer Neg Hx     Social History:  reports that he has never smoked. He has never used smokeless tobacco. He reports that he does not drink alcohol and does not use drugs.  ROS: For pertinent review of systems please refer to history of present illness  Physical Exam: There  were no vitals taken for this visit.  Constitutional:  Well nourished. Alert and oriented, No acute distress. HEENT: [place] AT, moist mucus membranes.  Trachea midline Cardiovascular: No clubbing, cyanosis, or edema. Respiratory: Normal respiratory effort, no increased work of breathing. GU: No CVA tenderness.  No bladder fullness or masses.  Patient with circumcised/uncircumcised phallus. ***Foreskin easily retracted***  Urethral meatus is patent.  No penile discharge. No penile lesions or rashes. Scrotum without lesions, cysts, rashes and/or edema.  Testicles are located scrotally bilaterally. No masses are appreciated in the testicles. Left and right epididymis are normal. Rectal: Patient with  normal sphincter tone. Anus and perineum without scarring or rashes. No rectal masses are appreciated. Prostate is approximately *** grams, *** nodules are appreciated. Seminal vesicles are normal. Neurologic: Grossly intact, no focal deficits, moving all 4 extremities. Psychiatric: Normal mood and affect.   Laboratory Data: Urinalysis *** I have reviewed the labs.   Pertinent imaging No recent imaging since last visit  Assessment & Plan:    1. Testosterone deficiency  -testosterone levels pending -H & H pending -continue AVEED  2. Right sided pain -UA -urine sent for culture to rule out infection -KUB         No follow-ups on file.  These notes generated with voice recognition  software. I apologize for typographical errors.  Zara Council, PA-C  [facility] [address] [place], [place] [address] [phone]

## 2021-09-08 ENCOUNTER — Ambulatory Visit: Payer: Medicare Other | Admitting: Urology

## 2021-09-08 ENCOUNTER — Telehealth: Payer: Self-pay | Admitting: Urology

## 2021-09-08 DIAGNOSIS — E349 Endocrine disorder, unspecified: Secondary | ICD-10-CM

## 2021-09-08 DIAGNOSIS — R109 Unspecified abdominal pain: Secondary | ICD-10-CM

## 2021-09-08 NOTE — Telephone Encounter (Signed)
Pt just wanted his appt changed ?

## 2021-09-08 NOTE — Telephone Encounter (Signed)
pt had appt w/Shannon at 9:30 this morning and left message stating he was too weak to come in for appt and wants someone to give him a call. ?

## 2021-09-09 ENCOUNTER — Ambulatory Visit
Admission: RE | Admit: 2021-09-09 | Discharge: 2021-09-09 | Disposition: A | Discharge status: Home / Self Care | Payer: Medicare Other | Source: Ambulatory Visit | Attending: Urology | Admitting: Urology

## 2021-09-09 ENCOUNTER — Encounter: Payer: Self-pay | Admitting: Urology

## 2021-09-09 ENCOUNTER — Other Ambulatory Visit: Payer: Self-pay

## 2021-09-09 ENCOUNTER — Ambulatory Visit
Admission: RE | Admit: 2021-09-09 | Discharge: 2021-09-09 | Disposition: A | Discharge status: Home / Self Care | Payer: Medicare Other | Attending: Urology | Admitting: Urology

## 2021-09-09 ENCOUNTER — Ambulatory Visit (INDEPENDENT_AMBULATORY_CARE_PROVIDER_SITE_OTHER): Payer: Medicare Other | Admitting: Urology

## 2021-09-09 VITALS — BP 121/70 | HR 84 | Ht 72.0 in | Wt 299.0 lb

## 2021-09-09 DIAGNOSIS — R109 Unspecified abdominal pain: Secondary | ICD-10-CM

## 2021-09-09 DIAGNOSIS — N200 Calculus of kidney: Secondary | ICD-10-CM | POA: Diagnosis present

## 2021-09-09 DIAGNOSIS — E291 Testicular hypofunction: Secondary | ICD-10-CM | POA: Diagnosis not present

## 2021-09-09 DIAGNOSIS — N401 Enlarged prostate with lower urinary tract symptoms: Secondary | ICD-10-CM | POA: Diagnosis not present

## 2021-09-09 DIAGNOSIS — E349 Endocrine disorder, unspecified: Secondary | ICD-10-CM

## 2021-09-09 DIAGNOSIS — N23 Unspecified renal colic: Secondary | ICD-10-CM | POA: Diagnosis not present

## 2021-09-09 DIAGNOSIS — N138 Other obstructive and reflux uropathy: Secondary | ICD-10-CM

## 2021-09-09 DIAGNOSIS — R3129 Other microscopic hematuria: Secondary | ICD-10-CM

## 2021-09-09 LAB — URINALYSIS, COMPLETE
Bilirubin, UA: NEGATIVE
Glucose, UA: NEGATIVE
Ketones, UA: NEGATIVE
Leukocytes,UA: NEGATIVE
Nitrite, UA: NEGATIVE
Specific Gravity, UA: >1.03 — ABNORMAL HIGH (ref 1.005–1.030)
Urobilinogen, Ur: 0.2 mg/dL (ref 0.2–1.0)
pH, UA: 5.5 (ref 5.0–7.5)

## 2021-09-09 LAB — MICROSCOPIC EXAMINATION

## 2021-09-09 NOTE — Progress Notes (Signed)
? ? ?07/06/2018  ?8:22 AM  ? ?Star Age ?06/10/1950 ?[id number] ? ?Referring provider: Ezequiel Kayser, MD ?[address] ?[place],  [place] [address] ? ?Chief Complaint  ?Patient presents with  ? Hypogonadism  ? Benign Prostatic Hypertrophy  ? ?Urological history: ?1.  Testosterone deficiency ?-contributing factors of age, obesity, diabetes and chronic pain medications ?-testosterone level pending ?-HCT and hemoglobin pending  ?-managed with AVEED -last injection 07/27/2021 ? ?2. BPH with LU TS ?-PSA 0.4 in 04/2021  ?-s/p HoLEP 2016 ?-managed with tamsulosin 0.4 mg daily ? ?3. ED ?-contributing factors of age, BPH, testosterone deficiency, DM, HTN, HLD, spinal injury, COPD, anticoagulation therapy and sleep apnea (sleeps with CPAP) ?-Managed with tadalafil 20 mg, on-demand dosing ? ?5. Bilateral renal cysts ?-contrast CT 01/2021 - Bilateral renal cysts measuring up to 4 cm in the left mid kidney ? ?6. Bladder herniation ?-contrast CT 2022 - right inguinal hernia containing fat and small portion of the right anterior lateral bladder wall ? ? ?HPI: ?Lance [name] is a 71 y.o. male who presents today for right sided kidney pain for three weeks.  ? ?His right kidney has been hurting for three weeks.  The pain is located in the right flank up to the right upper back.  He also has been having some right scrotal pain as well for the last week.  He feels his insides have shifted as he has to lay on his left side to sleep secondary to the right shoulder surgery.  ? ?He woke up with his night shirt soaking wet and he had chills two days age.   ? ?Patient denies any modifying or aggravating factors.  Patient denies any gross hematuria, dysuria or suprapubic/flank pain.  Patient denies any nausea or vomiting.   ? ?UA 3-10 RBC's  ? ?KUB no stones appreciated ? ?CT renal stone no stones, small portion of the bladder herniated into the right inguinal area, unchanged from previous visit ? ?PMH: ?Past Medical  History:  ?Diagnosis Date  ? Acute cystitis   ? Allergic rhinitis   ? BPH (benign prostatic hyperplasia)   ? Bronchitis   ? chronic  ? Carpal tunnel syndrome   ? CHF (congestive heart failure) ([place])   ? Complication of anesthesia   ? long time to wake after fatty tumor surgery  ? DDD (degenerative disc disease), lumbar   ? whole body, joints  ? Degenerative lumbar spinal stenosis   ? chronic back pain, left leg and foot  ? Diabetes mellitus ([place])   ? diet controlled  ? DISH (diffuse idiopathic skeletal hyperostosis)   ? neck pain  ? Dyspnea   ? with exertion, wheezing occasionally  ? Erectile dysfunction   ? GERD (gastroesophageal reflux disease)   ? Gout   ? Headache   ? migraines-occasionally  ? History of DVT (deep vein thrombosis)   ? left leg  ? History of migraine headaches   ? History of pulmonary embolism   ? bilateral  ? HOH (hard of hearing)   ? wears aids  ? Hypercholesterolemia   ? Hypercoagulable state ([place])   ? Hypertension   ? controlled on meds  ? Hypogonadism in male   ? Infection of skin   ? Iron deficiency anemia   ? Lower urinary tract symptoms   ? Meralgia paresthetica   ? Neck pain   ? Obesity   ? Post-void dribbling   ? Prostatitis   ? Sleep apnea   ? with  CPAP  ? SNHL (sensorineural hearing loss)   ? Vertigo   ? occasional  ? ? ?Surgical History: ?Past Surgical History:  ?Procedure Laterality Date  ? Blood vessel Surgery    ? CARPAL TUNNEL RELEASE Bilateral 2011  ? CATARACT EXTRACTION W/PHACO Right 10/17/2017  ? Procedure: CATARACT EXTRACTION PHACO AND INTRAOCULAR LENS PLACEMENT ([place])  RIGHT DIABETIC;  Surgeon: Leandrew Koyanagi, MD;  Location: [facility];  Service: Ophthalmology;  Laterality: Right;  CPAP ?Diet controlled diabetic  ? CATARACT EXTRACTION W/PHACO Left 11/23/2017  ? Procedure: CATARACT EXTRACTION PHACO AND INTRAOCULAR LENS PLACEMENT ([place])  LEFT DIABETIC;  Surgeon: Leandrew Koyanagi, MD;  Location: [facility];  Service: Ophthalmology;  Laterality: Left;   Diabetic  ?sleep apnea  ? COLONOSCOPY WITH PROPOFOL N/A 07/11/2015  ? Procedure: COLONOSCOPY WITH PROPOFOL;  Surgeon: Lollie Sails, MD;  Location: [facility] ENDOSCOPY;  Service: Endoscopy;  Laterality: N/A;  ? COLONOSCOPY WITH PROPOFOL N/A 02/02/2019  ? Procedure: COLONOSCOPY WITH PROPOFOL;  Surgeon: Lollie Sails, MD;  Location: [facility] ENDOSCOPY;  Service: Endoscopy;  Laterality: N/A;  ? FOOT SURGERY Bilateral   ? FOOT SURGERY Left 2003  ? complicated by DVT  ? [place]  ? HERNIA REPAIR Bilateral 1985  ? x2  ? HOLEP-LASER ENUCLEATION OF THE PROSTATE WITH MORCELLATION  08/28/2014  ? Dr. Hollice Espy  ? HYDROCELE EXCISION / REPAIR  1985  ? x2  ? Hydrocelectomy    ? x 2  ? JOINT REPLACEMENT    ? left knee, right shoulder  ? LEG SURGERY Right 1974  ? fatty tumor  ? PROSTATE SURGERY    ? REPLACEMENT TOTAL KNEE Left 2012  ? SHOULDER SURGERY Right 1968  ? TONSILLECTOMY  1958  ? TOTAL SHOULDER ARTHROPLASTY Right 09/18/2014  ? arthroplasty, glenohumeral joint; total shoulder (glenoid and prosimal humeral replacement)  ? VASECTOMY  1979  ? ? ?Home Medications:  ?Allergies as of 09/09/2021   ? ?   Reactions  ? Penicillins   ? Other reaction(s):unknown,as a child  ? ?  ? ?  ?Medication List  ?  ? ?  ? Accurate as of Sep 09, 2021 11:59 PM. If you have any questions, ask your nurse or doctor.  ?  ?  ? ?  ? ?albuterol 108 (90 Base) MCG/ACT inhaler ?Commonly known as: VENTOLIN HFA ?Inhale into the lungs. Reported on 07/29/2015 ?  ?cetirizine 10 MG chewable tablet ?Commonly known as: ZYRTEC ?Chew 10 mg by mouth daily. ?  ?Cholecalciferol 50 MCG (2000 UT) Caps ?Take 3 capsules daily for 3 months, then reduce to 1 capsule daily thereafter for Vitamin D Deficiency. ?  ?cyclobenzaprine 5 MG tablet ?Commonly known as: FLEXERIL ?  ?fluticasone 50 MCG/ACT nasal spray ?Commonly known as: FLONASE ?Reported on 07/29/2015 ?  ?gabapentin 300 MG capsule ?Commonly known as: NEURONTIN ?Take 300 mg by mouth 4 (four) times daily. ?   ?HYDROcodone-acetaminophen 5-325 MG tablet ?Commonly known as: NORCO/VICODIN ?5-325 tablets. 1/2 to 1 tablet 4 times a day ?  ?lisinopril-hydrochlorothiazide 10-12.5 MG tablet ?Commonly known as: ZESTORETIC ?Take 1 tablet by mouth daily. am ?  ?MULTIVITAMIN ADULT PO ?Take by mouth. ?  ?pantoprazole 40 MG tablet ?Commonly known as: PROTONIX ?40 mg 2 (two) times daily. ?  ?pravastatin 20 MG tablet ?Commonly known as: PRAVACHOL ?Take 20 mg by mouth daily. ?  ?psyllium 58.6 % packet ?Commonly known as: METAMUCIL ?Take 1 packet by mouth daily. ?  ?tadalafil 20 MG tablet ?Commonly known as: CIALIS ?  Take 1 tablet (20 mg total) by mouth daily as needed for erectile dysfunction. ?  ?tamsulosin 0.4 MG Caps capsule ?Commonly known as: FLOMAX ?TAKE 1 CAPSULE EVERY DAY ?  ?warfarin 5 MG tablet ?Commonly known as: COUMADIN ?Take by mouth. 1/2 to 1 a day ?  ? ?  ? ? ?Allergies:  ?Allergies  ?Allergen Reactions  ? Penicillins   ?  Other reaction(s):unknown,as a child  ? ? ?Family History: ?Family History  ?Problem Relation Age of Onset  ? Thrombosis Other   ? Skin cancer Father   ? Kidney disease Neg Hx   ? Prostate cancer Neg Hx   ? Kidney cancer Neg Hx   ? Bladder Cancer Neg Hx   ? ? ?Social History:  reports that he has never smoked. He has never used smokeless tobacco. He reports that he does not drink alcohol and does not use drugs. ? ?ROS: ?For pertinent review of systems please refer to history of present illness ? ?Physical Exam: ?BP 121/70   Pulse 84   Ht 6' (1.829 m)   Wt 299 lb (135.6 kg)   BMI 40.55 kg/m?   ?Constitutional:  Well nourished. Alert and oriented, No acute distress. ?HEENT: [place] AT, moist mucus membranes.  Trachea midline ?Cardiovascular: No clubbing, cyanosis, or edema. ?Respiratory: Normal respiratory effort, no increased work of breathing. ?GU: No CVA tenderness.  No bladder fullness or masses.  Patient with buried phallus. Foreskin easily retracted.   Urethral meatus is patent.  No penile discharge.  No penile lesions or rashes. Scrotum without lesions, cysts, rashes and/or edema.  Testicles are located scrotally bilaterally. No masses are appreciated in the testicles. Left and right epididymis are normal.   ?Neu

## 2021-09-10 LAB — TESTOSTERONE: Testosterone: 303 ng/dL (ref 264–916)

## 2021-09-10 LAB — HEMOGLOBIN AND HEMATOCRIT, BLOOD
Hematocrit: 44.9 % (ref 37.5–51.0)
Hemoglobin: 14.1 g/dL (ref 13.0–17.7)

## 2021-09-11 ENCOUNTER — Other Ambulatory Visit: Payer: Self-pay | Admitting: Gerontology

## 2021-09-11 ENCOUNTER — Ambulatory Visit
Admission: RE | Admit: 2021-09-11 | Discharge: 2021-09-11 | Disposition: A | Discharge status: Home / Self Care | Payer: Medicare Other | Source: Ambulatory Visit | Attending: Gerontology | Admitting: Gerontology

## 2021-09-11 DIAGNOSIS — L539 Erythematous condition, unspecified: Secondary | ICD-10-CM | POA: Diagnosis present

## 2021-09-11 DIAGNOSIS — M7989 Other specified soft tissue disorders: Secondary | ICD-10-CM

## 2021-09-11 DIAGNOSIS — Z86718 Personal history of other venous thrombosis and embolism: Secondary | ICD-10-CM | POA: Diagnosis present

## 2021-09-12 LAB — CULTURE, URINE COMPREHENSIVE

## 2021-09-14 ENCOUNTER — Telehealth: Payer: Self-pay

## 2021-09-14 NOTE — Telephone Encounter (Signed)
Notified pt as advised. Attempted to schedule pt for cysto, pt declined at this time. Pt states he does not think he needs the cysto. Advised pt to call office if he decides he would like to proceed.  ?

## 2021-09-14 NOTE — Telephone Encounter (Signed)
-----   Message from Nori Riis, PA-C sent at 09/13/2021  3:37 PM EDT ----- ?Please let Mr. Coye know that his urine culture was negative and we need to proceed with the cysto with Dr. Erlene Quan for micro heme.  ?

## 2021-10-06 ENCOUNTER — Ambulatory Visit (INDEPENDENT_AMBULATORY_CARE_PROVIDER_SITE_OTHER): Payer: Medicare Other | Admitting: Urology

## 2021-10-06 DIAGNOSIS — E349 Endocrine disorder, unspecified: Secondary | ICD-10-CM

## 2021-10-06 DIAGNOSIS — E291 Testicular hypofunction: Secondary | ICD-10-CM | POA: Diagnosis not present

## 2021-10-06 MED ORDER — TESTOSTERONE UNDECANOATE 750 MG/3ML IM SOLN
750.0000 mg | Freq: Once | INTRAMUSCULAR | Status: AC
  Administered 2021-10-06: 750 mg via INTRAMUSCULAR

## 2021-10-06 NOTE — Progress Notes (Signed)
Aveed Injection  Due to Hypogonadism patient is present today for a Aveed Injection.  Medication: Aveed Dose: '750mg'$ / 92m LHAL:[id number]:11/2024 Location: right upper outer buttocks Injection of medication was given over a 60 second period of time.  Patient tolerated well, no complications were noted Patient remained in the office for 30 minutes post injection for monitoring and will report any side effects.  Performed by: CBradly BienenstockCMA

## 2021-10-20 ENCOUNTER — Other Ambulatory Visit: Payer: Medicare Other

## 2021-10-20 ENCOUNTER — Other Ambulatory Visit: Payer: Self-pay

## 2021-10-20 DIAGNOSIS — N401 Enlarged prostate with lower urinary tract symptoms: Secondary | ICD-10-CM

## 2021-10-20 DIAGNOSIS — E349 Endocrine disorder, unspecified: Secondary | ICD-10-CM

## 2021-10-21 LAB — HEMOGLOBIN: Hemoglobin: 13.3 g/dL (ref 13.0–17.7)

## 2021-10-21 LAB — PSA: Prostate Specific Ag, Serum: 0.5 ng/mL (ref 0.0–4.0)

## 2021-10-21 LAB — TESTOSTERONE: Testosterone: 570 ng/dL (ref 264–916)

## 2021-10-21 LAB — HEMATOCRIT: Hematocrit: 42.7 % (ref 37.5–51.0)

## 2021-10-22 ENCOUNTER — Encounter: Payer: Self-pay | Admitting: Urology

## 2021-10-22 ENCOUNTER — Ambulatory Visit (INDEPENDENT_AMBULATORY_CARE_PROVIDER_SITE_OTHER): Payer: Medicare Other | Admitting: Urology

## 2021-10-22 VITALS — BP 133/72 | HR 65 | Ht 73.0 in | Wt 300.0 lb

## 2021-10-22 DIAGNOSIS — N138 Other obstructive and reflux uropathy: Secondary | ICD-10-CM | POA: Diagnosis not present

## 2021-10-22 DIAGNOSIS — N401 Enlarged prostate with lower urinary tract symptoms: Secondary | ICD-10-CM

## 2021-10-22 DIAGNOSIS — E349 Endocrine disorder, unspecified: Secondary | ICD-10-CM

## 2021-10-22 DIAGNOSIS — N529 Male erectile dysfunction, unspecified: Secondary | ICD-10-CM

## 2021-10-22 DIAGNOSIS — E291 Testicular hypofunction: Secondary | ICD-10-CM | POA: Diagnosis not present

## 2021-10-22 NOTE — Progress Notes (Signed)
10/22/21 9:23 AM   Star Age [birth year] [id number]  Referring provider:  No referring provider defined for this encounter.  Urological history  1.  Testosterone deficiency -contributing factors of age, obesity, diabetes and chronic pain medications -testosterone level therapeutic at 570 10/2021 -HCT and hemoglobin WNL 10/2021  -managed with AVEED -last injection 10/06/2021   2. BPH with LU TS -PSA 0.5 in 10/20/2021 -s/p HoLEP 2016 -I PSS 9/2 -managed with tamsulosin 0.4 mg daily   3. ED -contributing factors of age, BPH, testosterone deficiency, DM, HTN, HLD, spinal injury, COPD, anticoagulation therapy and sleep apnea (sleeps with CPAP) -SHIM 5 -Managed with tadalafil 20 mg, on-demand dosing   5. Bilateral renal cysts -contrast CT 01/2021 - Bilateral renal cysts measuring up to 4 cm in the left mid kidney -stable on CT renal stone study 09/2021 - stable when compared to prior study    6. Bladder herniation -CT renal stone study 09/2021 - right inguinal hernia containing fat and small portion of the right anterior lateral bladder wall, unchanged when compared to prior CT in 2022   Chief Complaint  Patient presents with   Testosterone deficiency     HPI: Lance [name] is a 71 y.o.male who presents today for a 6 month follow-up with PSA, testosterone, and H&H labs prior and IPSS with exam.   He has an occasional split stream and urgency.  He is on diuretics and compression boots for his lower extremity edema.  Patient denies any modifying or aggravating factors.  Patient denies any gross hematuria, dysuria or suprapubic/flank pain. Patient denies any fevers, chills, nausea or vomiting.   IPSS     Row Name 10/22/21 0800         International Prostate Symptom Score   How often have you had the sensation of not emptying your bladder? Not at All     How often have you had to urinate less than every two hours? Less than half the time     How often have you  found you stopped and started again several times when you urinated? Less than half the time     How often have you found it difficult to postpone urination? Less than 1 in 5 times     How often have you had a weak urinary stream? Less than 1 in 5 times     How often have you had to strain to start urination? Less than half the time     How many times did you typically get up at night to urinate? 1 Time     Total IPSS Score 9       Quality of Life due to urinary symptoms   If you were to spend the rest of your life with your urinary condition just the way it is now how would you feel about that? Mostly Satisfied              Score:  1-7 Mild 8-19 Moderate 20-35 Severe  Patient is not having spontaneous erections.  He denies any pain or curvature with erections.  He took tadalafil the other day and he felt swimmy.  He stated that it did not help his erections.     [place] Name 10/22/21 0852         SHIM: Over the last 6 months:   How do you rate your confidence that you could get and keep an erection? Very Low     When you  had erections with sexual stimulation, how often were your erections hard enough for penetration (entering your partner)? Almost Never or Never     During sexual intercourse, how often were you able to maintain your erection after you had penetrated (entered) your partner? Almost Never or Never     During sexual intercourse, how difficult was it to maintain your erection to completion of intercourse? Extremely Difficult     When you attempted sexual intercourse, how often was it satisfactory for you? Almost Never or Never       SHIM Total Score   SHIM 5              Score: 1-7 Severe ED 8-11 Moderate ED 12-16 Mild-Moderate ED 17-21 Mild ED 22-25 No ED   PMH: Past Medical History:  Diagnosis Date   Acute cystitis    Allergic rhinitis    BPH (benign prostatic hyperplasia)    Bronchitis    chronic   Carpal tunnel syndrome    CHF  (congestive heart failure) (HCC)    Complication of anesthesia    long time to wake after fatty tumor surgery   DDD (degenerative disc disease), lumbar    whole body, joints   Degenerative lumbar spinal stenosis    chronic back pain, left leg and foot   Diabetes mellitus (HCC)    diet controlled   DISH (diffuse idiopathic skeletal hyperostosis)    neck pain   Dyspnea    with exertion, wheezing occasionally   Erectile dysfunction    GERD (gastroesophageal reflux disease)    Gout    Headache    migraines-occasionally   History of DVT (deep vein thrombosis)    left leg   History of migraine headaches    History of pulmonary embolism    bilateral   HOH (hard of hearing)    wears aids   Hypercholesterolemia    Hypercoagulable state ([place])    Hypertension    controlled on meds   Hypogonadism in male    Infection of skin    Iron deficiency anemia    Lower urinary tract symptoms    Meralgia paresthetica    Neck pain    Obesity    Post-void dribbling    Prostatitis    Sleep apnea    with CPAP   SNHL (sensorineural hearing loss)    Vertigo    occasional    Surgical History: Past Surgical History:  Procedure Laterality Date   Blood vessel Surgery     CARPAL TUNNEL RELEASE Bilateral 2011   CATARACT EXTRACTION W/PHACO Right 10/17/2017   Procedure: CATARACT EXTRACTION PHACO AND INTRAOCULAR LENS PLACEMENT ([place])  RIGHT DIABETIC;  Surgeon: Leandrew Koyanagi, MD;  Location: [place];  Service: Ophthalmology;  Laterality: Right;  CPAP Diet controlled diabetic   CATARACT EXTRACTION W/PHACO Left 11/23/2017   Procedure: CATARACT EXTRACTION PHACO AND INTRAOCULAR LENS PLACEMENT ([place])  LEFT DIABETIC;  Surgeon: Leandrew Koyanagi, MD;  Location: [place];  Service: Ophthalmology;  Laterality: Left;  Diabetic  sleep apnea   COLONOSCOPY WITH PROPOFOL N/A 07/11/2015   Procedure: COLONOSCOPY WITH PROPOFOL;  Surgeon: Lollie Sails, MD;  Location: [facility];   Service: Endoscopy;  Laterality: N/A;   COLONOSCOPY WITH PROPOFOL N/A 02/02/2019   Procedure: COLONOSCOPY WITH PROPOFOL;  Surgeon: Lollie Sails, MD;  Location: [facility]COPY;  Service: Endoscopy;  Laterality: N/A;   FOOT SURGERY Bilateral    FOOT SURGERY Left 6644   complicated by DVT   HEMORRHOID SURGERY  Panacea Bilateral 1985   x2   HOLEP-LASER ENUCLEATION OF THE PROSTATE WITH MORCELLATION  08/28/2014   Dr. Hollice Espy   HYDROCELE EXCISION / REPAIR  1985   x2   Hydrocelectomy     x 2   JOINT REPLACEMENT     left knee, right shoulder   LEG SURGERY Right 1974   fatty tumor   PROSTATE SURGERY     REPLACEMENT TOTAL KNEE Left 2012   SHOULDER SURGERY Right 1968   TONSILLECTOMY  1958   TOTAL SHOULDER ARTHROPLASTY Right 09/18/2014   arthroplasty, glenohumeral joint; total shoulder (glenoid and prosimal humeral replacement)   VASECTOMY  1979    Home Medications:  Allergies as of 10/22/2021       Reactions   Penicillins    Other reaction(s):unknown,as a child        Medication List        Accurate as of October 22, 2021  9:23 AM. If you have any questions, ask your nurse or doctor.          albuterol 108 (90 Base) MCG/ACT inhaler Commonly known as: VENTOLIN HFA Inhale into the lungs. Reported on 07/29/2015   cetirizine 10 MG chewable tablet Commonly known as: ZYRTEC Chew 10 mg by mouth daily.   Cholecalciferol 50 MCG (2000 UT) Caps Take 3 capsules daily for 3 months, then reduce to 1 capsule daily thereafter for Vitamin D Deficiency.   cyclobenzaprine 5 MG tablet Commonly known as: FLEXERIL   fluticasone 50 MCG/ACT nasal spray Commonly known as: FLONASE Reported on 07/29/2015   gabapentin 300 MG capsule Commonly known as: NEURONTIN Take 300 mg by mouth 4 (four) times daily.   HYDROcodone-acetaminophen 5-325 MG tablet Commonly known as: NORCO/VICODIN 5-325 tablets. 1/2 to 1 tablet 4 times a day   lisinopril-hydrochlorothiazide 10-12.5 MG  tablet Commonly known as: ZESTORETIC Take 1 tablet by mouth daily. am   MULTIVITAMIN ADULT PO Take by mouth.   pantoprazole 40 MG tablet Commonly known as: PROTONIX 40 mg 2 (two) times daily.   pravastatin 20 MG tablet Commonly known as: PRAVACHOL Take 20 mg by mouth daily.   psyllium 58.6 % packet Commonly known as: METAMUCIL Take 1 packet by mouth daily.   tadalafil 20 MG tablet Commonly known as: CIALIS Take 1 tablet (20 mg total) by mouth daily as needed for erectile dysfunction.   tamsulosin 0.4 MG Caps capsule Commonly known as: FLOMAX TAKE 1 CAPSULE EVERY DAY   torsemide 5 MG tablet Commonly known as: DEMADEX Take 5 mg by mouth daily.   warfarin 5 MG tablet Commonly known as: COUMADIN Take by mouth. 1/2 to 1 a day        Allergies:  Allergies  Allergen Reactions   Penicillins     Other reaction(s):unknown,as a child    Family History: Family History  Problem Relation Age of Onset   Thrombosis Other    Skin cancer Father    Kidney disease Neg Hx    Prostate cancer Neg Hx    Kidney cancer Neg Hx    Bladder Cancer Neg Hx     Social History:  reports that he has never smoked. He has never used smokeless tobacco. He reports that he does not drink alcohol and does not use drugs.   Physical Exam: BP 133/72   Pulse 65   Ht '6\' 1"'$  (1.854 m)   Wt 300 lb (136.1 kg)   BMI 39.58 kg/m   Constitutional:  Well  nourished. Alert and oriented, No acute distress. HEENT: [place] AT, moist mucus membranes.  Trachea midline Cardiovascular: No clubbing, cyanosis, or edema. Respiratory: Normal respiratory effort, no increased work of breathing. GU: No CVA tenderness.  No bladder fullness or masses.  Patient with uncircumcised phallus. Foreskin easily retracted  Urethral meatus is patent.  No penile discharge. No penile lesions or rashes. Scrotum without lesions, cysts, rashes and/or edema.  Testicles are located scrotally bilaterally. No masses are appreciated in the  testicles. Left and right epididymis are normal. Rectal: Patient with  normal sphincter tone. Anus and perineum without scarring or rashes. No rectal masses are appreciated. Prostate is approximately 60 + grams, flat, no nodules are appreciated. Seminal vesicles could not be palpated.   Neurologic: Grossly intact, no focal deficits, moving all 4 extremities. Psychiatric: Normal mood and affect.   Laboratory Data:  Hemoglobin  Latest Ref Rng 13.0 - 17.7 g/dL  10/20/2021 13.3    Component     Latest Ref Rng 10/20/2021  HCT     37.5 - 51.0 % 42.7    Component     Latest Ref Rng 10/20/2021  Prostate Specific Ag, Serum     0.0 - 4.0 ng/mL 0.5    Component     Latest Ref Rng 10/20/2021  Testosterone     264 - 916 ng/dL 570   I have reviewed the labs.    Pertinent Imaging: CLINICAL DATA:  Flank pain   EXAM: CT ABDOMEN AND PELVIS WITHOUT CONTRAST   TECHNIQUE: Multidetector CT imaging of the abdomen and pelvis was performed following the standard protocol without IV contrast.   RADIATION DOSE REDUCTION: This exam was performed according to the departmental dose-optimization program which includes automated exposure control, adjustment of the mA and/or kV according to patient size and/or use of iterative reconstruction technique.   COMPARISON:  CT abdomen and pelvis dated July 14, 2018   FINDINGS: Lower chest: No acute abnormality.   Hepatobiliary: Hepatic steatosis. Gallbladder is unremarkable. No Peri ductal dilation.   Pancreas: Unremarkable. No pancreatic ductal dilatation or surrounding inflammatory changes.   Spleen: Normal in size without focal abnormality.   Adrenals/Urinary Tract: Bilateral adrenal glands are unremarkable. No hydronephrosis or nephrolithiasis. Bilateral low-attenuation renal lesions, largest are compatible with simple cysts, others are too small to completely characterize, no further follow-up imaging is recommended for these lesions. No bladder  wall thickening.   Stomach/Bowel: Stomach is within normal limits. Appendix appears normal. No evidence of bowel wall thickening, distention, or inflammatory changes.   Vascular/Lymphatic: Aortic atherosclerosis. No enlarged abdominal or pelvic lymph nodes.   Reproductive: Prostate is unremarkable.   Other: Moderate right inguinal hernia containing fat and a portion of the bladder unchanged compared to prior exam. No abdominopelvic ascites.   Musculoskeletal: No acute or significant osseous findings.   IMPRESSION: 1. No acute findings in the abdomen or pelvis, including no evidence of obstructive uropathy. 2. Moderate right inguinal hernia containing fat and a small portion of the bladder, unchanged when compared to prior exam. 3. Hepatic steatosis. 4.  Aortic Atherosclerosis (ICD10-I70.0).     Electronically Signed   By: Yetta Glassman M.D.   On: 09/09/2021 11:20  I have independently reviewed the films.  See HPI.    Assessment & Plan:    1. Testosterone deficiency  -testosterone levels are therapeutic -H & H WNL -continue AVEED  2. BPH with LUTS -PSA stable -DRE benign -symptoms - split stream/urgency -continue conservative management, avoiding bladder irritants and timed  voiding's -Continue tamsulosin 0.4 mg daily  3. Erectile dysfunction:    - I explained that conditions like diabetes, hypertension, coronary artery disease, peripheral vascular disease, smoking, alcohol consumption, age, sleep apnea and BPH can diminish the ability to have an erection - A recent study published in Sex Med 2018 Apr 13 revealed moderate to vigorous aerobic exercise for 40 minutes 4 times per week can decrease erectile problems caused by physical inactivity, obesity, hypertension, metabolic syndrome and/or cardiovascular diseases -he states he is limited in physical activity due to shoulder and knee pain - We discussed trying a intra-urethral suppositories, intracavernous vasoactive  drug injection therapy, vacuum erection devices and penile prosthesis implantation  -He would like further information on ICI so that he may research it and he will contact me if interested    Return in about 10 weeks (around 12/31/2021) for AVEED injection .  Zara Council, PA-C   [facility] [address] [place], [place] [address] [phone]

## 2021-12-07 NOTE — Progress Notes (Unsigned)
Aveed Injection  Due to Hypogonadism patient is present today for a Aveed Injection.  Medication: Aveed Dose: '750mg'$ / 48m Lot:*** Exp:*** Location: {RIGHT/LEFT:20294} upper outer buttocks Injection of medication was given over a 60 second period of time.  Patient tolerated well, {dnt complications:20057} Patient remained in the office for 30 minutes post injection for monitoring and will report any side effects.  Performed by: ***  Additional notes/ Follow up: ***

## 2021-12-08 ENCOUNTER — Encounter: Payer: Self-pay | Admitting: Urology

## 2021-12-08 ENCOUNTER — Ambulatory Visit: Payer: Medicare Other | Admitting: Urology

## 2021-12-08 DIAGNOSIS — E349 Endocrine disorder, unspecified: Secondary | ICD-10-CM

## 2021-12-08 MED ORDER — TESTOSTERONE UNDECANOATE 750 MG/3ML IM SOLN: 750.0000 mg | Freq: Once | INTRAMUSCULAR | Status: DC

## 2021-12-16 ENCOUNTER — Ambulatory Visit (INDEPENDENT_AMBULATORY_CARE_PROVIDER_SITE_OTHER): Payer: Medicare Other | Admitting: Physician Assistant

## 2021-12-16 DIAGNOSIS — E291 Testicular hypofunction: Secondary | ICD-10-CM

## 2021-12-16 DIAGNOSIS — E349 Endocrine disorder, unspecified: Secondary | ICD-10-CM

## 2021-12-16 MED ORDER — TESTOSTERONE UNDECANOATE 750 MG/3ML IM SOLN
750.0000 mg | Freq: Once | INTRAMUSCULAR | Status: AC
  Administered 2021-12-16: 750 mg via INTRAMUSCULAR

## 2021-12-16 NOTE — Progress Notes (Signed)
Aveed Injection  Due to Hypogonadism patient is present today for a Aveed Injection.  Medication: Aveed Dose: '750mg'$ / 76m [id number] Exp:11/2024 Location: left upper outer buttocks Injection of medication was given over a 60 second period of time.  Patient tolerated well, no complications were noted Patient remained in the office for 30 minutes post injection for monitoring and will report any side effects.  Performed by: CBradly BienenstockCMA

## 2022-02-24 ENCOUNTER — Ambulatory Visit (INDEPENDENT_AMBULATORY_CARE_PROVIDER_SITE_OTHER): Payer: Medicare Other | Admitting: Physician Assistant

## 2022-02-24 DIAGNOSIS — E291 Testicular hypofunction: Secondary | ICD-10-CM

## 2022-02-24 DIAGNOSIS — E349 Endocrine disorder, unspecified: Secondary | ICD-10-CM

## 2022-02-24 MED ORDER — TESTOSTERONE UNDECANOATE 750 MG/3ML IM SOLN
Freq: Once | INTRAMUSCULAR | Status: AC
  Administered 2022-02-24: 750 mL via INTRAMUSCULAR

## 2022-02-24 NOTE — Progress Notes (Signed)
Aveed Injection  Due to Hypogonadism patient is present today for a Aveed Injection.  Medication: Aveed Dose: '750mg'$ / 25m [id number]:11/2024 Location: left upper outer buttocks Injection of medication was given over a 60 second period of time.  Patient tolerated well, no complications were noted Patient remained in the office for 30 minutes post injection for monitoring and will report any side effects.  Performed by: CBradly BienenstockCMA  Additional notes/ Follow up: Follow up in December for labs, OV and next Aveed injection.

## 2022-04-20 ENCOUNTER — Other Ambulatory Visit: Payer: Self-pay | Admitting: Family Medicine

## 2022-04-20 DIAGNOSIS — E291 Testicular hypofunction: Secondary | ICD-10-CM

## 2022-04-20 DIAGNOSIS — N138 Other obstructive and reflux uropathy: Secondary | ICD-10-CM

## 2022-04-21 ENCOUNTER — Other Ambulatory Visit: Payer: Medicare Other

## 2022-04-21 DIAGNOSIS — N138 Other obstructive and reflux uropathy: Secondary | ICD-10-CM

## 2022-04-21 DIAGNOSIS — E291 Testicular hypofunction: Secondary | ICD-10-CM

## 2022-04-22 LAB — HEMATOCRIT: Hematocrit: 46 % (ref 37.5–51.0)

## 2022-04-22 LAB — TESTOSTERONE: Testosterone: 360 ng/dL (ref 264–916)

## 2022-04-22 LAB — PSA: Prostate Specific Ag, Serum: 0.5 ng/mL (ref 0.0–4.0)

## 2022-04-22 LAB — HEMOGLOBIN: Hemoglobin: 13.9 g/dL (ref 13.0–17.7)

## 2022-04-26 NOTE — Progress Notes (Unsigned)
04/27/22 9:08 AM   Star Age Nov 20, 1950 [id number]  Referring provider:  Latanya Maudlin, NP [address] [place],  [name] [address]  Urological history  1.  Testosterone deficiency -contributing factors of age, obesity, diabetes and chronic pain medications -testosterone level (04/2022) 360 -HCT/hemoglobin (04/2022) 46.0/13.9 -managed with AVEED -last injection 02/24/2022   2. BPH with LU TS -PSA (04/2022) 0.5 -s/p HoLEP 2016 -I PSS 15/2 -tamsulosin 0.4 mg daily   3. ED -contributing factors of age, BPH, testosterone deficiency, DM, HTN, HLD, spinal injury, COPD, anticoagulation therapy and sleep apnea (sleeps with CPAP) -SHIM 5 -tadalafil 20 mg, on-demand dosing   5. Bilateral renal cysts -contrast CT 01/2021 - Bilateral renal cysts measuring up to 4 cm in the left mid kidney -stable on CT renal stone study 09/2021 - stable when compared to prior study    6. Bladder herniation -CT renal stone study 09/2021 - right inguinal hernia containing fat and small portion of the right anterior lateral bladder wall, unchanged when compared to prior CT in 2022   Chief Complaint  Patient presents with   Hypogonadism     HPI: Lance [name] is a 71 y.o.male who presents today for a 6 month follow-up with PSA, testosterone, and H&H labs prior and IPSS with exam.   He is having baseline urgent urination.  Patient denies any modifying or aggravating factors.  Patient denies any gross hematuria, dysuria or suprapubic/flank pain.  Patient denies any fevers, chills, nausea or vomiting.     IPSS     Row Name 04/27/22 0800         International Prostate Symptom Score   How often have you had the sensation of not emptying your bladder? Less than 1 in 5     How often have you had to urinate less than every two hours? About half the time     How often have you found you stopped and started again several times when you urinated? Less than 1 in 5 times     How often  have you found it difficult to postpone urination? About half the time     How often have you had a weak urinary stream? Less than half the time     How often have you had to strain to start urination? About half the time     How many times did you typically get up at night to urinate? 2 Times     Total IPSS Score 15       Quality of Life due to urinary symptoms   If you were to spend the rest of your life with your urinary condition just the way it is now how would you feel about that? Mostly Satisfied               Score:  1-7 Mild 8-19 Moderate 20-35 Severe  Patient is not having spontaneous erections.  He denies any pain or curvature with erections.  The tadalafil will be effective at times, but the side effects are intolerable.     SHIM     Row Name 04/27/22 0848         SHIM: Over the last 6 months:   How do you rate your confidence that you could get and keep an erection? Very Low     When you had erections with sexual stimulation, how often were your erections hard enough for penetration (entering your partner)? Almost Never or Never     During  sexual intercourse, how often were you able to maintain your erection after you had penetrated (entered) your partner? Almost Never or Never     During sexual intercourse, how difficult was it to maintain your erection to completion of intercourse? Extremely Difficult     When you attempted sexual intercourse, how often was it satisfactory for you? Almost Never or Never       SHIM Total Score   SHIM 5               Score: 1-7 Severe ED 8-11 Moderate ED 12-16 Mild-Moderate ED 17-21 Mild ED 22-25 No ED   PMH: Past Medical History:  Diagnosis Date   Acute cystitis    Allergic rhinitis    BPH (benign prostatic hyperplasia)    Bronchitis    chronic   Carpal tunnel syndrome    CHF (congestive heart failure) (HCC)    Complication of anesthesia    long time to wake after fatty tumor surgery   DDD (degenerative  disc disease), lumbar    whole body, joints   Degenerative lumbar spinal stenosis    chronic back pain, left leg and foot   Diabetes mellitus (HCC)    diet controlled   DISH (diffuse idiopathic skeletal hyperostosis)    neck pain   Dyspnea    with exertion, wheezing occasionally   Erectile dysfunction    GERD (gastroesophageal reflux disease)    Gout    Headache    migraines-occasionally   History of DVT (deep vein thrombosis)    left leg   History of migraine headaches    History of pulmonary embolism    bilateral   HOH (hard of hearing)    wears aids   Hypercholesterolemia    Hypercoagulable state ([place])    Hypertension    controlled on meds   Hypogonadism in male    Infection of skin    Iron deficiency anemia    Lower urinary tract symptoms    Meralgia paresthetica    Neck pain    Obesity    Post-void dribbling    Prostatitis    Sleep apnea    with CPAP   SNHL (sensorineural hearing loss)    Vertigo    occasional    Surgical History: Past Surgical History:  Procedure Laterality Date   Blood vessel Surgery     CARPAL TUNNEL RELEASE Bilateral 2011   CATARACT EXTRACTION W/PHACO Right 10/17/2017   Procedure: CATARACT EXTRACTION PHACO AND INTRAOCULAR LENS PLACEMENT ([place])  RIGHT DIABETIC;  Surgeon: Leandrew Koyanagi, MD;  Location: [facility];  Service: Ophthalmology;  Laterality: Right;  CPAP Diet controlled diabetic   CATARACT EXTRACTION W/PHACO Left 11/23/2017   Procedure: CATARACT EXTRACTION PHACO AND INTRAOCULAR LENS PLACEMENT ([place])  LEFT DIABETIC;  Surgeon: Leandrew Koyanagi, MD;  Location: [facility];  Service: Ophthalmology;  Laterality: Left;  Diabetic  sleep apnea   COLONOSCOPY WITH PROPOFOL N/A 07/11/2015   Procedure: COLONOSCOPY WITH PROPOFOL;  Surgeon: Lollie Sails, MD;  Location[facility] ENDOSCOPY;  Service: Endoscopy;  Laterality: N/A;   COLONOSCOPY WITH PROPOFOL N/A 02/02/2019   Procedure: COLONOSCOPY WITH PROPOFOL;  Surgeon:  Lollie Sails, MD;  Location: [facility] ENDOSCOPY;  Service: Endoscopy;  Laterality: N/A;   FOOT SURGERY Bilateral    FOOT SURGERY Left 0086   complicated by DVT   [place] Bilateral 1985   x2   HOLEP-LASER ENUCLEATION OF THE PROSTATE WITH MORCELLATION  08/28/2014   Dr.  Annetta South / REPAIR  1985   x2   Hydrocelectomy     x 2   JOINT REPLACEMENT     left knee, right shoulder   LEG SURGERY Right 1974   fatty tumor   PROSTATE SURGERY     REPLACEMENT TOTAL KNEE Left 2012   SHOULDER SURGERY Right 1968   TONSILLECTOMY  1958   TOTAL SHOULDER ARTHROPLASTY Right 09/18/2014   arthroplasty, glenohumeral joint; total shoulder (glenoid and prosimal humeral replacement)   VASECTOMY  1979    Home Medications:  Allergies as of 04/27/2022       Reactions   Penicillins    Other reaction(s):unknown,as a child        Medication List        Accurate as of April 27, 2022  9:08 AM. If you have any questions, ask your nurse or doctor.          albuterol 108 (90 Base) MCG/ACT inhaler Commonly known as: VENTOLIN HFA Inhale into the lungs. Reported on 07/29/2015   cetirizine 10 MG chewable tablet Commonly known as: ZYRTEC Chew 10 mg by mouth daily.   Cholecalciferol 50 MCG (2000 UT) Caps Take 3 capsules daily for 3 months, then reduce to 1 capsule daily thereafter for Vitamin D Deficiency.   cyclobenzaprine 5 MG tablet Commonly known as: FLEXERIL   fluticasone 50 MCG/ACT nasal spray Commonly known as: FLONASE Reported on 07/29/2015   gabapentin 300 MG capsule Commonly known as: NEURONTIN Take 300 mg by mouth 4 (four) times daily.   HYDROcodone-acetaminophen 5-325 MG tablet Commonly known as: NORCO/VICODIN 5-325 tablets. 1/2 to 1 tablet 4 times a day   lisinopril-hydrochlorothiazide 10-12.5 MG tablet Commonly known as: ZESTORETIC Take 1 tablet by mouth daily. am   MULTIVITAMIN ADULT PO Take by mouth.    pantoprazole 40 MG tablet Commonly known as: PROTONIX 40 mg 2 (two) times daily.   pravastatin 20 MG tablet Commonly known as: PRAVACHOL Take 20 mg by mouth daily.   psyllium 58.6 % packet Commonly known as: METAMUCIL Take 1 packet by mouth daily.   tadalafil 20 MG tablet Commonly known as: CIALIS Take 1 tablet (20 mg total) by mouth daily as needed for erectile dysfunction.   tamsulosin 0.4 MG Caps capsule Commonly known as: FLOMAX Take 1 capsule (0.4 mg total) by mouth daily.   torsemide 5 MG tablet Commonly known as: DEMADEX Take 5 mg by mouth daily.   warfarin 5 MG tablet Commonly known as: COUMADIN Take by mouth. 1/2 to 1 a day        Allergies:  Allergies  Allergen Reactions   Penicillins     Other reaction(s):unknown,as a child    Family History: Family History  Problem Relation Age of Onset   Thrombosis Other    Skin cancer Father    Kidney disease Neg Hx    Prostate cancer Neg Hx    Kidney cancer Neg Hx    Bladder Cancer Neg Hx     Social History:  reports that he has never smoked. He has never used smokeless tobacco. He reports that he does not drink alcohol and does not use drugs.   Physical Exam: BP 98/61   Pulse 93   Ht 6' (1.829 m)   Wt 286 lb (129.7 kg)   BMI 38.79 kg/m   Constitutional:  Well nourished. Alert and oriented, No acute distress. HEENT: [place] AT, moist mucus membranes.  Trachea midline Cardiovascular: No clubbing, cyanosis, or  edema. Respiratory: Normal respiratory effort, no increased work of breathing. Neurologic: Grossly intact, no focal deficits, moving all 4 extremities. Psychiatric: Normal mood and affect.   Laboratory Data: See urological history and EPIC.  Serum creatinine (12/2021) 1.1 Hemoglobin A1c (12/2021) 7.0 I have reviewed the labs.    Pertinent Imaging: N/A    Assessment & Plan:    1. Testosterone deficiency  -testosterone levels are sub therapeutic  -HCT/hemoglobin (04/2022)  46.0/13.9 -continue AVEED  2. BPH with LUTS -PSA stable -symptoms - split stream/urgency -continue conservative management, avoiding bladder irritants and timed voiding's -tamsulosin 0.4 mg daily  3. Erectile dysfunction:    - intolerable side effects w/ PDE5i's     Return in about 6 months (around 10/27/2022) for PSA, testosterone, H & H, IPSS, SHIM and exam.  Zara Council, [place] [address] [place], [place] [address] [phone]

## 2022-04-27 ENCOUNTER — Encounter: Payer: Self-pay | Admitting: Urology

## 2022-04-27 ENCOUNTER — Ambulatory Visit (INDEPENDENT_AMBULATORY_CARE_PROVIDER_SITE_OTHER): Payer: Medicare Other | Admitting: Urology

## 2022-04-27 VITALS — BP 98/61 | HR 93 | Ht 72.0 in | Wt 286.0 lb

## 2022-04-27 DIAGNOSIS — N529 Male erectile dysfunction, unspecified: Secondary | ICD-10-CM | POA: Diagnosis not present

## 2022-04-27 DIAGNOSIS — N401 Enlarged prostate with lower urinary tract symptoms: Secondary | ICD-10-CM

## 2022-04-27 DIAGNOSIS — N138 Other obstructive and reflux uropathy: Secondary | ICD-10-CM

## 2022-04-27 DIAGNOSIS — E291 Testicular hypofunction: Secondary | ICD-10-CM

## 2022-04-27 DIAGNOSIS — E349 Endocrine disorder, unspecified: Secondary | ICD-10-CM

## 2022-04-27 MED ORDER — TADALAFIL 20 MG PO TABS: 20.0000 mg | ORAL_TABLET | Freq: Every day | ORAL | 3 refills | Status: DC | PRN

## 2022-04-27 MED ORDER — TAMSULOSIN HCL 0.4 MG PO CAPS: 0.4000 mg | ORAL_CAPSULE | Freq: Every day | ORAL | 3 refills | Status: DC

## 2022-04-29 ENCOUNTER — Other Ambulatory Visit (INDEPENDENT_AMBULATORY_CARE_PROVIDER_SITE_OTHER): Payer: Self-pay | Admitting: Nurse Practitioner

## 2022-04-29 DIAGNOSIS — I739 Peripheral vascular disease, unspecified: Secondary | ICD-10-CM

## 2022-04-30 ENCOUNTER — Encounter (INDEPENDENT_AMBULATORY_CARE_PROVIDER_SITE_OTHER): Payer: Self-pay | Admitting: Nurse Practitioner

## 2022-04-30 ENCOUNTER — Ambulatory Visit (INDEPENDENT_AMBULATORY_CARE_PROVIDER_SITE_OTHER): Payer: Medicare Other

## 2022-04-30 ENCOUNTER — Ambulatory Visit (INDEPENDENT_AMBULATORY_CARE_PROVIDER_SITE_OTHER): Payer: Medicare Other | Admitting: Nurse Practitioner

## 2022-04-30 VITALS — BP 122/73 | HR 80 | Resp 16 | Ht 72.0 in | Wt 294.0 lb

## 2022-04-30 DIAGNOSIS — I739 Peripheral vascular disease, unspecified: Secondary | ICD-10-CM

## 2022-04-30 DIAGNOSIS — M5136 Other intervertebral disc degeneration, lumbar region: Secondary | ICD-10-CM | POA: Diagnosis not present

## 2022-05-07 ENCOUNTER — Encounter: Payer: Self-pay | Admitting: Physician Assistant

## 2022-05-07 ENCOUNTER — Ambulatory Visit (INDEPENDENT_AMBULATORY_CARE_PROVIDER_SITE_OTHER): Payer: Medicare Other | Admitting: Physician Assistant

## 2022-05-07 DIAGNOSIS — E291 Testicular hypofunction: Secondary | ICD-10-CM | POA: Diagnosis not present

## 2022-05-07 DIAGNOSIS — E349 Endocrine disorder, unspecified: Secondary | ICD-10-CM

## 2022-05-07 MED ORDER — TESTOSTERONE UNDECANOATE 750 MG/3ML IM SOLN
750.0000 mg | Freq: Once | INTRAMUSCULAR | Status: AC
  Administered 2022-05-07: 750 mg via INTRAMUSCULAR

## 2022-05-07 NOTE — Progress Notes (Signed)
Aveed Injection  Due to Hypogonadism patient is present today for a Aveed Injection.  Medication: Aveed Dose: '750mg'$ / 39m LVSY:[id number]:11/2024 Location: right upper outer buttocks Injection of medication was given over a 60 second period of time.  Patient tolerated well, no complications were noted. Patient remained in the office for 30 minutes post injection for monitoring and will report any side effects.  Performed by: OGordy Clement CMA   Additional notes/ Follow up: RTC in 10 wks for injection

## 2022-05-17 ENCOUNTER — Encounter (INDEPENDENT_AMBULATORY_CARE_PROVIDER_SITE_OTHER): Payer: Self-pay | Admitting: Nurse Practitioner

## 2022-05-17 NOTE — Progress Notes (Signed)
Subjective:    Patient ID: Lance [name], male    DOB: [birth year], 72 y.o.   MRN: [id number] Chief Complaint  Patient presents with   New Patient (Initial Visit)    Consult for ABI, DVT and claudication    Lance [name] is a 72 year old male who is referred by Reche Dixon, PA-C with concern for possible vascular involvement with claudication.  The patient has had this pain for approximately 1 year.  It occurs in his left leg and he has had 2 foot surgeries and a knee replacement.  He also had a DVT with his foot surgery as well.  The pain is in the patient's left leg from the knee up.  He also has pain in the right groin when he lifts his leg.  He also has known hip issues as well as lower back issues.  He denies any classic rest pain like symptoms.  He has known lymphedema and currently utilizes a lymphedema pump to help with his leg swelling.  Currently there is no open wounds or ulcerations.  Today the patient has ABI 1.19 on the right and 1.13 on the left.  Normal TBI's bilaterally.  He has strong triphasic tibial artery waveforms bilaterally with normal toe waveforms bilaterally.    Review of Systems  Cardiovascular:  Positive for leg swelling.  Musculoskeletal:  Positive for arthralgias.  All other systems reviewed and are negative.      Objective:   Physical Exam Vitals reviewed.  HENT:     Head: Normocephalic.  Cardiovascular:     Rate and Rhythm: Normal rate.     Pulses: Normal pulses.  Pulmonary:     Effort: Pulmonary effort is normal.  Skin:    General: Skin is warm and dry.  Neurological:     Mental Status: He is alert and oriented to person, place, and time.  Psychiatric:        Mood and Affect: Mood normal.        Behavior: Behavior normal.        Thought Content: Thought content normal.        Judgment: Judgment normal.     BP 122/73 (BP Location: Left Arm)   Pulse 80   Resp 16   Ht 6' (1.829 m)   Wt 294 lb (133.4 kg)   BMI 39.87 kg/m   Past  Medical History:  Diagnosis Date   Acute cystitis    Allergic rhinitis    BPH (benign prostatic hyperplasia)    Bronchitis    chronic   Carpal tunnel syndrome    CHF (congestive heart failure) (HCC)    Complication of anesthesia    long time to wake after fatty tumor surgery   DDD (degenerative disc disease), lumbar    whole body, joints   Degenerative lumbar spinal stenosis    chronic back pain, left leg and foot   Diabetes mellitus (HCC)    diet controlled   DISH (diffuse idiopathic skeletal hyperostosis)    neck pain   Dyspnea    with exertion, wheezing occasionally   Erectile dysfunction    GERD (gastroesophageal reflux disease)    Gout    Headache    migraines-occasionally   History of DVT (deep vein thrombosis)    left leg   History of migraine headaches    History of pulmonary embolism    bilateral   HOH (hard of hearing)    wears aids   Hypercholesterolemia    Hypercoagulable state ([place])  Hypertension    controlled on meds   Hypogonadism in male    Infection of skin    Iron deficiency anemia    Lower urinary tract symptoms    Meralgia paresthetica    Neck pain    Obesity    Post-void dribbling    Prostatitis    Sleep apnea    with CPAP   SNHL (sensorineural hearing loss)    Vertigo    occasional    Social History   Socioeconomic History   Marital status: Married    Spouse name: Not on file   Number of children: Not on file   Years of education: Not on file   Highest education level: Not on file  Occupational History   Not on file  Tobacco Use   Smoking status: Never   Smokeless tobacco: Never  Vaping Use   Vaping Use: Never used  Substance and Sexual Activity   Alcohol use: No    Alcohol/week: 0.0 standard drinks of alcohol   Drug use: Never   Sexual activity: Not on file  Other Topics Concern   Not on file  Social History Narrative   Not on file   Social Determinants of Health   Financial Resource Strain: Not on file  Food  Insecurity: Not on file  Transportation Needs: Not on file  Physical Activity: Not on file  Stress: Not on file  Social Connections: Not on file  Intimate Partner Violence: Not on file    Past Surgical History:  Procedure Laterality Date   Blood vessel Surgery     CARPAL TUNNEL RELEASE Bilateral 2011   CATARACT EXTRACTION W/PHACO Right 10/17/2017   Procedure: CATARACT EXTRACTION PHACO AND INTRAOCULAR LENS PLACEMENT ([place])  RIGHT DIABETIC;  Surgeon: Leandrew Koyanagi, MD;  Location: [facility];  Service: Ophthalmology;  Laterality: Right;  CPAP Diet controlled diabetic   CATARACT EXTRACTION W/PHACO Left 11/23/2017   Procedure: CATARACT EXTRACTION PHACO AND INTRAOCULAR LENS PLACEMENT ([place])  LEFT DIABETIC;  Surgeon: Leandrew Koyanagi, MD;  Location: [facility];  Service: Ophthalmology;  Laterality: Left;  Diabetic  sleep apnea   COLONOSCOPY WITH PROPOFOL N/A 07/11/2015   Procedure: COLONOSCOPY WITH PROPOFOL;  Surgeon: Lollie Sails, MD;  Location: [facility] ENDOSCOPY;  Service: Endoscopy;  Laterality: N/A;   COLONOSCOPY WITH PROPOFOL N/A 02/02/2019   Procedure: COLONOSCOPY WITH PROPOFOL;  Surgeon: Lollie Sails, MD;  Location: [facility] ENDOSCOPY;  Service: Endoscopy;  Laterality: N/A;   FOOT SURGERY Bilateral    FOOT SURGERY Left 1478   complicated by DVT   [place] Bilateral 1985   x2   [place] WITH MORCELLATION  08/28/2014   Dr. Hollice Espy   HYDROCELE EXCISION / REPAIR  1985   x2   Hydrocelectomy     x 2   JOINT REPLACEMENT     left knee, right shoulder   LEG SURGERY Right 1974   fatty tumor   PROSTATE SURGERY     REPLACEMENT TOTAL KNEE Left 2012   SHOULDER SURGERY Right 1968   TONSILLECTOMY  1958   TOTAL SHOULDER ARTHROPLASTY Right 09/18/2014   arthroplasty, glenohumeral joint; total shoulder (glenoid and prosimal humeral replacement)   VASECTOMY  1979    Family History  Problem  Relation Age of Onset   Thrombosis Other    Skin cancer Father    Kidney disease Neg Hx    Prostate cancer Neg Hx    Kidney cancer Neg  Hx    Bladder Cancer Neg Hx     Allergies  Allergen Reactions   Penicillins     Other reaction(s):unknown,as a child       Latest Ref Rng & Units 04/21/2022    8:31 AM 10/20/2021    8:47 AM 09/09/2021    9:43 AM  CBC  Hemoglobin 13.0 - 17.7 g/dL 13.9  13.3  14.1   Hematocrit 37.5 - 51.0 % 46.0  42.7  44.9       CMP     Component Value Date/Time   NA 136 08/19/2014 1320   K 3.8 08/19/2014 1320   CL 103 08/19/2014 1320   CO2 28 08/19/2014 1320   GLUCOSE 92 08/19/2014 1320   BUN 14 08/19/2014 1320   CREATININE 0.89 08/19/2014 1320   CALCIUM 9.1 08/19/2014 1320   PROT 6.9 04/10/2015 0826   ALBUMIN 3.6 04/10/2015 0826   AST 26 04/10/2015 0826   ALT 29 04/10/2015 0826   ALKPHOS 79 04/10/2015 0826   BILITOT 0.4 04/10/2015 0826   GFRNONAA >60 08/19/2014 1320   GFRAA >60 08/19/2014 1320     VAS [place] ABI WITH/WO TBI  Result Date: 05/06/2022  LOWER EXTREMITY DOPPLER STUDY Patient Name:  Lance [name]  Date of Exam:   04/30/2022 Medical Rec #: [id number]           Accession #:    [id number] Date of Birth: 14-Jun-1950           Patient Gender: M Patient Age:   20 years Exam Location:  [facility] Procedure:      VAS [place] ABI WITH/WO TBI Referring Phys: Leotis Pain --------------------------------------------------------------------------------  Indications: Claudication.  Performing Technologist: Almira Coaster RVS  Examination Guidelines: A complete evaluation includes at minimum, Doppler waveform signals and systolic blood pressure reading at the level of bilateral brachial, anterior tibial, and posterior tibial arteries, when vessel segments are accessible. Bilateral testing is considered an integral part of a complete examination. Photoelectric Plethysmograph (PPG) waveforms and toe systolic pressure readings are included as required  and additional duplex testing as needed. Limited examinations for reoccurring indications may be performed as noted.  ABI Findings: +---------+------------------+-----+---------+--------+ Right    Rt Pressure (mmHg)IndexWaveform Comment  +---------+------------------+-----+---------+--------+ Brachial 138                                      +---------+------------------+-----+---------+--------+ ATA      157               1.09 triphasic         +---------+------------------+-----+---------+--------+ PTA      172               1.19 triphasic         +---------+------------------+-----+---------+--------+ Great Toe144               1.00 Normal            +---------+------------------+-----+---------+--------+ +---------+------------------+-----+---------+-------+ Left     Lt Pressure (mmHg)IndexWaveform Comment +---------+------------------+-----+---------+-------+ Brachial 144                                     +---------+------------------+-----+---------+-------+ ATA      151               1.05 triphasic        +---------+------------------+-----+---------+-------+  PTA      163               1.13 triphasic        +---------+------------------+-----+---------+-------+ Great Toe156               1.08 Normal           +---------+------------------+-----+---------+-------+ +-------+-----------+-----------+------------+------------+ ABI/TBIToday's ABIToday's TBIPrevious ABIPrevious TBI +-------+-----------+-----------+------------+------------+ Right  1.19       1.0                                 +-------+-----------+-----------+------------+------------+ Left   1.13       1.08                                +-------+-----------+-----------+------------+------------+  Summary: Right: Resting right ankle-brachial index is within normal range. The right toe-brachial index is normal. Left: Resting left ankle-brachial index is within normal  range. The left toe-brachial index is normal. *See table(s) above for measurements and observations.  Electronically signed by Leotis Pain MD on 05/06/2022 at 9:56:36 AM.    Final        Assessment & Plan:   1. Claudication [facility]) Recommend:  The patient has atypical pain symptoms for vascular disease and on exam I do not find evidence of vascular pathology that would explain the patient's symptoms.  Noninvasive studies do not identify significant vascular problems  I suspect the patient is c/o pseudoclaudication.  Patient should have an evaluation of the LS spine which I defer to the primary service or the Spine service.  The patient should continue walking and begin a more formal exercise program. The patient should continue his antiplatelet therapy and aggressive treatment of the lipid abnormalities.  Patient will follow-up with me on a PRN basis. - VAS [place] ABI WITH/WO TBI  2. DDD (degenerative disc disease), lumbar Given the patient's hip issues.  I did discuss with the patient that should he ever need a hip or other orthopedic surgery he will likely need an IVC filter given his history of DVTs.   Current Outpatient Medications on File Prior to Visit  Medication Sig Dispense Refill   albuterol (VENTOLIN HFA) 108 (90 Base) MCG/ACT inhaler Inhale into the lungs. Reported on 07/29/2015     cetirizine (ZYRTEC) 10 MG chewable tablet Chew 10 mg by mouth daily.     Cholecalciferol 50 MCG (2000 UT) CAPS Take 3 capsules daily for 3 months, then reduce to 1 capsule daily thereafter for Vitamin D Deficiency.     cyclobenzaprine (FLEXERIL) 5 MG tablet      DULoxetine (CYMBALTA) 20 MG capsule Take by mouth.     fluticasone (FLONASE) 50 MCG/ACT nasal spray Reported on 07/29/2015     gabapentin (NEURONTIN) 300 MG capsule Take 300 mg by mouth 4 (four) times daily.      HYDROcodone-acetaminophen (NORCO/VICODIN) 5-325 MG per tablet 5-325 tablets. 1/2 to 1 tablet 4 times a day      lisinopril-hydrochlorothiazide (PRINZIDE,ZESTORETIC) 10-12.5 MG tablet Take 1 tablet by mouth daily. am     pantoprazole (PROTONIX) 40 MG tablet 40 mg 2 (two) times daily.      pravastatin (PRAVACHOL) 20 MG tablet Take 20 mg by mouth daily.     psyllium (METAMUCIL) 58.6 % packet Take 1 packet by mouth daily.     tamsulosin (FLOMAX) 0.4 MG CAPS capsule Take 1  capsule (0.4 mg total) by mouth daily. 90 capsule 3   torsemide (DEMADEX) 5 MG tablet Take 5 mg by mouth daily.     warfarin (COUMADIN) 5 MG tablet Take by mouth. 1/2 to 1 a day     Multiple Vitamins-Minerals (MULTIVITAMIN ADULT PO) Take by mouth.     tadalafil (CIALIS) 20 MG tablet Take 1 tablet (20 mg total) by mouth daily as needed for erectile dysfunction. 30 tablet 3   No current facility-administered medications on file prior to visit.    There are no Patient Instructions on file for this visit. No follow-ups on file.   Kris Hartmann, NP

## 2022-07-23 ENCOUNTER — Ambulatory Visit: Payer: Medicare Other | Admitting: Physician Assistant

## 2022-07-29 ENCOUNTER — Ambulatory Visit (INDEPENDENT_AMBULATORY_CARE_PROVIDER_SITE_OTHER): Payer: Medicare Other | Admitting: Physician Assistant

## 2022-07-29 DIAGNOSIS — E291 Testicular hypofunction: Secondary | ICD-10-CM | POA: Diagnosis not present

## 2022-07-29 MED ORDER — TESTOSTERONE UNDECANOATE 750 MG/3ML IM SOLN
750.0000 mg | Freq: Once | INTRAMUSCULAR | Status: AC
  Administered 2022-07-29: 750 mg via INTRAMUSCULAR

## 2022-07-29 NOTE — Progress Notes (Signed)
Aveed Injection  Due to Hypogonadism patient is present today for a Aveed Injection.  Medication: Aveed Dose: 750mg / 64ml QI:[id number]  Exp:04/2025 Location: left upper outer buttocks Injection of medication was given over a 60 second period of time.  Patient tolerated well, no complications were noted Patient remained in the office for 30 minutes post injection for monitoring and will report any side effects.  Performed by: Gordy Clement, CMA   Additional notes/ Follow up: RTC as scheduled in 10wks.

## 2022-09-15 ENCOUNTER — Ambulatory Visit: Payer: Self-pay | Admitting: Surgery

## 2022-09-15 DIAGNOSIS — K4091 Unilateral inguinal hernia, without obstruction or gangrene, recurrent: Secondary | ICD-10-CM

## 2022-09-15 NOTE — H&P (Signed)
Subjective:   CC: Unilateral recurrent inguinal hernia without obstruction or gangrene [K40.91]  HPI:  Lance [name] is a 72 y.o. male who was referred by Self for evaluation of above. Symptoms were first noted 4 years ago. Not an issue at that time, but recently causing more pain, intermittent.  Associated with pain down his leg, exacerbated by nothing.  Lump is reducible.    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), DDD (degenerative disc disease), lumbar, Deficiency, protein S (CMS/HHS-HCC), Degenerative lumbar spinal stenosis, Diabetes mellitus (CMS/HHS-HCC), DVT (deep venous thrombosis) (CMS/HHS-HCC) (2003), GERD (gastroesophageal reflux disease), Gout, History of iron deficiency anemia, History of migraine headaches, History of pulmonary embolism, Hypercholesterolemia, Hypercoagulable state (CMS/HHS-HCC), Hypertension, Hypogonadism male, 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  Past Surgical History:  Past Surgical History:  Procedure Laterality Date   TONSILLECTOMY  1958   Pin in foot, ear surgery  1965   SHOULDER SURGERY Right 1968   fatty tumor (R) leg  1974   VASECTOMY  1979   HEMORRHOID SURGERY  1984   HERNIA REPAIR  1985   x2   HYDROCELE EXCISION / REPAIR  1985   x2   FOOT SURGERY Left 2003   complicated by DVT   Bilateral carpal tunnel release  2011   holmium laser enucleation of prostate  08/28/2014   Dr. Vanna Scotland   ARTHROPLASTY TOTAL SHOULDER Right 09/18/2014    Procedure: ARTHROPLASTY, GLENOHUMERAL JOINT; TOTAL SHOULDER (GLENOID AND PROXIMAL HUMERAL REPLACEMENT (EG, TOTAL SHOULDER);  Surgeon: Margaretha Sheffield, MD;  Location: [facility] OR;  Service: Orthopedics;  Laterality: Right;   COLONOSCOPY  07/14/2015   Tubular adenoma/Serrated adenoma of colon/Repeat 84yrs/MUS   EXTRACTION CATARACT EXTRACAPSULAR W/INSERTION INTRAOCULAR PROSTHESIS Right 10/17/2017   Dr. Inez Pilgrim   COLONOSCOPY  02/02/2019   Tubular adenoma of the colon/Hyperplastic colon polyp/Repeat 73yrs/MUS   REVISION TOTAL SHOULDER ARTHROPLASTY Right 05/16/2019   Procedure: REVISION SHOULDER ARTHROPLASTY;  Surgeon: Margaretha Sheffield, MD;  Location: [facility] OR;  Service: Orthopedics;  Laterality: Right;   OSTEOTOMY CLAVICLE Right 04/09/2020   Procedure: HUMERAL HEAD EXCHANGE, GLENOID/SHOULDER DEBRIDEMENT RIGHT SHOULDER;  Surgeon: Margaretha Sheffield, MD;  Location: [facility] OR;  Service: Orthopedics;  Laterality: Right;   EYE SURGERY     FRACTURE SURGERY     right shoulder repair   JOINT REPLACEMENT     left total knee replacement Dr. Sherin Quarry    Family History: family history includes Arthritis in his mother; Asthma in his mother; Clotting disorder in his father; Rheum arthritis in his mother; Skin cancer in his father; Stroke in his father.  Social History:  reports that he has never smoked. He has never used smokeless tobacco. He reports that he does not drink alcohol and does not use drugs.  Current Medications: has a current medication list which includes the following prescription(s): albuterol mdi (proventil, ventolin, proair) hfa, cetirizine, cholecalciferol, cyanocobalamin, duloxetine, gabapentin, gabapentin, hydrocodone-acetaminophen, lisinopril-hydrochlorothiazide, metformin, pantoprazole, pravastatin, propranolol, tadalafil, tamsulosin, torsemide, warfarin, cyclobenzaprine, fluticasone propionate, lisinopril, multivitamin, onetouch delica lancets, psyllium  (aspartame), tadalafil, testosterone, and triamcinolone.  Allergies:  Allergies as of 09/15/2022 - Reviewed 09/15/2022  Allergen Reaction Noted   Penicillins  Other (See Comments) and Rash 07/30/2011    ROS:  A 15 point review of systems was performed and pertinent positives and negatives noted in HPI   Objective:     BP 137/73   Pulse 67   Ht 185.4 cm (6' 0.99")   Wt (!) 137.4 kg (302 lb 14.6 oz)   BMI 39.97 kg/m   Constitutional :  Alert, cooperative, no distress  Lymphatics/Throat:  Supple, no lymphadenopathy  Respiratory:  clear to auscultation bilaterally  Cardiovascular:  regular rate and rhythm  Gastrointestinal: soft, non-tender; bowel sounds normal; no masses,  no organomegaly. inguinal hernia noted.  large, reducible, no overlying skin changes, and RIGHT  Musculoskeletal: Steady gait and movement  Skin: Cool and moist  Psychiatric: Normal affect, non-agitated, not confused       LABS:  N/A   RADS: N/A Assessment:       Unilateral recurrent inguinal hernia without obstruction or gangrene [K40.91]  Plan:     1. Unilateral recurrent inguinal hernia without obstruction or gangrene [K40.91]   Discussed the risk of surgery including recurrence, which can be up to 50% in the case of incisional or complex hernias, possible use of prosthetic materials (mesh) and the increased risk of mesh infxn if used, bleeding, chronic pain, post-op infxn, post-op SBO or ileus, and possible re-operation to address said risks. The risks of general anesthetic, if used, includes MI, CVA, sudden death or even reaction to anesthetic medications also discussed. Alternatives include continued observation.  Benefits include possible symptom relief, prevention of incarceration, strangulation, enlargement in size over time, and the risk of emergency surgery in the face of strangulation.   Typical post-op recovery time of 3-5 days with 2 weeks of activity restrictions were also discussed.  ED  return precautions given for sudden increase in pain, size of hernia with accompanying fever, nausea, and/or vomiting.  The patient verbalized understanding and all questions were answered to the patient's satisfaction.   2. Patient has elected to proceed with surgical treatment. Procedure will be scheduled. right, robotic assisted laparoscopic. Foley to decompress bladder that is involved in hernia per CT read from several years ago   Coumadin management per Carollee Herter, NP Will also obtain risk stratification as well.  labs/images/medications/previous chart entries reviewed personally and relevant changes/updates noted above.

## 2022-09-15 NOTE — H&P (View-Only) (Signed)
Subjective:   CC: Unilateral recurrent inguinal hernia without obstruction or gangrene [K40.91]  HPI:  Lance [name] is a 72 y.o. male who was referred by Self for evaluation of above. Symptoms were first noted 4 years ago. Not an issue at that time, but recently causing more pain, intermittent.  Associated with pain down his leg, exacerbated by nothing.  Lump is reducible.    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), DDD (degenerative disc disease), lumbar, Deficiency, protein S (CMS/HHS-HCC), Degenerative lumbar spinal stenosis, Diabetes mellitus (CMS/HHS-HCC), DVT (deep venous thrombosis) (CMS/HHS-HCC) (2003), GERD (gastroesophageal reflux disease), Gout, History of iron deficiency anemia, History of migraine headaches, History of pulmonary embolism, Hypercholesterolemia, Hypercoagulable state (CMS/HHS-HCC), Hypertension, Hypogonadism male, 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  Past Surgical History:  Past Surgical History:  Procedure Laterality Date   TONSILLECTOMY  1958   Pin in foot, ear surgery  1965   SHOULDER SURGERY Right 1968   fatty tumor (R) leg  1974   VASECTOMY  1979   HEMORRHOID SURGERY  1984   HERNIA REPAIR  1985   x2   HYDROCELE EXCISION / REPAIR  1985   x2   FOOT SURGERY Left 2003   complicated by DVT   Bilateral carpal tunnel release  2011   holmium laser enucleation of prostate  08/28/2014   Dr. Ashley Brandon   ARTHROPLASTY TOTAL SHOULDER Right 09/18/2014    Procedure: ARTHROPLASTY, GLENOHUMERAL JOINT; TOTAL SHOULDER (GLENOID AND PROXIMAL HUMERAL REPLACEMENT (EG, TOTAL SHOULDER);  Surgeon: Louis Cornelius Almekinders, MD;  Location: [facility] OR;  Service: Orthopedics;  Laterality: Right;   COLONOSCOPY  07/14/2015   Tubular adenoma/Serrated adenoma of colon/Repeat 2yrs/MUS   EXTRACTION CATARACT EXTRACAPSULAR W/INSERTION INTRAOCULAR PROSTHESIS Right 10/17/2017   Dr. Brasington   COLONOSCOPY  02/02/2019   Tubular adenoma of the colon/Hyperplastic colon polyp/Repeat 4yrs/MUS   REVISION TOTAL SHOULDER ARTHROPLASTY Right 05/16/2019   Procedure: REVISION SHOULDER ARTHROPLASTY;  Surgeon: Almekinders, Louis Cornelius, MD;  Location: [facility] OR;  Service: Orthopedics;  Laterality: Right;   OSTEOTOMY CLAVICLE Right 04/09/2020   Procedure: HUMERAL HEAD EXCHANGE, GLENOID/SHOULDER DEBRIDEMENT RIGHT SHOULDER;  Surgeon: Almekinders, Louis Cornelius, MD;  Location: [facility] OR;  Service: Orthopedics;  Laterality: Right;   EYE SURGERY     FRACTURE SURGERY     right shoulder repair   JOINT REPLACEMENT     left total knee replacement Dr. Scott Kelly    Family History: family history includes Arthritis in his mother; Asthma in his mother; Clotting disorder in his father; Rheum arthritis in his mother; Skin cancer in his father; Stroke in his father.  Social History:  reports that he has never smoked. He has never used smokeless tobacco. He reports that he does not drink alcohol and does not use drugs.  Current Medications: has a current medication list which includes the following prescription(s): albuterol mdi (proventil, ventolin, proair) hfa, cetirizine, cholecalciferol, cyanocobalamin, duloxetine, gabapentin, gabapentin, hydrocodone-acetaminophen, lisinopril-hydrochlorothiazide, metformin, pantoprazole, pravastatin, propranolol, tadalafil, tamsulosin, torsemide, warfarin, cyclobenzaprine, fluticasone propionate, lisinopril, multivitamin, onetouch delica lancets, psyllium  (aspartame), tadalafil, testosterone, and triamcinolone.  Allergies:  Allergies as of 09/15/2022 - Reviewed 09/15/2022  Allergen Reaction Noted   Penicillins   Other (See Comments) and Rash 07/30/2011    ROS:  A 15 point review of systems was performed and pertinent positives and negatives noted in HPI   Objective:     BP 137/73   Pulse 67   Ht 185.4 cm (6' 0.99")   Wt (!) 137.4 kg (302 lb 14.6 oz)   BMI 39.97 kg/m   Constitutional :  Alert, cooperative, no distress  Lymphatics/Throat:  Supple, no lymphadenopathy  Respiratory:  clear to auscultation bilaterally  Cardiovascular:  regular rate and rhythm  Gastrointestinal: soft, non-tender; bowel sounds normal; no masses,  no organomegaly. inguinal hernia noted.  large, reducible, no overlying skin changes, and RIGHT  Musculoskeletal: Steady gait and movement  Skin: Cool and moist  Psychiatric: Normal affect, non-agitated, not confused       LABS:  N/A   RADS: N/A Assessment:       Unilateral recurrent inguinal hernia without obstruction or gangrene [K40.91]  Plan:     1. Unilateral recurrent inguinal hernia without obstruction or gangrene [K40.91]   Discussed the risk of surgery including recurrence, which can be up to 50% in the case of incisional or complex hernias, possible use of prosthetic materials (mesh) and the increased risk of mesh infxn if used, bleeding, chronic pain, post-op infxn, post-op SBO or ileus, and possible re-operation to address said risks. The risks of general anesthetic, if used, includes MI, CVA, sudden death or even reaction to anesthetic medications also discussed. Alternatives include continued observation.  Benefits include possible symptom relief, prevention of incarceration, strangulation, enlargement in size over time, and the risk of emergency surgery in the face of strangulation.   Typical post-op recovery time of 3-5 days with 2 weeks of activity restrictions were also discussed.  ED  return precautions given for sudden increase in pain, size of hernia with accompanying fever, nausea, and/or vomiting.  The patient verbalized understanding and all questions were answered to the patient's satisfaction.   2. Patient has elected to proceed with surgical treatment. Procedure will be scheduled. right, robotic assisted laparoscopic. Foley to decompress bladder that is involved in hernia per CT read from several years ago   Coumadin management per Shannon, NP Will also obtain risk stratification as well.  labs/images/medications/previous chart entries reviewed personally and relevant changes/updates noted above.   

## 2022-09-22 ENCOUNTER — Encounter
Admission: RE | Admit: 2022-09-22 | Discharge: 2022-09-22 | Disposition: A | Discharge status: Home / Self Care | Payer: Medicare Other | Source: Ambulatory Visit | Attending: Surgery | Admitting: Surgery

## 2022-09-22 VITALS — BP 127/55 | HR 63 | Resp 16 | Ht 72.0 in | Wt 308.4 lb

## 2022-09-22 DIAGNOSIS — E1169 Type 2 diabetes mellitus with other specified complication: Secondary | ICD-10-CM | POA: Insufficient documentation

## 2022-09-22 DIAGNOSIS — I10 Essential (primary) hypertension: Secondary | ICD-10-CM | POA: Diagnosis not present

## 2022-09-22 DIAGNOSIS — Z01810 Encounter for preprocedural cardiovascular examination: Secondary | ICD-10-CM

## 2022-09-22 DIAGNOSIS — Z01818 Encounter for other preprocedural examination: Secondary | ICD-10-CM | POA: Diagnosis present

## 2022-09-22 DIAGNOSIS — Z8679 Personal history of other diseases of the circulatory system: Secondary | ICD-10-CM | POA: Insufficient documentation

## 2022-09-22 DIAGNOSIS — Z01812 Encounter for preprocedural laboratory examination: Secondary | ICD-10-CM

## 2022-09-22 HISTORY — DX: Other pulmonary embolism without acute cor pulmonale: I26.99

## 2022-09-22 HISTORY — DX: Other primary thrombophilia: D68.59

## 2022-09-22 HISTORY — DX: Obstructive sleep apnea (adult) (pediatric): G47.33

## 2022-09-22 LAB — CBC
HCT: 41.5 % (ref 39.0–52.0)
Hemoglobin: 12.7 g/dL — ABNORMAL LOW (ref 13.0–17.0)
MCH: 24 pg — ABNORMAL LOW (ref 26.0–34.0)
MCHC: 30.6 g/dL (ref 30.0–36.0)
MCV: 78.3 fL — ABNORMAL LOW (ref 80.0–100.0)
Platelets: 256 10*3/uL (ref 150–400)
RBC: 5.3 MIL/uL (ref 4.22–5.81)
RDW: 20.1 % — ABNORMAL HIGH (ref 11.5–15.5)
WBC: 7.1 10*3/uL (ref 4.0–10.5)
nRBC: 0 % (ref 0.0–0.2)

## 2022-09-22 LAB — BASIC METABOLIC PANEL
Anion gap: 5 (ref 5–15)
BUN: 15 mg/dL (ref 8–23)
CO2: 30 mmol/L (ref 22–32)
Calcium: 8.8 mg/dL — ABNORMAL LOW (ref 8.9–10.3)
Chloride: 104 mmol/L (ref 98–111)
Creatinine, Ser: 0.88 mg/dL (ref 0.61–1.24)
GFR, Estimated: >60 mL/min (ref >60)
Glucose, Bld: 112 mg/dL — ABNORMAL HIGH (ref 70–99)
Potassium: 4.5 mmol/L (ref 3.5–5.1)
Sodium: 139 mmol/L (ref 135–145)

## 2022-09-22 NOTE — Patient Instructions (Addendum)
Your procedure is scheduled on:10-01-22 Friday Report to the Registration Desk on the 1st floor of the [facility].Then proceed to the 2nd floor Surgery Desk To find out your arrival time, please call [phone] between 1PM - 3PM on:09-30-22 Thursday If your arrival time is 6:00 am, do not arrive before that time as the [facility] Mall entrance doors do not open until 6:00 am.  REMEMBER: Instructions that are not followed completely may result in serious medical risk, up to and including death; or upon the discretion of your surgeon and anesthesiologist your surgery may need to be rescheduled.  Do not eat food after midnight the night before surgery.  No gum chewing or hard candies.  You may however, drink Water up to 2 hours before you are scheduled to arrive for your surgery. Do not drink anything within 2 hours of your scheduled arrival time.  One week prior to surgery: Stop Anti-inflammatories (NSAIDS) such as Advil, Aleve, Ibuprofen, Motrin, Naproxen, Naprosyn and Aspirin based products such as Excedrin, Goody's Powder, BC Powder.You may however, continue to take Tylenol if needed for pain up until the day of surgery. Stop ANY OVER THE COUNTER supplements/vitamins NOW (09-22-22) until after surgery (Vitamin D3 and B12)   Continue taking all prescribed medications with the exception of the following: -Stop your warfarin (COUMADIN) 4 days prior to surgery-Last dose will be on 09-26-22 Sunday -Stop your metFORMIN (GLUCOPHAGE-XR) 2 days prior to surgery-Last dose will be on 09-28-22 Tuesday -Stop your tadalafil (CIALIS) 2 days prior to surgery-Last dose will be on 09-28-22  TAKE ONLY THESE MEDICATIONS THE MORNING OF SURGERY WITH A SIP OF WATER: -DULoxetine (CYMBALTA)  -gabapentin (NEURONTIN)  -pantoprazole (PROTONIX)  -tamsulosin (FLOMAX)  -You may take your HYDROcodone-acetaminophen (NORCO/VICODIN) if needed  Use your Albuterol Inhaler the morning of surgery and bring your Albuterol  Inhaler to the hospital  No Alcohol for 24 hours before or after surgery.  No Smoking including e-cigarettes for 24 hours before surgery.  No chewable tobacco products for at least 6 hours before surgery.  No nicotine patches on the day of surgery.  Do not use any "recreational" drugs for at least a week (preferably 2 weeks) before your surgery.  Please be advised that the combination of cocaine and anesthesia may have negative outcomes, up to and including death. If you test positive for cocaine, your surgery will be cancelled.  On the morning of surgery brush your teeth with toothpaste and water, you may rinse your mouth with mouthwash if you wish. Do not swallow any toothpaste or mouthwash.  Use CHG Soap as directed on instruction sheet.  Do not wear jewelry, make-up, hairpins, clips or nail polish.  Do not wear lotions, powders, or perfumes.   Do not shave body hair from the neck down 48 hours before surgery.  Contact lenses, hearing aids and dentures may not be worn into surgery.  Do not bring valuables to the hospital. [facility] is not responsible for any missing/lost belongings or valuables.   Bring your C-PAP to the hospital  Notify your doctor if there is any change in your medical condition (cold, fever, infection).  Wear comfortable clothing (specific to your surgery type) to the hospital.  After surgery, you can help prevent lung complications by doing breathing exercises.  Take deep breaths and cough every 1-2 hours. Your doctor may order a device called an Incentive Spirometer to help you take deep breaths. When coughing or sneezing, hold a pillow firmly against your incision with  both hands. This is called "splinting." Doing this helps protect your incision. It also decreases belly discomfort.  If you are being admitted to the hospital overnight, leave your suitcase in the car. After surgery it may be brought to your room.  In case of increased patient census,  it may be necessary for you, the patient, to continue your postoperative care in the Same Day Surgery department.  If you are being discharged the day of surgery, you will not be allowed to drive home. You will need a responsible individual to drive you home and stay with you for 24 hours after surgery.   If you are taking public transportation, you will need to have a responsible individual with you.  Please call the Pre-admissions Testing Dept. at [phone] if you have any questions about these instructions.  Surgery Visitation Policy:  Patients having surgery or a procedure may have two visitors.  Children under the age of 55 must have an adult with them who is not the patient.     Preparing for Surgery with CHLORHEXIDINE GLUCONATE (CHG) Soap  Chlorhexidine Gluconate (CHG) Soap  o An antiseptic cleaner that kills germs and bonds with the skin to continue killing germs even after washing  o Used for showering the night before surgery and morning of surgery  Before surgery, you can play an important role by reducing the number of germs on your skin.  CHG (Chlorhexidine gluconate) soap is an antiseptic cleanser which kills germs and bonds with the skin to continue killing germs even after washing.  Please do not use if you have an allergy to CHG or antibacterial soaps. If your skin becomes reddened/irritated stop using the CHG.  1. Shower the NIGHT BEFORE SURGERY and the MORNING OF SURGERY with CHG soap.  2. If you choose to wash your hair, wash your hair first as usual with your normal shampoo.  3. After shampooing, rinse your hair and body thoroughly to remove the shampoo.  4. Use CHG as you would any other liquid soap. You can apply CHG directly to the skin and wash gently with a scrungie or a clean washcloth.  5. Apply the CHG soap to your body only from the neck down. Do not use on open wounds or open sores. Avoid contact with your eyes, ears, mouth, and genitals  (private parts). Wash face and genitals (private parts) with your normal soap.  6. Wash thoroughly, paying special attention to the area where your surgery will be performed.  7. Thoroughly rinse your body with warm water.  8. Do not shower/wash with your normal soap after using and rinsing off the CHG soap.  9. Pat yourself dry with a clean towel.  10. Wear clean pajamas to bed the night before surgery.  12. Place clean sheets on your bed the night of your first shower and do not sleep with pets.  13. Shower again with the CHG soap on the day of surgery prior to arriving at the hospital.  14. Do not apply any deodorants/lotions/powders.  15. Please wear clean clothes to the hospital.

## 2022-09-30 MED ORDER — CHLORHEXIDINE GLUCONATE 0.12 % MT SOLN
15.0000 mL | Freq: Once | OROMUCOSAL | Status: AC
  Administered 2022-10-01: 15 mL via OROMUCOSAL

## 2022-09-30 MED ORDER — ORAL CARE MOUTH RINSE: 15.0000 mL | Freq: Once | OROMUCOSAL | Status: AC

## 2022-09-30 MED ORDER — CELECOXIB 200 MG PO CAPS
200.0000 mg | ORAL_CAPSULE | ORAL | Status: AC
  Administered 2022-10-01: 200 mg via ORAL

## 2022-09-30 MED ORDER — SODIUM CHLORIDE 0.9 % IV SOLN: INTRAVENOUS | Status: DC

## 2022-09-30 MED ORDER — CEFAZOLIN SODIUM-DEXTROSE 2-4 GM/100ML-% IV SOLN
2.0000 g | INTRAVENOUS | Status: AC
  Administered 2022-10-01: 3 g via INTRAVENOUS

## 2022-09-30 MED ORDER — CHLORHEXIDINE GLUCONATE CLOTH 2 % EX PADS
6.0000 | MEDICATED_PAD | Freq: Once | CUTANEOUS | Status: AC
  Administered 2022-10-01: 6 via TOPICAL

## 2022-10-01 ENCOUNTER — Encounter: Payer: Self-pay | Admitting: Surgery

## 2022-10-01 ENCOUNTER — Other Ambulatory Visit: Payer: Self-pay

## 2022-10-01 ENCOUNTER — Ambulatory Visit: Payer: Medicare Other | Admitting: Registered Nurse

## 2022-10-01 ENCOUNTER — Encounter
Admission: RE | Disposition: A | Discharge status: Home / Self Care | Payer: Self-pay | Source: Home / Self Care | Attending: Surgery

## 2022-10-01 ENCOUNTER — Ambulatory Visit
Admission: RE | Admit: 2022-10-01 | Discharge: 2022-10-01 | Disposition: A | Discharge status: Home / Self Care | Payer: Medicare Other | Attending: Surgery | Admitting: Surgery

## 2022-10-01 ENCOUNTER — Ambulatory Visit: Payer: Medicare Other | Admitting: Urgent Care

## 2022-10-01 DIAGNOSIS — Z6841 Body Mass Index (BMI) 40.0 and over, adult: Secondary | ICD-10-CM | POA: Insufficient documentation

## 2022-10-01 DIAGNOSIS — Z01818 Encounter for other preprocedural examination: Secondary | ICD-10-CM

## 2022-10-01 DIAGNOSIS — Z79899 Other long term (current) drug therapy: Secondary | ICD-10-CM | POA: Diagnosis not present

## 2022-10-01 DIAGNOSIS — G4730 Sleep apnea, unspecified: Secondary | ICD-10-CM | POA: Diagnosis not present

## 2022-10-01 DIAGNOSIS — Z7901 Long term (current) use of anticoagulants: Secondary | ICD-10-CM | POA: Insufficient documentation

## 2022-10-01 DIAGNOSIS — E119 Type 2 diabetes mellitus without complications: Secondary | ICD-10-CM | POA: Diagnosis not present

## 2022-10-01 DIAGNOSIS — K219 Gastro-esophageal reflux disease without esophagitis: Secondary | ICD-10-CM | POA: Insufficient documentation

## 2022-10-01 DIAGNOSIS — Z7984 Long term (current) use of oral hypoglycemic drugs: Secondary | ICD-10-CM | POA: Insufficient documentation

## 2022-10-01 DIAGNOSIS — K4091 Unilateral inguinal hernia, without obstruction or gangrene, recurrent: Secondary | ICD-10-CM | POA: Insufficient documentation

## 2022-10-01 DIAGNOSIS — K4031 Unilateral inguinal hernia, with obstruction, without gangrene, recurrent: Secondary | ICD-10-CM

## 2022-10-01 DIAGNOSIS — I10 Essential (primary) hypertension: Secondary | ICD-10-CM | POA: Diagnosis not present

## 2022-10-01 HISTORY — PX: INSERTION OF MESH: SHX5868

## 2022-10-01 LAB — GLUCOSE, CAPILLARY
Glucose-Capillary: 121 mg/dL — ABNORMAL HIGH (ref 70–99)
Glucose-Capillary: 156 mg/dL — ABNORMAL HIGH (ref 70–99)

## 2022-10-01 LAB — PROTIME-INR
INR: 1.2 (ref 0.8–1.2)
Prothrombin Time: 15.1 seconds (ref 11.4–15.2)

## 2022-10-01 SURGERY — HERNIORRHAPHY, INGUINAL, ROBOT-ASSISTED, LAPAROSCOPIC
Anesthesia: General | Site: Inguinal | Laterality: Right

## 2022-10-01 MED ORDER — BUPIVACAINE HCL (PF) 0.5 % IJ SOLN
INTRAMUSCULAR | Status: AC
  Filled 2022-10-01: qty 30

## 2022-10-01 MED ORDER — ONDANSETRON HCL 4 MG/2ML IJ SOLN
INTRAMUSCULAR | Status: AC
  Filled 2022-10-01: qty 2

## 2022-10-01 MED ORDER — DEXAMETHASONE SODIUM PHOSPHATE 10 MG/ML IJ SOLN
INTRAMUSCULAR | Status: AC
  Filled 2022-10-01: qty 1

## 2022-10-01 MED ORDER — HYDROCODONE-ACETAMINOPHEN 5-325 MG PO TABS: 1.0000 | ORAL_TABLET | Freq: Four times a day (QID) | ORAL | 0 refills | Status: DC

## 2022-10-01 MED ORDER — FENTANYL CITRATE (PF) 100 MCG/2ML IJ SOLN
INTRAMUSCULAR | Status: DC | PRN
  Administered 2022-10-01 (×2): 50 ug via INTRAVENOUS

## 2022-10-01 MED ORDER — ACETAMINOPHEN 10 MG/ML IV SOLN
INTRAVENOUS | Status: DC | PRN
  Administered 2022-10-01: 1000 mg via INTRAVENOUS

## 2022-10-01 MED ORDER — EPINEPHRINE PF 1 MG/ML IJ SOLN
INTRAMUSCULAR | Status: AC
  Filled 2022-10-01: qty 1

## 2022-10-01 MED ORDER — ROCURONIUM BROMIDE 10 MG/ML (PF) SYRINGE
PREFILLED_SYRINGE | INTRAVENOUS | Status: AC
  Filled 2022-10-01: qty 10

## 2022-10-01 MED ORDER — LIDOCAINE HCL (CARDIAC) PF 100 MG/5ML IV SOSY
PREFILLED_SYRINGE | INTRAVENOUS | Status: DC | PRN
  Administered 2022-10-01: 40 mg via INTRAVENOUS

## 2022-10-01 MED ORDER — FENTANYL CITRATE (PF) 100 MCG/2ML IJ SOLN: 25.0000 ug | INTRAMUSCULAR | Status: DC | PRN

## 2022-10-01 MED ORDER — DEXMEDETOMIDINE HCL IN NACL 80 MCG/20ML IV SOLN
INTRAVENOUS | Status: DC | PRN
  Administered 2022-10-01: 4 ug via INTRAVENOUS
  Administered 2022-10-01: 8 ug via INTRAVENOUS

## 2022-10-01 MED ORDER — PROPOFOL 10 MG/ML IV BOLUS
INTRAVENOUS | Status: DC | PRN
  Administered 2022-10-01: 50 mg via INTRAVENOUS
  Administered 2022-10-01: 150 mg via INTRAVENOUS

## 2022-10-01 MED ORDER — IBUPROFEN 400 MG PO TABS: 400.0000 mg | ORAL_TABLET | Freq: Three times a day (TID) | ORAL | 0 refills | Status: DC | PRN

## 2022-10-01 MED ORDER — DEXAMETHASONE SODIUM PHOSPHATE 10 MG/ML IJ SOLN
INTRAMUSCULAR | Status: DC | PRN
  Administered 2022-10-01: 10 mg via INTRAVENOUS

## 2022-10-01 MED ORDER — PROPOFOL 10 MG/ML IV BOLUS
INTRAVENOUS | Status: AC
  Filled 2022-10-01: qty 20

## 2022-10-01 MED ORDER — ONDANSETRON HCL 4 MG/2ML IJ SOLN
INTRAMUSCULAR | Status: DC | PRN
  Administered 2022-10-01: 4 mg via INTRAVENOUS

## 2022-10-01 MED ORDER — EPHEDRINE SULFATE (PRESSORS) 50 MG/ML IJ SOLN
INTRAMUSCULAR | Status: DC | PRN
  Administered 2022-10-01: 10 mg via INTRAVENOUS
  Administered 2022-10-01: 5 mg via INTRAVENOUS
  Administered 2022-10-01: 10 mg via INTRAVENOUS

## 2022-10-01 MED ORDER — BUPIVACAINE LIPOSOME 1.3 % IJ SUSP
INTRAMUSCULAR | Status: AC
  Filled 2022-10-01: qty 20

## 2022-10-01 MED ORDER — CEFAZOLIN SODIUM-DEXTROSE 2-4 GM/100ML-% IV SOLN
INTRAVENOUS | Status: AC
  Filled 2022-10-01: qty 100

## 2022-10-01 MED ORDER — BUPIVACAINE-EPINEPHRINE 0.5% -1:200000 IJ SOLN
INTRAMUSCULAR | Status: DC | PRN
  Administered 2022-10-01: 30 mL

## 2022-10-01 MED ORDER — LACTATED RINGERS IV SOLN: INTRAVENOUS | Status: DC | PRN

## 2022-10-01 MED ORDER — BUPIVACAINE LIPOSOME 1.3 % IJ SUSP
INTRAMUSCULAR | Status: DC | PRN
  Administered 2022-10-01: 20 mL

## 2022-10-01 MED ORDER — OXYCODONE HCL 5 MG/5ML PO SOLN: 5.0000 mg | Freq: Once | ORAL | Status: DC | PRN

## 2022-10-01 MED ORDER — DOCUSATE SODIUM 100 MG PO CAPS: 100.0000 mg | ORAL_CAPSULE | Freq: Two times a day (BID) | ORAL | 0 refills | Status: AC | PRN

## 2022-10-01 MED ORDER — ACETAMINOPHEN 325 MG PO TABS: 650.0000 mg | ORAL_TABLET | Freq: Three times a day (TID) | ORAL | 0 refills | Status: AC | PRN

## 2022-10-01 MED ORDER — OXYCODONE HCL 5 MG PO TABS: 5.0000 mg | ORAL_TABLET | Freq: Once | ORAL | Status: DC | PRN

## 2022-10-01 MED ORDER — SUCCINYLCHOLINE CHLORIDE 200 MG/10ML IV SOSY
PREFILLED_SYRINGE | INTRAVENOUS | Status: DC | PRN
  Administered 2022-10-01: 140 mg via INTRAVENOUS

## 2022-10-01 MED ORDER — CELECOXIB 200 MG PO CAPS
ORAL_CAPSULE | ORAL | Status: AC
  Filled 2022-10-01: qty 1

## 2022-10-01 MED ORDER — SUGAMMADEX SODIUM 200 MG/2ML IV SOLN
INTRAVENOUS | Status: DC | PRN
  Administered 2022-10-01: 300 mg via INTRAVENOUS

## 2022-10-01 MED ORDER — CHLORHEXIDINE GLUCONATE 0.12 % MT SOLN
OROMUCOSAL | Status: AC
  Filled 2022-10-01: qty 15

## 2022-10-01 MED ORDER — FENTANYL CITRATE (PF) 100 MCG/2ML IJ SOLN
INTRAMUSCULAR | Status: AC
  Filled 2022-10-01: qty 2

## 2022-10-01 MED ORDER — CEFAZOLIN SODIUM 1 G IJ SOLR
INTRAMUSCULAR | Status: AC
  Filled 2022-10-01: qty 10

## 2022-10-01 MED ORDER — ACETAMINOPHEN 10 MG/ML IV SOLN
INTRAVENOUS | Status: AC
  Filled 2022-10-01: qty 100

## 2022-10-01 MED ORDER — ROCURONIUM BROMIDE 100 MG/10ML IV SOLN
INTRAVENOUS | Status: DC | PRN
  Administered 2022-10-01: 50 mg via INTRAVENOUS
  Administered 2022-10-01: 10 mg via INTRAVENOUS
  Administered 2022-10-01: 20 mg via INTRAVENOUS
  Administered 2022-10-01 (×2): 10 mg via INTRAVENOUS

## 2022-10-01 SURGICAL SUPPLY — 46 items
ADH SKN CLS APL DERMABOND .7 (GAUZE/BANDAGES/DRESSINGS) ×1
BLADE SURG SZ11 CARB STEEL (BLADE) ×1 IMPLANT
BNDG GAUZE DERMACEA FLUFF 4 (GAUZE/BANDAGES/DRESSINGS) ×1 IMPLANT
BNDG GZE DERMACEA 4 6PLY (GAUZE/BANDAGES/DRESSINGS) ×1
COVER TIP SHEARS 8 DVNC (MISCELLANEOUS) ×1 IMPLANT
COVER WAND RF STERILE (DRAPES) ×1 IMPLANT
DERMABOND ADVANCED .7 DNX12 (GAUZE/BANDAGES/DRESSINGS) ×1 IMPLANT
DRAPE ARM DVNC X/XI (DISPOSABLE) ×3 IMPLANT
DRAPE COLUMN DVNC XI (DISPOSABLE) ×1 IMPLANT
ELECT CAUTERY BLADE 6.4 (BLADE) IMPLANT
ELECT REM PT RETURN 9FT ADLT (ELECTROSURGICAL) ×1
ELECTRODE REM PT RTRN 9FT ADLT (ELECTROSURGICAL) ×1 IMPLANT
FORCEPS BPLR FENES DVNC XI (FORCEP) ×1 IMPLANT
FORCEPS BPLR R/ABLATION 8 DVNC (INSTRUMENTS) IMPLANT
GLOVE BIOGEL PI IND STRL 7.0 (GLOVE) ×2 IMPLANT
GLOVE SURG SYN 6.5 ES PF (GLOVE) ×2 IMPLANT
GLOVE SURG SYN 6.5 PF PI (GLOVE) ×2 IMPLANT
GOWN STRL REUS W/ TWL LRG LVL3 (GOWN DISPOSABLE) ×3 IMPLANT
GOWN STRL REUS W/TWL LRG LVL3 (GOWN DISPOSABLE) ×3
KIT PINK PAD W/HEAD ARE REST (MISCELLANEOUS) ×1
KIT PINK PAD W/HEAD ARM REST (MISCELLANEOUS) IMPLANT
MESH 3DMAX MID 4X6 LT LRG (Mesh General) IMPLANT
MESH 3DMAX MID 4X6 RT LRG (Mesh General) IMPLANT
MESH 3DMAX MID 5X7 RT XLRG (Mesh General) IMPLANT
NDL DRIVE SUT CUT DVNC (INSTRUMENTS) ×1 IMPLANT
NDL HYPO 22X1.5 SAFETY MO (MISCELLANEOUS) ×1 IMPLANT
NDL INSUFFLATION 14GA 120MM (NEEDLE) ×1 IMPLANT
NEEDLE DRIVE SUT CUT DVNC (INSTRUMENTS) ×1 IMPLANT
NEEDLE HYPO 22X1.5 SAFETY MO (MISCELLANEOUS) ×1 IMPLANT
NEEDLE INSUFFLATION 14GA 120MM (NEEDLE) ×1 IMPLANT
OBTURATOR OPTICAL STND 8 DVNC (TROCAR) ×1
OBTURATOR OPTICALSTD 8 DVNC (TROCAR) ×1 IMPLANT
PACK LAP CHOLECYSTECTOMY (MISCELLANEOUS) ×1 IMPLANT
PENCIL SMOKE EVACUATOR (MISCELLANEOUS) ×1 IMPLANT
SCISSORS MNPLR CVD DVNC XI (INSTRUMENTS) ×1 IMPLANT
SEAL UNIV 5-12 XI (MISCELLANEOUS) ×3 IMPLANT
SET TUBE SMOKE EVAC HIGH FLOW (TUBING) ×1 IMPLANT
SOL ELECTROSURG ANTI STICK (MISCELLANEOUS) ×1
SOLUTION ELECTROSURG ANTI STCK (MISCELLANEOUS) ×1 IMPLANT
SUT MNCRL 4-0 (SUTURE) ×1
SUT MNCRL 4-0 27XMFL (SUTURE) ×1
SUT VIC AB 2-0 SH 27 (SUTURE) ×1
SUT VIC AB 2-0 SH 27XBRD (SUTURE) ×1 IMPLANT
SUT VLOC 90 6 CV-15 VIOLET (SUTURE) ×2 IMPLANT
SUTURE MNCRL 4-0 27XMF (SUTURE) ×1 IMPLANT
SYR 30ML LL (SYRINGE) ×1 IMPLANT

## 2022-10-01 NOTE — Op Note (Signed)
Preoperative diagnosis: right, incarcerated, recurrent inguinal Hernia.  Postoperative diagnosis: right inguinal Hernia  Procedure: Robotic assisted laparoscopic right inguinal hernia repair with mesh  Anesthesia: General  Surgeon: Dr. Tonna Boehringer  Wound Classification: Clean  Specimen: none  Complications: None  Estimated Blood Loss: 10mL   Indications:  inguinal hernia. Repair was indicated to avoid complications of incarceration, obstruction and pain, and a prosthetic mesh repair was elected.  See H&P for further details.  Findings: 1. Vas Deferens and cord structures identified and preserved 2. Bard 3D max medium weight mesh used for repair 3. Adequate hemostasis achieved  Description of procedure: The patient was taken to the operating room. A time-out was completed verifying correct patient, procedure, site, positioning, and implant(s) and/or special equipment prior to beginning this procedure.  Area was prepped and draped in the usual sterile fashion. An incision was marked 20 cm above the pubic tubercle, slightly above the umbilicus  Scrotum wrapped in Kerlix roll.  Veress needle inserted at palmer's point.  Saline drop test noted to be positive with gradual increase in pressure after initiation of gas insufflation.  15 mm of pressure was achieved prior to removing the Veress needle and then placing a 8 mm port via the Optiview technique through the supraumbilical site.  Inspection of the area afterwards noted no injury to the surrounding organs during insertion of the needle and the port.  2 port sites were marked 8 cm to the lateral sides of the initial port, and a 8 mm robotic port was placed on the left side, another 8 mm robotic port on the right side under direct supervision.  Local anesthesia  infused to the preplanned incision sites prior to insertion of the port.  The [employer] platform was then brought into the operative field and docked to the ports.  Examination of the  abdominal cavity noted a right inguinal hernia.  A peritoneal flap was created approximately 8cm cephalad to the defect by using scissors with electrocautery.  Dissection was carried down towards the pubic tubercle, developing the myopectineal orifice view.  Laterally the flap was carried towards the ASIS.  Very large direct 0hernia sac with portion of bladder wall and large amount of preperitoneal fat was noted, which carefully dissected away from the adjacent tissues to be fully reduced out of hernia cavity.  Any bleeding was controlled with combination of electrocautery and manual pressure.  Additional lipoma noted in indirect space adjacent to very fatty spermatic cord, and this was reduced as well.  After confirming adequate dissection and the peritoneal reflection completely down and away from the cord structures, a X-Large Bard 3DMax medium weight mesh was placed within the anterior abdominal wall, secured in place using 2-0 Vicryl on an SH needle immediately above the pubic tubercle.  After noting proper placement of the mesh with the peritoneal reflection deep to it, the previously created peritoneal flap was secured back up to the anterior abdominal wall using running 3-0 V-Lock x2.  Tears within the flap closed with additional 3-0 Vlock. All needles removed out of the abdominal cavity, Xi platform undocked from the ports and removed off of operative field.  exparel infused as ilioinguinal block.  Abdomen then desufflated and ports removed. All the skin incisions were then closed with a subcuticular stitch of Monocryl 4-0. Dermabond was applied. The testis was gently pulled down into its anatomic position in the scrotum.  The patient tolerated the procedure well and was taken to the postanesthesia care unit in stable  condition. Sponge and instrument count correct at end of procedure.

## 2022-10-01 NOTE — Interval H&P Note (Signed)
History and Physical Interval Note:  10/01/2022 9:17 AM  Lance [name]  has presented today for surgery, with the diagnosis of unilateral recurrent inguinal hernia w/o obstruction or gangrene K40.91.  The various methods of treatment have been discussed with the patient and family. After consideration of risks, benefits and other options for treatment, the patient has consented to  Procedure(s): XI ROBOTIC ASSISTED INGUINAL HERNIA w/ mesh (Right) as a surgical intervention.  The patient's history has been reviewed, patient examined, no change in status, stable for surgery.  I have reviewed the patient's chart and labs.  Questions were answered to the patient's satisfaction.     Kallie Depolo Tonna Boehringer

## 2022-10-01 NOTE — Anesthesia Preprocedure Evaluation (Signed)
Anesthesia Evaluation  Patient identified by MRN, date of birth, ID band Patient awake    Reviewed: Allergy & Precautions, NPO status , Patient's Chart, lab work & pertinent test results  History of Anesthesia Complications (+) PROLONGED EMERGENCE and history of anesthetic complications  Airway Mallampati: IV  TM Distance: >3 FB Neck ROM: full    Dental  (+) Dental Advidsory Given, Poor Dentition   Pulmonary shortness of breath and with exertion, sleep apnea and Continuous Positive Airway Pressure Ventilation ,  COPD inhaler, Not current smoker   Pulmonary exam normal        Cardiovascular hypertension, Pt. on medications (-) Past MI and (-) CHF negative cardio ROS Normal cardiovascular exam(-) dysrhythmias (-) Valvular Problems/Murmurs     Neuro/Psych neg Seizures negative neurological ROS  negative psych ROS   GI/Hepatic Neg liver ROS,GERD  Medicated and Controlled,,  Endo/Other  diabetes, Type 2, Oral Hypoglycemic Agents  Morbid obesity  Renal/GU negative Renal ROS     Musculoskeletal   Abdominal   Peds  Hematology negative hematology ROS (+)   Anesthesia Other Findings Past Medical History: No date: Acute cystitis No date: Allergic rhinitis No date: BPH (benign prostatic hyperplasia) No date: Bronchitis     Comment:  chronic No date: Carpal tunnel syndrome No date: CHF (congestive heart failure) (HCC) No date: Complication of anesthesia     Comment:  delayed emergence after fatty tumor surgery No date: DDD (degenerative disc disease), lumbar     Comment:  whole body, joints No date: Deficiency, protein S (HCC) No date: Degenerative lumbar spinal stenosis     Comment:  chronic back pain, left leg and foot No date: Diabetes mellitus (HCC)     Comment:  diet controlled No date: DISH (diffuse idiopathic skeletal hyperostosis)     Comment:  neck pain No date: Dyspnea     Comment:  with exertion, wheezing  occasionally No date: Erectile dysfunction No date: GERD (gastroesophageal reflux disease) No date: Gout No date: Headache     Comment:  migraines-occasionally No date: History of DVT (deep vein thrombosis)     Comment:  left leg-after left foot surgery No date: History of migraine headaches No date: History of pulmonary embolism     Comment:  bilateral No date: HOH (hard of hearing)     Comment:  wears aids No date: Hypercholesterolemia No date: Hypercoagulable state (HCC) No date: Hypertension     Comment:  controlled on meds No date: Hypogonadism in male No date: Infection of skin No date: Iron deficiency anemia No date: Lower urinary tract symptoms No date: Meralgia paresthetica No date: Neck pain No date: Obesity No date: OSA on CPAP No date: Post-void dribbling No date: Prostatitis No date: Pulmonary embolism (HCC)     Comment:  after carpal tunnel surgery No date: SNHL (sensorineural hearing loss) No date: Vertigo     Comment:  occasional  Past Surgical History: No date: Blood vessel Surgery 2011: CARPAL TUNNEL RELEASE; Bilateral 10/17/2017: CATARACT EXTRACTION W/PHACO; Right     Comment:  Procedure: CATARACT EXTRACTION PHACO AND INTRAOCULAR               LENS PLACEMENT (IOC)  RIGHT DIABETIC;  Surgeon:               Lockie Mola, MD;  Location: [facility];              Service: Ophthalmology;  Laterality: Right;  CPAP Diet  controlled diabetic 11/23/2017: CATARACT EXTRACTION W/PHACO; Left     Comment:  Procedure: CATARACT EXTRACTION PHACO AND INTRAOCULAR               LENS PLACEMENT (IOC)  LEFT DIABETIC;  Surgeon:               Lockie Mola, MD;  Location: [facility];              Service: Ophthalmology;  Laterality: Left;  Diabetic                sleep apnea 07/11/2015: COLONOSCOPY WITH PROPOFOL; N/A     Comment:  Procedure: COLONOSCOPY WITH PROPOFOL;  Surgeon: Christena Deem, MD;  Location:  [facility];  Service:               Endoscopy;  Laterality: N/A; 02/02/2019: COLONOSCOPY WITH PROPOFOL; N/A     Comment:  Procedure: COLONOSCOPY WITH PROPOFOL;  Surgeon:               Christena Deem, MD;  Location: [facility] ENDOSCOPY;                Service: Endoscopy;  Laterality: N/A; No date: FOOT SURGERY; Bilateral 2003: FOOT SURGERY; Left     Comment:  complicated by DVT 1984: HEMORRHOID SURGERY 1985: HERNIA REPAIR; Bilateral     Comment:  x2 08/28/2014: HOLEP-LASER ENUCLEATION OF THE PROSTATE WITH MORCELLATION     Comment:  Dr. Vanna Scotland 1985: HYDROCELE EXCISION / REPAIR     Comment:  x2 No date: Hydrocelectomy     Comment:  x 2 No date: INNER EAR SURGERY No date: JOINT REPLACEMENT     Comment:  left knee, right shoulder 1974: LEG SURGERY; Right     Comment:  fatty tumor No date: PROSTATE SURGERY 2012: REPLACEMENT TOTAL KNEE; Left 1968: SHOULDER SURGERY; Right 1958: TONSILLECTOMY 09/18/2014: TOTAL SHOULDER ARTHROPLASTY; Right     Comment:  arthroplasty, glenohumeral joint; total shoulder               (glenoid and prosimal humeral replacement) No date: TUMOR EXCISION; Right     Comment:  fatty tumor 1979: VASECTOMY  BMI    Body Mass Index: 41.56 kg/m      Reproductive/Obstetrics negative OB ROS                             Anesthesia Physical Anesthesia Plan  ASA: 3  Anesthesia Plan: General ETT   Post-op Pain Management:    Induction: Intravenous  PONV Risk Score and Plan: 3 and Ondansetron, Dexamethasone and Midazolam  Airway Management Planned: Oral ETT and Video Laryngoscope Planned  Additional Equipment:   Intra-op Plan:   Post-operative Plan: Extubation in OR  Informed Consent: I have reviewed the patients History and Physical, chart, labs and discussed the procedure including the risks, benefits and alternatives for the proposed anesthesia with the patient or authorized representative who has indicated his/her  understanding and acceptance.     Dental Advisory Given  Plan Discussed with: Anesthesiologist, CRNA and Surgeon  Anesthesia Plan Comments: (Patient consented for risks of anesthesia including but not limited to:  - adverse reactions to medications - damage to eyes, teeth, lips or other oral mucosa - nerve damage due to positioning  - sore throat or hoarseness - Damage to heart, brain, nerves, lungs, other parts of  body or loss of life  Patient voiced understanding.)        Anesthesia Quick Evaluation

## 2022-10-01 NOTE — Discharge Instructions (Addendum)
Hernia repair, Care After This sheet gives you information about how to care for yourself after your procedure. Your health care provider may also give you more specific instructions. If you have problems or questions, contact your health care provider. What can I expect after the procedure? After your procedure, it is common to have the following: Pain in your abdomen, especially in the incision areas. You will be given medicine to control the pain. Tiredness. This is a normal part of the recovery process. Your energy level will return to normal over the next several weeks. Changes in your bowel movements, such as constipation or needing to go more often. Talk with your health care provider about how to manage this. Follow these instructions at home: Medicines  tylenol and advil as needed for discomfort.  Please alternate between the two every four hours as needed for pain.    Use narcotics, if prescribed, only when tylenol and motrin is not enough to control pain.  325-650mg  every 8hrs to max of 3000mg /24hrs (including the 325mg  in every norco dose) for the tylenol.    Advil up to 800mg  per dose every 8hrs as needed for pain.   PLEASE RECORD NUMBER OF PILLS TAKEN UNTIL NEXT FOLLOW UP APPT.  THIS WILL HELP DETERMINE HOW READY YOU ARE TO BE RELEASED FROM ANY ACTIVITY RESTRICTIONS Do not drive or use heavy machinery while taking prescription pain medicine. Do not drink alcohol while taking prescription pain medicine.  Incision care    Follow instructions from your health care provider about how to take care of your incision areas. Make sure you: Keep your incisions clean and dry. Wash your hands with soap and water before and after applying medicine to the areas, and before and after changing your bandage (dressing). If soap and water are not available, use hand sanitizer. Change your dressing as told by your health care provider. Leave stitches (sutures), skin glue, or adhesive strips in  place. These skin closures may need to stay in place for 2 weeks or longer. If adhesive strip edges start to loosen and curl up, you may trim the loose edges. Do not remove adhesive strips completely unless your health care provider tells you to do that. Do not wear tight clothing over the incisions. Tight clothing may rub and irritate the incision areas, which may cause the incisions to open. Do not take baths, swim, or use a hot tub until your health care provider approves. OK TO SHOWER IN 24HRS.   Check your incision area every day for signs of infection. Check for: More redness, swelling, or pain. More fluid or blood. Warmth. Pus or a bad smell. Activity Avoid lifting anything that is heavier than 10 lb (4.5 kg) for 2 weeks or until your health care provider says it is okay. No pushing/pulling greater than 30lbs You may resume normal activities as told by your health care provider. Ask your health care provider what activities are safe for you. Take rest breaks during the day as needed. Eating and drinking Follow instructions from your health care provider about what you can eat after surgery. To prevent or treat constipation while you are taking prescription pain medicine, your health care provider may recommend that you: Drink enough fluid to keep your urine clear or pale yellow. Take over-the-counter or prescription medicines. Eat foods that are high in fiber, such as fresh fruits and vegetables, whole grains, and beans. Limit foods that are high in fat and processed sugars, such as fried and  sweet foods. General instructions Ask your health care provider when you will need an appointment to get your sutures or staples removed. Keep all follow-up visits as told by your health care provider. This is important. Contact a health care provider if: You have more redness, swelling, or pain around your incisions. You have more fluid or blood coming from the incisions. Your incisions feel  warm to the touch. You have pus or a bad smell coming from your incisions or your dressing. You have a fever. You have an incision that breaks open (edges not staying together) after sutures or staples have been removed. You develop a rash. You have chest pain or difficulty breathing. You have pain or swelling in your legs. You feel light-headed or you faint. Your abdomen swells (becomes distended). You have nausea or vomiting. You have blood in your stool (feces). This information is not intended to replace advice given to you by your health care provider. Make sure you discuss any questions you have with your health care provider. Document Released: 11/13/2004 Document Revised: 01/13/2018 Document Reviewed: 01/26/2016 Elsevier Interactive Patient Education  2019 [employer]   AMBULATORY SURGERY  DISCHARGE INSTRUCTIONS   The drugs that you were given will stay in your system until tomorrow so for the next 24 hours you should not:  Drive an automobile Make any legal decisions Drink any alcoholic beverage   You may resume regular meals tomorrow.  Today it is better to start with liquids and gradually work up to solid foods.  You may eat anything you prefer, but it is better to start with liquids, then soup and crackers, and gradually work up to solid foods.   Please notify your doctor immediately if you have any unusual bleeding, trouble breathing, redness and pain at the surgery site, drainage, fever, or pain not relieved by medication.    Additional Instructions: PLEASE LEAVE EXPAREL (TEAL) ARMBAND ON FOR 4 DAYS    Please contact your physician with any problems or Same Day Surgery at [phone], Monday through Friday 6 am to 4 pm, or [place] at [facility] number at [phone].

## 2022-10-01 NOTE — Transfer of Care (Signed)
Immediate Anesthesia Transfer of Care Note  Patient: Lance [name]  Procedure(s) Performed: XI ROBOTIC ASSISTED INGUINAL HERNIA w/ mesh (Right: Inguinal) INSERTION OF MESH (Right: Inguinal)  Patient Location: PACU  Anesthesia Type:General  Level of Consciousness: drowsy and patient cooperative  Airway & Oxygen Therapy: Patient Spontanous Breathing and Patient connected to face mask oxygen  Post-op Assessment: Report given to RN and Patient moving all extremities X 4  Post vital signs: Reviewed and stable  Last Vitals:  Vitals Value Taken Time  BP 142/68 10/01/22 1210  Temp    Pulse 101 10/01/22 1212  Resp 22 10/01/22 1212  SpO2 97 % 10/01/22 1212  Vitals shown include unvalidated device data.  Last Pain:  Vitals:   10/01/22 0858  TempSrc: Temporal  PainSc: 2       Patients Stated Pain Goal: 0 (10/01/22 0858)  Complications: No notable events documented.

## 2022-10-01 NOTE — Anesthesia Procedure Notes (Signed)
Procedure Name: Intubation Date/Time: 10/01/2022 9:40 AM  Performed by: Lily Lovings, CRNAPre-anesthesia Checklist: Patient identified, Patient being monitored, Timeout performed, Emergency Drugs available and Suction available Patient Re-evaluated:Patient Re-evaluated prior to induction Oxygen Delivery Method: Circle system utilized Preoxygenation: Pre-oxygenation with 100% oxygen Induction Type: IV induction and Cricoid Pressure applied Ventilation: Two handed mask ventilation required and Mask ventilation without difficulty Laryngoscope Size: McGraph and 4 Grade View: Grade II Tube type: Oral Tube size: 7.5 mm Number of attempts: 1 Airway Equipment and Method: Stylet Placement Confirmation: ETT inserted through vocal cords under direct vision, positive ETCO2 and breath sounds checked- equal and bilateral Secured at: 22 cm Tube secured with: Tape Dental Injury: Teeth and Oropharynx as per pre-operative assessment

## 2022-10-01 NOTE — Anesthesia Postprocedure Evaluation (Signed)
Anesthesia Post Note  Patient: STELIOS MCGHIE  Procedure(s) Performed: XI ROBOTIC ASSISTED INGUINAL HERNIA w/ mesh (Right: Inguinal) INSERTION OF MESH (Right: Inguinal)  Patient location during evaluation: PACU Anesthesia Type: General Level of consciousness: awake and alert Pain management: pain level controlled Vital Signs Assessment: post-procedure vital signs reviewed and stable Respiratory status: spontaneous breathing, nonlabored ventilation, respiratory function stable and patient connected to nasal cannula oxygen Cardiovascular status: blood pressure returned to baseline and stable Postop Assessment: no apparent nausea or vomiting Anesthetic complications: no  No notable events documented.   Last Vitals:  Vitals:   10/01/22 1240 10/01/22 1254  BP: (!) 146/65 (!) 155/65  Pulse: 95 97  Resp: 13 16  Temp:    SpO2: 93% 93%    Last Pain:  Vitals:   10/01/22 1254  TempSrc: Temporal  PainSc: 6                  Stephanie Coup

## 2022-10-11 ENCOUNTER — Ambulatory Visit (INDEPENDENT_AMBULATORY_CARE_PROVIDER_SITE_OTHER): Payer: Medicare Other | Admitting: Urology

## 2022-10-11 VITALS — BP 116/73 | HR 78 | Ht 72.0 in | Wt 292.0 lb

## 2022-10-11 DIAGNOSIS — E291 Testicular hypofunction: Secondary | ICD-10-CM | POA: Diagnosis not present

## 2022-10-11 MED ORDER — TESTOSTERONE UNDECANOATE 750 MG/3ML IM SOLN
750.0000 mg | Freq: Once | INTRAMUSCULAR | Status: AC
Start: 2022-10-11 — End: 2022-10-11
  Administered 2022-10-11: 750 mg via INTRAMUSCULAR

## 2022-10-11 NOTE — Progress Notes (Signed)
Aveed Injection  Due to Hypogonadism patient is present today for a Aveed Injection.  Medication: Aveed Dose: 750mg / 3ml Lot: [id number] Exp:05/08/2025 Location: left upper outer buttocks Injection of medication was given over a 60 second period of time.  Patient tolerated well, no complications were noted Patient remained in the office for 30 minutes post injection for monitoring and will report any side effects.  Performed by: August Luz, RN   Additional notes/ Follow up: 10weeks for Aveed injection

## 2022-10-27 ENCOUNTER — Ambulatory Visit: Payer: Medicare Other | Admitting: Urology

## 2022-10-28 ENCOUNTER — Other Ambulatory Visit: Payer: Medicare Other

## 2022-10-28 ENCOUNTER — Other Ambulatory Visit: Payer: Self-pay

## 2022-10-28 DIAGNOSIS — E349 Endocrine disorder, unspecified: Secondary | ICD-10-CM

## 2022-10-28 DIAGNOSIS — E291 Testicular hypofunction: Secondary | ICD-10-CM

## 2022-10-28 DIAGNOSIS — R972 Elevated prostate specific antigen [PSA]: Secondary | ICD-10-CM

## 2022-10-29 LAB — TESTOSTERONE: Testosterone: 356 ng/dL (ref 264–916)

## 2022-10-29 LAB — HEMOGLOBIN AND HEMATOCRIT, BLOOD
Hematocrit: 44.5 % (ref 37.5–51.0)
Hemoglobin: 13.3 g/dL (ref 13.0–17.7)

## 2022-10-29 LAB — PSA: Prostate Specific Ag, Serum: 0.6 ng/mL (ref 0.0–4.0)

## 2022-11-04 ENCOUNTER — Ambulatory Visit: Payer: Medicare Other | Admitting: Urology

## 2022-11-09 ENCOUNTER — Ambulatory Visit: Payer: Medicare Other | Admitting: Urology

## 2022-11-29 NOTE — Progress Notes (Unsigned)
04/27/22 9:08 AM   Lance [name] 03-07-1951 [id number]  Referring provider:  Luciana Axe, NP [address] [place],  [place] [address]  Urological history  1.  Testosterone deficiency -contributing factors of age, obesity, diabetes and chronic pain medications -testosterone level (10/2022) 356 -HCT/hemoglobin (10/2022) 44.5/13.3 -managed with AVEED -last injection 10/11/2022   2. BPH with LU TS -PSA (10/2022) 0.6 -s/p HoLEP 2016 -pathology of prostate chips negative -tamsulosin 0.4 mg daily   3. ED -contributing factors of age, BPH, testosterone deficiency, DM, HTN, HLD, spinal injury, COPD, anticoagulation therapy and sleep apnea (sleeps with CPAP) -tadalafil 20 mg, on-demand dosing   5. Bilateral renal cysts -contrast CT 01/2021 - Bilateral renal cysts measuring up to 4 cm in the left mid kidney -stable on CT renal stone study 09/2021 - stable when compared to prior study    6. Bladder herniation -CT renal stone study 09/2021 - right inguinal hernia containing fat and small portion of the right anterior lateral bladder wall, unchanged when compared to prior CT in 2022   Chief Complaint  Patient presents with   Hypogonadism     HPI: Lance [name] is a 72 y.o.male who presents today for a 6 month follow-up.    Previous records reviewed.   I PSS ***     IPSS     Row Name 04/27/22 0800         International Prostate Symptom Score   How often have you had the sensation of not emptying your bladder? Less than 1 in 5     How often have you had to urinate less than every two hours? About half the time     How often have you found you stopped and started again several times when you urinated? Less than 1 in 5 times     How often have you found it difficult to postpone urination? About half the time     How often have you had a weak urinary stream? Less than half the time     How often have you had to strain to start urination? About half the time      How many times did you typically get up at night to urinate? 2 Times     Total IPSS Score 15       Quality of Life due to urinary symptoms   If you were to spend the rest of your life with your urinary condition just the way it is now how would you feel about that? Mostly Satisfied               Score:  1-7 Mild 8-19 Moderate 20-35 Severe  SHIM ***   SHIM     Row Name 04/27/22 0848         SHIM: Over the last 6 months:   How do you rate your confidence that you could get and keep an erection? Very Low     When you had erections with sexual stimulation, how often were your erections hard enough for penetration (entering your partner)? Almost Never or Never     During sexual intercourse, how often were you able to maintain your erection after you had penetrated (entered) your partner? Almost Never or Never     During sexual intercourse, how difficult was it to maintain your erection to completion of intercourse? Extremely Difficult     When you attempted sexual intercourse, how often was it satisfactory for you? Almost Never or Never  SHIM Total Score   SHIM 5               Score: 1-7 Severe ED 8-11 Moderate ED 12-16 Mild-Moderate ED 17-21 Mild ED 22-25 No ED   PMH: Past Medical History:  Diagnosis Date   Acute cystitis    Allergic rhinitis    BPH (benign prostatic hyperplasia)    Bronchitis    chronic   Carpal tunnel syndrome    CHF (congestive heart failure) (HCC)    Complication of anesthesia    long time to wake after fatty tumor surgery   DDD (degenerative disc disease), lumbar    whole body, joints   Degenerative lumbar spinal stenosis    chronic back pain, left leg and foot   Diabetes mellitus (HCC)    diet controlled   DISH (diffuse idiopathic skeletal hyperostosis)    neck pain   Dyspnea    with exertion, wheezing occasionally   Erectile dysfunction    GERD (gastroesophageal reflux disease)    Gout    Headache     migraines-occasionally   History of DVT (deep vein thrombosis)    left leg   History of migraine headaches    History of pulmonary embolism    bilateral   HOH (hard of hearing)    wears aids   Hypercholesterolemia    Hypercoagulable state (HCC)    Hypertension    controlled on meds   Hypogonadism in male    Infection of skin    Iron deficiency anemia    Lower urinary tract symptoms    Meralgia paresthetica    Neck pain    Obesity    Post-void dribbling    Prostatitis    Sleep apnea    with CPAP   SNHL (sensorineural hearing loss)    Vertigo    occasional    Surgical History: Past Surgical History:  Procedure Laterality Date   Blood vessel Surgery     CARPAL TUNNEL RELEASE Bilateral 2011   CATARACT EXTRACTION W/PHACO Right 10/17/2017   Procedure: CATARACT EXTRACTION PHACO AND INTRAOCULAR LENS PLACEMENT (IOC)  RIGHT DIABETIC;  Surgeon: Lockie Mola, MD;  Location: [facility];  Service: Ophthalmology;  Laterality: Right;  CPAP Diet controlled diabetic   CATARACT EXTRACTION W/PHACO Left 11/23/2017   Procedure: CATARACT EXTRACTION PHACO AND INTRAOCULAR LENS PLACEMENT (IOC)  LEFT DIABETIC;  Surgeon: Lockie Mola, MD;  Location: [facility];  Service: Ophthalmology;  Laterality: Left;  Diabetic  sleep apnea   COLONOSCOPY WITH PROPOFOL N/A 07/11/2015   Procedure: COLONOSCOPY WITH PROPOFOL;  Surgeon: Christena Deem, MD;  Location: [facility];  Service: Endoscopy;  Laterality: N/A;   COLONOSCOPY WITH PROPOFOL N/A 02/02/2019   Procedure: COLONOSCOPY WITH PROPOFOL;  Surgeon: Christena Deem, MD;  Location: [facility];  Service: Endoscopy;  Laterality: N/A;   FOOT SURGERY Bilateral    FOOT SURGERY Left 2003   complicated by DVT   HEMORRHOID SURGERY  1984   HERNIA REPAIR Bilateral 1985   x2   HOLEP-LASER ENUCLEATION OF THE PROSTATE WITH MORCELLATION  08/28/2014   Dr. Vanna Scotland   HYDROCELE EXCISION / REPAIR  1985   x2    Hydrocelectomy     x 2   JOINT REPLACEMENT     left knee, right shoulder   LEG SURGERY Right 1974   fatty tumor   PROSTATE SURGERY     REPLACEMENT TOTAL KNEE Left 2012   SHOULDER SURGERY Right 1968   TONSILLECTOMY  1958  TOTAL SHOULDER ARTHROPLASTY Right 09/18/2014   arthroplasty, glenohumeral joint; total shoulder (glenoid and prosimal humeral replacement)   VASECTOMY  1979    Home Medications:  Allergies as of 04/27/2022       Reactions   Penicillins    Other reaction(s):unknown,as a child        Medication List        Accurate as of April 27, 2022  9:08 AM. If you have any questions, ask your nurse or doctor.          albuterol 108 (90 Base) MCG/ACT inhaler Commonly known as: VENTOLIN HFA Inhale into the lungs. Reported on 07/29/2015   cetirizine 10 MG chewable tablet Commonly known as: ZYRTEC Chew 10 mg by mouth daily.   Cholecalciferol 50 MCG (2000 UT) Caps Take 3 capsules daily for 3 months, then reduce to 1 capsule daily thereafter for Vitamin D Deficiency.   cyclobenzaprine 5 MG tablet Commonly known as: FLEXERIL   fluticasone 50 MCG/ACT nasal spray Commonly known as: FLONASE Reported on 07/29/2015   gabapentin 300 MG capsule Commonly known as: NEURONTIN Take 300 mg by mouth 4 (four) times daily.   HYDROcodone-acetaminophen 5-325 MG tablet Commonly known as: NORCO/VICODIN 5-325 tablets. 1/2 to 1 tablet 4 times a day   lisinopril-hydrochlorothiazide 10-12.5 MG tablet Commonly known as: ZESTORETIC Take 1 tablet by mouth daily. am   MULTIVITAMIN ADULT PO Take by mouth.   pantoprazole 40 MG tablet Commonly known as: PROTONIX 40 mg 2 (two) times daily.   pravastatin 20 MG tablet Commonly known as: PRAVACHOL Take 20 mg by mouth daily.   psyllium 58.6 % packet Commonly known as: METAMUCIL Take 1 packet by mouth daily.   tadalafil 20 MG tablet Commonly known as: CIALIS Take 1 tablet (20 mg total) by mouth daily as needed for erectile  dysfunction.   tamsulosin 0.4 MG Caps capsule Commonly known as: FLOMAX Take 1 capsule (0.4 mg total) by mouth daily.   torsemide 5 MG tablet Commonly known as: DEMADEX Take 5 mg by mouth daily.   warfarin 5 MG tablet Commonly known as: COUMADIN Take by mouth. 1/2 to 1 a day        Allergies:  Allergies  Allergen Reactions   Penicillins     Other reaction(s):unknown,as a child    Family History: Family History  Problem Relation Age of Onset   Thrombosis Other    Skin cancer Father    Kidney disease Neg Hx    Prostate cancer Neg Hx    Kidney cancer Neg Hx    Bladder Cancer Neg Hx     Social History:  reports that he has never smoked. He has never used smokeless tobacco. He reports that he does not drink alcohol and does not use drugs.   Physical Exam: BP 98/61   Pulse 93   Ht 6' (1.829 m)   Wt 286 lb (129.7 kg)   BMI 38.79 kg/m   Constitutional:  Well nourished. Alert and oriented, No acute distress. HEENT: [place] AT, moist mucus membranes.  Trachea midline Cardiovascular: No clubbing, cyanosis, or edema. Respiratory: Normal respiratory effort, no increased work of breathing. GU: No CVA tenderness.  No bladder fullness or masses.  Patient with circumcised/uncircumcised phallus. ***Foreskin easily retracted***  Urethral meatus is patent.  No penile discharge. No penile lesions or rashes. Scrotum without lesions, cysts, rashes and/or edema.  Testicles are located scrotally bilaterally. No masses are appreciated in the testicles. Left and right epididymis are normal. Rectal: Patient  with  normal sphincter tone. Anus and perineum without scarring or rashes. No rectal masses are appreciated. Prostate is approximately *** grams, *** nodules are appreciated. Seminal vesicles are normal. Neurologic: Grossly intact, no focal deficits, moving all 4 extremities. Psychiatric: Normal mood and affect.   Laboratory Data: Results for orders placed or performed in visit on 10/28/22   PSA  Result Value Ref Range   Prostate Specific Ag, Serum 0.6 0.0 - 4.0 ng/mL  Testosterone  Result Value Ref Range   Testosterone 356 264 - 916 ng/dL  Hemoglobin and Hematocrit, Blood  Result Value Ref Range   Hemoglobin 13.3 13.0 - 17.7 g/dL   Hematocrit 19.1 47.8 - 51.0 %      Latest Ref Rng & Units 09/22/2022    3:17 PM 08/19/2014    1:20 PM  BMP  Glucose 70 - 99 mg/dL 295  92   BUN 8 - 23 mg/dL 15  14   Creatinine 6.21 - 1.24 mg/dL 3.08  6.57   Sodium 846 - 145 mmol/L 139  136   Potassium 3.5 - 5.1 mmol/L 4.5  3.8   Chloride 98 - 111 mmol/L 104  103   CO2 22 - 32 mmol/L 30  28   Calcium 8.9 - 10.3 mg/dL 8.8  9.1    I have reviewed the labs.    Pertinent Imaging: N/A    Assessment & Plan:    1. Testosterone deficiency  -testosterone levels are sub therapeutic  -HCT/hemoglobin WNL -continue AVEED  2. BPH with LUTS -PSA stable -symptoms - split stream/urgency -continue conservative management, avoiding bladder irritants and timed voiding's -tamsulosin 0.4 mg daily  3. Erectile dysfunction:    - intolerable side effects w/ PDE5i's     Return in about 6 months (around 10/27/2022) for PSA, testosterone, H & H, IPSS, SHIM and exam.  Michiel Cowboy, PA-C   [facility] [address], [address] [place], [place] [address] [phone]

## 2022-11-30 ENCOUNTER — Ambulatory Visit: Payer: Medicare Other | Admitting: Urology

## 2022-11-30 ENCOUNTER — Encounter: Payer: Self-pay | Admitting: Urology

## 2022-11-30 ENCOUNTER — Ambulatory Visit (INDEPENDENT_AMBULATORY_CARE_PROVIDER_SITE_OTHER): Payer: Medicare Other | Admitting: Urology

## 2022-11-30 VITALS — BP 128/69 | HR 94 | Wt 300.0 lb

## 2022-11-30 DIAGNOSIS — E291 Testicular hypofunction: Secondary | ICD-10-CM

## 2022-11-30 DIAGNOSIS — N401 Enlarged prostate with lower urinary tract symptoms: Secondary | ICD-10-CM

## 2022-11-30 DIAGNOSIS — N529 Male erectile dysfunction, unspecified: Secondary | ICD-10-CM | POA: Diagnosis not present

## 2022-12-20 ENCOUNTER — Ambulatory Visit: Payer: Medicare Other | Admitting: Physician Assistant

## 2022-12-20 NOTE — Progress Notes (Unsigned)
12/21/22 2:48 PM   Lance [name] February 19, 1951 [id number]  Referring provider:  Luciana Axe, NP [address] [place],  [place] [address]  Urological history  1.  Testosterone deficiency -contributing factors of age, obesity, diabetes and chronic pain medications -testosterone level (10/2022) 356 -HCT/hemoglobin (10/2022) 44.5/13.3 -managed with AVEED -last injection 10/11/2022   2. BPH with LU TS -PSA (10/2022) 0.6 -s/p HoLEP 2016 -pathology of prostate chips negative -tadalafil 10 mg daily     3. ED -contributing factors of age, BPH, testosterone deficiency, DM, HTN, HLD, spinal injury, COPD, anticoagulation therapy and sleep apnea (sleeps with CPAP) -tadalafil 10 mg daily    5. Bilateral renal cysts -contrast CT 01/2021 - Bilateral renal cysts measuring up to 4 cm in the left mid kidney -stable on CT renal stone study 09/2021 - stable when compared to prior study    6. Bladder herniation -CT renal stone study 09/2021 - right inguinal hernia containing fat and small portion of the right anterior lateral bladder wall, unchanged when compared to prior CT in 2022   Chief Complaint  Patient presents with   aveed injection     HPI: Lance [name] is a 72 y.o.male who presents today for a 6 month follow-up.    Previous records reviewed.   When I saw him on November 30, 2022, he was having issues with ejaculation and erectile dysfunction.  He was instructed to discontinue the tamsulosin, take 10 mg tadalafil on the morning of the day he wants to have a large and then augment with 10 mg again 30 minutes prior to having intercourse that evening as he was having intolerance to the medication.   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  Patient denies any modifying or aggravating factors.  Patient denies any recent UTI's, gross hematuria, dysuria or  suprapubic/flank pain.  Patient denies any fevers, chills, nausea or vomiting.    IPSS     Row Name 12/21/22 1400         International Prostate Symptom Score   How often have you had the sensation of not emptying your bladder? Less than 1 in 5     How often have you had to urinate less than every two hours? Less than half the time     How often have you found you stopped and started again several times when you urinated? Less than half the time     How often have you found it difficult to postpone urination? Less than half the time     How often have you had a weak urinary stream? Less than 1 in 5 times     How often have you had to strain to start urination? Less than half the time     How many times did you typically get up at night to urinate? 1 Time     Total IPSS Score 11       Quality of Life due to urinary symptoms   If you were to spend the rest of your life with your urinary condition just the way it is now how would you feel about that? Mostly Satisfied              Score:  1-7 Mild 8-19 Moderate 20-35 Severe  SHIM 8  He did not see much improvement with taking the 10 mg tadalafil in  the morning and then the 10 mg tadalafil right before intercourse.  He states he lost his erection quicker.  He was also prescribed Wellbutrin recently by his PCP for his ejaculatory disorder which worsened his ED.   SHIM     Row Name 12/21/22 1413         SHIM: Over the last 6 months:   How do you rate your confidence that you could get and keep an erection? Very Low     When you had erections with sexual stimulation, how often were your erections hard enough for penetration (entering your partner)? Sometimes (about half the time)     During sexual intercourse, how often were you able to maintain your erection after you had penetrated (entered) your partner? A Few Times (much less than half the time)     During sexual intercourse, how difficult was it to maintain your erection to  completion of intercourse? Extremely Difficult     When you attempted sexual intercourse, how often was it satisfactory for you? Almost Never or Never       SHIM Total Score   SHIM 8             Score: 1-7 Severe ED 8-11 Moderate ED 12-16 Mild-Moderate ED 17-21 Mild ED 22-25 No ED   PMH: Past Medical History:  Diagnosis Date   Acute cystitis    Allergic rhinitis    BPH (benign prostatic hyperplasia)    Bronchitis    chronic   Carpal tunnel syndrome    CHF (congestive heart failure) (HCC)    Complication of anesthesia    delayed emergence after fatty tumor surgery   DDD (degenerative disc disease), lumbar    whole body, joints   Deficiency, protein S (HCC)    Degenerative lumbar spinal stenosis    chronic back pain, left leg and foot   Diabetes mellitus (HCC)    diet controlled   DISH (diffuse idiopathic skeletal hyperostosis)    neck pain   Dyspnea    with exertion, wheezing occasionally   Erectile dysfunction    GERD (gastroesophageal reflux disease)    Gout    Headache    migraines-occasionally   History of DVT (deep vein thrombosis)    left leg-after left foot surgery   History of migraine headaches    History of pulmonary embolism    bilateral   HOH (hard of hearing)    wears aids   Hypercholesterolemia    Hypercoagulable state (HCC)    Hypertension    controlled on meds   Hypogonadism in male    Infection of skin    Iron deficiency anemia    Lower urinary tract symptoms    Meralgia paresthetica    Neck pain    Obesity    OSA on CPAP    Post-void dribbling    Prostatitis    Pulmonary embolism (HCC)    after carpal tunnel surgery   SNHL (sensorineural hearing loss)    Vertigo    occasional    Surgical History: Past Surgical History:  Procedure Laterality Date   Blood vessel Surgery     CARPAL TUNNEL RELEASE Bilateral 2011   CATARACT EXTRACTION W/PHACO Right 10/17/2017   Procedure: CATARACT EXTRACTION PHACO AND INTRAOCULAR LENS  PLACEMENT (IOC)  RIGHT DIABETIC;  Surgeon: Lockie Mola, MD;  Location: [facility];  Service: Ophthalmology;  Laterality: Right;  CPAP Diet controlled diabetic   CATARACT EXTRACTION W/PHACO Left 11/23/2017   Procedure: CATARACT EXTRACTION PHACO  AND INTRAOCULAR LENS PLACEMENT (IOC)  LEFT DIABETIC;  Surgeon: Lockie Mola, MD;  Location: [facility];  Service: Ophthalmology;  Laterality: Left;  Diabetic  sleep apnea   COLONOSCOPY WITH PROPOFOL N/A 07/11/2015   Procedure: COLONOSCOPY WITH PROPOFOL;  Surgeon: Christena Deem, MD;  Location[facility];  Service: Endoscopy;  Laterality: N/A;   COLONOSCOPY WITH PROPOFOL N/A 02/02/2019   Procedure: COLONOSCOPY WITH PROPOFOL;  Surgeon: Christena Deem, MD;  Location: [facility] ENDOSCOPY;  Service: Endoscopy;  Laterality: N/A;   FOOT SURGERY Bilateral    FOOT SURGERY Left 2003   complicated by DVT   HEMORRHOID SURGERY  1984   HERNIA REPAIR Bilateral 1985   x2   HOLEP-LASER ENUCLEATION OF THE PROSTATE WITH MORCELLATION  08/28/2014   Dr. Vanna Scotland   HYDROCELE EXCISION / REPAIR  1985   x2   Hydrocelectomy     x 2   INNER EAR SURGERY     INSERTION OF MESH Right 10/01/2022   Procedure: INSERTION OF MESH;  Surgeon: Sung Amabile, DO;  Location: [facility] ORS;  Service: General;  Laterality: Right;   JOINT REPLACEMENT     left knee, right shoulder   LEG SURGERY Right 1974   fatty tumor   PROSTATE SURGERY     REPLACEMENT TOTAL KNEE Left 2012   SHOULDER SURGERY Right 1968   TONSILLECTOMY  1958   TOTAL SHOULDER ARTHROPLASTY Right 09/18/2014   arthroplasty, glenohumeral joint; total shoulder (glenoid and prosimal humeral replacement)   TUMOR EXCISION Right    fatty tumor   VASECTOMY  1979    Home Medications:  Allergies as of 12/21/2022       Reactions   Penicillins    Other reaction(s):unknown,as a child        Medication List        Accurate as of December 21, 2022  2:48 PM. If you have any questions,  ask your nurse or doctor.          albuterol 108 (90 Base) MCG/ACT inhaler Commonly known as: VENTOLIN HFA Inhale 1-2 puffs into the lungs every 6 (six) hours as needed. Reported on 07/29/2015   cetirizine 10 MG chewable tablet Commonly known as: ZYRTEC Chew 10 mg by mouth daily as needed.   cyanocobalamin 1000 MCG tablet Commonly known as: VITAMIN B12 Take 1,000 mcg by mouth daily.   cyclobenzaprine 5 MG tablet Commonly known as: FLEXERIL Take 5 mg by mouth 3 (three) times daily as needed.   DULoxetine 60 MG capsule Commonly known as: CYMBALTA Take 60 mg by mouth every morning.   fluticasone 50 MCG/ACT nasal spray Commonly known as: FLONASE 2 sprays as needed.   gabapentin 300 MG capsule Commonly known as: NEURONTIN Take 300 mg by mouth 4 (four) times daily.   HYDROcodone-acetaminophen 5-325 MG tablet Commonly known as: NORCO/VICODIN Take 1 tablet by mouth every 6 (six) hours.   ibuprofen 400 MG tablet Commonly known as: ADVIL Take 1 tablet (400 mg total) by mouth every 8 (eight) hours as needed for mild pain or moderate pain.   lisinopril-hydrochlorothiazide 10-12.5 MG tablet Commonly known as: ZESTORETIC Take 0.5 tablets by mouth every morning. am   metFORMIN 500 MG 24 hr tablet Commonly known as: GLUCOPHAGE-XR Take 1,000 mg by mouth every evening.   pantoprazole 40 MG tablet Commonly known as: PROTONIX 40 mg 2 (two) times daily.   pravastatin 20 MG tablet Commonly known as: PRAVACHOL Take 20 mg by mouth at bedtime.   propranolol 10 MG  tablet Commonly known as: INDERAL Take 10 mg by mouth 4 (four) times daily as needed (Takes for Tremors).   psyllium 58.6 % packet Commonly known as: METAMUCIL Take 1 packet by mouth as needed.   tadalafil 20 MG tablet Commonly known as: CIALIS Take 1 tablet (20 mg total) by mouth daily as needed for erectile dysfunction.   tamsulosin 0.4 MG Caps capsule Commonly known as: FLOMAX Take 1 capsule (0.4 mg total) by  mouth daily. What changed: when to take this   torsemide 5 MG tablet Commonly known as: DEMADEX Take 5 mg by mouth as needed (edema).   Vitamin D-3 125 MCG (5000 UT) Tabs Take 1 tablet by mouth daily at 6 (six) AM.   warfarin 5 MG tablet Commonly known as: COUMADIN Take 2.5-5 mg by mouth daily. 5 mg M, W, F and 2.5 mg on T, Th, Sat and Sun        Allergies:  Allergies  Allergen Reactions   Penicillins     Other reaction(s):unknown,as a child    Family History: Family History  Problem Relation Age of Onset   Thrombosis Other    Skin cancer Father    Kidney disease Neg Hx    Prostate cancer Neg Hx    Kidney cancer Neg Hx    Bladder Cancer Neg Hx     Social History:  reports that he has never smoked. He has never used smokeless tobacco. He reports that he does not drink alcohol and does not use drugs.   Physical Exam: BP 109/61   Pulse 80   Ht 6' (1.829 m)   Wt 300 lb (136.1 kg)   BMI 40.69 kg/m   Constitutional:  Well nourished. Alert and oriented, No acute distress. HEENT: [place] AT, moist mucus membranes.  Trachea midline Cardiovascular: No clubbing, cyanosis, or edema. Respiratory: Normal respiratory effort, no increased work of breathing. Neurologic: Grossly intact, no focal deficits, moving all 4 extremities. Psychiatric: Normal mood and affect.   Laboratory Data: Basic Metabolic Panel (BMP) Order: [id number] Component Ref Range & Units 5 d ago  Glucose 70 - 110 mg/dL 409 High   Sodium 811 - 145 mmol/L 142  Potassium 3.6 - 5.1 mmol/L 5.0  Chloride 97 - 109 mmol/L 107  Carbon Dioxide (CO2) 22.0 - 32.0 mmol/L 32.6 High   Calcium 8.7 - 10.3 mg/dL 9.4  Urea Nitrogen (BUN) 7 - 25 mg/dL 17  Creatinine 0.7 - 1.3 mg/dL 0.9  Glomerular Filtration Rate (eGFR) >60 mL/min/1.73sq m 90  Comment: CKD-EPI (2021) does not include patient's race in the calculation of eGFR.  Monitoring changes of plasma creatinine and eGFR over time is useful for monitoring  kidney function.  Interpretive Ranges for eGFR (CKD-EPI 2021):  eGFR:       >60 mL/min/1.73 sq. m - Normal eGFR:       30-59 mL/min/1.73 sq. m - Moderately Decreased eGFR:       15-29 mL/min/1.73 sq. m  - Severely Decreased eGFR:       < 15 mL/min/1.73 sq. m  - Kidney Failure   Note: These eGFR calculations do not apply in acute situations when eGFR is changing rapidly or patients on dialysis.  BUN/Crea Ratio 6.0 - 20.0 18.9  Anion Gap w/K 6.0 - 16.0 7.4  Resulting Agency [facility]   Specimen Collected: 12/15/22 11:10   Performed by: [facility] Last Resulted: 12/15/22 16:13  Received From: [facility]  Result  Received: 12/17/22 14:24   Hemoglobin A1C Order: [id number] Component Ref Range & Units 5 d ago  Hemoglobin A1C 4.2 - 5.6 % 6.7 High   Average Blood Glucose (Calc) mg/dL 409  Resulting Agency [facility]  Narrative Performed by [facility] - LAB Normal Range:    4.2 - 5.6% Increased Risk:  5.7 - 6.4% Diabetes:        >= 6.5% Glycemic Control for adults with diabetes:  <7%    Specimen Collected: 12/15/22 11:10   Performed by: [facility] Last Resulted: 12/15/22 16:28  Received From: [facility]  Result Received: 12/17/22 14:24   I have reviewed the labs.    Pertinent Imaging: N/A    Assessment & Plan:    1. Testosterone deficiency  -AVEED given today -continue AVEED  2. BPH with LUTS -PSA stable -no worsening of symptoms when discontinued the tamsulosin -continue conservative management, avoiding bladder irritants and timed voiding's -start tadalafil 10 mg daily  3. Erectile dysfunction:    -he will continue tadalafil 20 mg daily  Return in about 10 weeks (around 03/01/2023) for AVEED.  Cloretta Ned   [facility] [address] [place], [place] [address] [phone]

## 2022-12-21 ENCOUNTER — Encounter: Payer: Self-pay | Admitting: Urology

## 2022-12-21 ENCOUNTER — Ambulatory Visit (INDEPENDENT_AMBULATORY_CARE_PROVIDER_SITE_OTHER): Payer: Medicare Other | Admitting: Urology

## 2022-12-21 VITALS — BP 109/61 | HR 80 | Ht 72.0 in | Wt 300.0 lb

## 2022-12-21 DIAGNOSIS — N138 Other obstructive and reflux uropathy: Secondary | ICD-10-CM | POA: Diagnosis not present

## 2022-12-21 DIAGNOSIS — N401 Enlarged prostate with lower urinary tract symptoms: Secondary | ICD-10-CM

## 2022-12-21 DIAGNOSIS — E291 Testicular hypofunction: Secondary | ICD-10-CM

## 2022-12-21 DIAGNOSIS — N529 Male erectile dysfunction, unspecified: Secondary | ICD-10-CM | POA: Diagnosis not present

## 2022-12-21 LAB — BLADDER SCAN AMB NON-IMAGING

## 2022-12-21 MED ORDER — TESTOSTERONE UNDECANOATE 750 MG/3ML IM SOLN
Freq: Once | INTRAMUSCULAR | Status: AC
Start: 2022-12-21 — End: 2022-12-21
  Administered 2022-12-21: 750 mg via INTRAMUSCULAR

## 2022-12-21 NOTE — Progress Notes (Unsigned)
Aveed Injection  Due to Hypogonadism patient is present today for a Aveed Injection.  Medication: Aveed Dose: 750mg / 3ml ZOX:[id number] Exp:07/2025 Location: left upper outer buttocks Injection of medication was given over a 60 second period of time.  Patient tolerated well, no complications were noted Patient remained in the office for 30 minutes post injection for monitoring and will report any side effects.  Performed by: Arllene L RMA  Additional notes/ Follow up: f/up 10 weeks for Aveed injection

## 2023-02-17 ENCOUNTER — Other Ambulatory Visit: Payer: Self-pay | Admitting: Urology

## 2023-02-17 DIAGNOSIS — N401 Enlarged prostate with lower urinary tract symptoms: Secondary | ICD-10-CM

## 2023-03-01 ENCOUNTER — Ambulatory Visit: Payer: Medicare Other | Admitting: Urology

## 2023-03-15 ENCOUNTER — Ambulatory Visit: Payer: Medicare Other | Admitting: Urology

## 2023-03-15 DIAGNOSIS — E291 Testicular hypofunction: Secondary | ICD-10-CM | POA: Diagnosis not present

## 2023-03-15 DIAGNOSIS — R972 Elevated prostate specific antigen [PSA]: Secondary | ICD-10-CM

## 2023-03-15 MED ORDER — TESTOSTERONE UNDECANOATE 750 MG/3ML IM SOLN
3.0000 mL | Freq: Once | INTRAMUSCULAR | Status: AC
Start: 2023-03-15 — End: 2023-03-15
  Administered 2023-03-15: 750 mg via INTRAMUSCULAR

## 2023-03-15 NOTE — Progress Notes (Signed)
Aveed Injection   Due to Hypogonadism patient is present today for a Aveed Injection.   Medication: Aveed Dose: 750mg / 3ml Lot: [id number] Exp:07/2025 Location: right upper outer buttocks Injection of medication was given over a 60 second period of time.  Patient tolerated well, no complications were noted Patient remained in the office for 30 minutes post injection for monitoring and will report any side effects.   Performed by: Ples Specter CMA  Additional notes/ Follow up: 10weeks for Aveed injection

## 2023-03-16 LAB — TESTOSTERONE: Testosterone: 274 ng/dL (ref 264–916)

## 2023-03-16 LAB — PSA: Prostate Specific Ag, Serum: 0.5 ng/mL (ref 0.0–4.0)

## 2023-03-16 LAB — HEMOGLOBIN: Hemoglobin: 13.4 g/dL (ref 13.0–17.7)

## 2023-05-12 ENCOUNTER — Telehealth: Payer: Self-pay

## 2023-05-12 NOTE — Telephone Encounter (Signed)
 Waiting on PA response for Aveed via [employer]. Reference #[id number]

## 2023-05-19 ENCOUNTER — Telehealth: Payer: Self-pay | Admitting: Urology

## 2023-05-19 NOTE — Telephone Encounter (Signed)
 PA denied, fax scanned into media and placed on providers desk

## 2023-05-19 NOTE — Telephone Encounter (Signed)
 I would like to have Lance [name] come in for lab work to see if we can get his testosterone replacement therapy covered under his insurance.  I will need an LH, FSH and a serum testosterone level before 10 AM.

## 2023-05-20 NOTE — Telephone Encounter (Signed)
Spoke with patient and advised results Appts scheduled 

## 2023-05-24 ENCOUNTER — Ambulatory Visit: Payer: Medicare Other | Admitting: Urology

## 2023-05-27 ENCOUNTER — Other Ambulatory Visit: Payer: Self-pay

## 2023-05-27 DIAGNOSIS — E291 Testicular hypofunction: Secondary | ICD-10-CM

## 2023-05-27 NOTE — Addendum Note (Signed)
Addended by: Sueanne Margarita on: 05/27/2023 10:44 AM   Modules accepted: Orders

## 2023-05-27 NOTE — Addendum Note (Signed)
Addended by: Sueanne Margarita on: 05/27/2023 10:57 AM   Modules accepted: Orders

## 2023-05-30 ENCOUNTER — Telehealth: Payer: Self-pay | Admitting: Urology

## 2023-05-30 ENCOUNTER — Other Ambulatory Visit: Payer: Medicare Other

## 2023-05-30 DIAGNOSIS — E291 Testicular hypofunction: Secondary | ICD-10-CM

## 2023-05-30 NOTE — Telephone Encounter (Signed)
Spoke with patient and advised he need to keep appt

## 2023-05-30 NOTE — Telephone Encounter (Signed)
Pt wants to make sure he needs to cancel appt for this Thursday with Carollee Herter for Aveed, since it was denied?

## 2023-05-30 NOTE — Progress Notes (Deleted)
 05/30/23 4:54 PM   [name] 1951/04/03 [id number]  Referring provider:  Luciana Axe, NP [address] [place],  [place] [address]  Urological history  1.  Testosterone deficiency -contributing factors of age, obesity, diabetes and chronic pain medications -testosterone level pending -HCT/hemoglobin (03/2023) 13.4 -failed Testopel  -insurance with not cover AVEED    2. BPH with LU TS -PSA (03/2023) 0.5 -s/p HoLEP 2016 -pathology of prostate chips negative -tadalafil 10 mg daily    3. ED -contributing factors of age, BPH, testosterone deficiency, DM, HTN, HLD, spinal injury, COPD, anticoagulation therapy and sleep apnea (sleeps with CPAP) -tadalafil 10 mg daily    5. Bilateral renal cysts -contrast CT 01/2021 - Bilateral renal cysts measuring up to 4 cm in the left mid kidney -stable on CT renal stone study 09/2021 - stable when compared to prior study    6. Bladder herniation -CT renal stone study 09/2021 - right inguinal hernia containing fat and small portion of the right anterior lateral bladder wall, unchanged when compared to prior CT in 2022  No chief complaint on file.   HPI: ESPER TRUELOCK is a 73 y.o.male who presents today to discuss alternative testosterone therapy as his insurance will not cover Aveed.  Previous records reviewed.   I PSS ***     Score:  1-7 Mild 8-19 Moderate 20-35 Severe  SHIM ***     Score: 1-7 Severe ED 8-11 Moderate ED 12-16 Mild-Moderate ED 17-21 Mild ED 22-25 No ED   PMH: Past Medical History:  Diagnosis Date   Acute cystitis    Allergic rhinitis    BPH (benign prostatic hyperplasia)    Bronchitis    chronic   Carpal tunnel syndrome    CHF (congestive heart failure) (HCC)    Complication of anesthesia    delayed emergence after fatty tumor surgery   DDD (degenerative disc disease), lumbar    whole body, joints   Deficiency, protein S (HCC)    Degenerative lumbar spinal stenosis     chronic back pain, left leg and foot   Diabetes mellitus (HCC)    diet controlled   DISH (diffuse idiopathic skeletal hyperostosis)    neck pain   Dyspnea    with exertion, wheezing occasionally   Erectile dysfunction    GERD (gastroesophageal reflux disease)    Gout    Headache    migraines-occasionally   History of DVT (deep vein thrombosis)    left leg-after left foot surgery   History of migraine headaches    History of pulmonary embolism    bilateral   HOH (hard of hearing)    wears aids   Hypercholesterolemia    Hypercoagulable state (HCC)    Hypertension    controlled on meds   Hypogonadism in male    Infection of skin    Iron deficiency anemia    Lower urinary tract symptoms    Meralgia paresthetica    Neck pain    Obesity    OSA on CPAP    Post-void dribbling    Prostatitis    Pulmonary embolism (HCC)    after carpal tunnel surgery   SNHL (sensorineural hearing loss)    Vertigo    occasional    Surgical History: Past Surgical History:  Procedure Laterality Date   Blood vessel Surgery     CARPAL TUNNEL RELEASE Bilateral 2011   CATARACT EXTRACTION W/PHACO Right 10/17/2017   Procedure: CATARACT EXTRACTION PHACO AND INTRAOCULAR LENS PLACEMENT (IOC)  RIGHT DIABETIC;  Surgeon: Lockie Mola, MD;  Location: [facility];  Service: Ophthalmology;  Laterality: Right;  CPAP Diet controlled diabetic   CATARACT EXTRACTION W/PHACO Left 11/23/2017   Procedure: CATARACT EXTRACTION PHACO AND INTRAOCULAR LENS PLACEMENT (IOC)  LEFT DIABETIC;  Surgeon: Lockie Mola, MD;  Location: [facility];  Service: Ophthalmology;  Laterality: Left;  Diabetic  sleep apnea   COLONOSCOPY WITH PROPOFOL N/A 07/11/2015   Procedure: COLONOSCOPY WITH PROPOFOL;  Surgeon: Christena Deem, MD;  Location: [facility] ENDOSCOPY;  Service: Endoscopy;  Laterality: N/A;   COLONOSCOPY WITH PROPOFOL N/A 02/02/2019   Procedure: COLONOSCOPY WITH PROPOFOL;  Surgeon: Christena Deem, MD;  Location: [facility] ENDOSCOPY;  Service: Endoscopy;  Laterality: N/A;   FOOT SURGERY Bilateral    FOOT SURGERY Left 2003   complicated by DVT   HEMORRHOID SURGERY  1984   HERNIA REPAIR Bilateral 1985   x2   HOLEP-LASER ENUCLEATION OF THE PROSTATE WITH MORCELLATION  08/28/2014   Dr. Vanna Scotland   HYDROCELE EXCISION / REPAIR  1985   x2   Hydrocelectomy     x 2   INNER EAR SURGERY     INSERTION OF MESH Right 10/01/2022   Procedure: INSERTION OF MESH;  Surgeon: Sung Amabile, DO;  Location: [facility] OR[facility];  Service: General;  Laterality: Right;   JOINT REPLACEMENT     left knee, right shoulder   LEG SURGERY Right 1974   fatty tumor   PROSTATE SURGERY     REPLACEMENT TOTAL KNEE Left 2012   SHOULDER SURGERY Right 1968   TONSILLECTOMY  1958   TOTAL SHOULDER ARTHROPLASTY Right 09/18/2014   arthroplasty, glenohumeral joint; total shoulder (glenoid and prosimal humeral replacement)   TUMOR EXCISION Right    fatty tumor   VASECTOMY  1979    Home Medications:  Allergies as of 06/02/2023       Reactions   Penicillins    Other reaction(s):unknown,as a child        Medication List        Accurate as of May 30, 2023  4:54 PM. If you have any questions, ask your nurse or doctor.          albuterol 108 (90 Base) MCG/ACT inhaler Commonly known as: VENTOLIN HFA Inhale 1-2 puffs into the lungs every 6 (six) hours as needed. Reported on 07/29/2015   cetirizine 10 MG chewable tablet Commonly known as: ZYRTEC Chew 10 mg by mouth daily as needed.   cyanocobalamin 1000 MCG tablet Commonly known as: VITAMIN B12 Take 1,000 mcg by mouth daily.   cyclobenzaprine 5 MG tablet Commonly known as: FLEXERIL Take 5 mg by mouth 3 (three) times daily as needed.   DULoxetine 60 MG capsule Commonly known as: CYMBALTA Take 60 mg by mouth every morning.   fluticasone 50 MCG/ACT nasal spray Commonly known as: FLONASE 2 sprays as needed.   gabapentin 300 MG capsule Commonly  known as: NEURONTIN Take 300 mg by mouth 4 (four) times daily.   HYDROcodone-acetaminophen 5-325 MG tablet Commonly known as: NORCO/VICODIN Take 1 tablet by mouth every 6 (six) hours.   ibuprofen 400 MG tablet Commonly known as: ADVIL Take 1 tablet (400 mg total) by mouth every 8 (eight) hours as needed for mild pain or moderate pain.   lisinopril-hydrochlorothiazide 10-12.5 MG tablet Commonly known as: ZESTORETIC Take 0.5 tablets by mouth every morning. am   metFORMIN 500 MG 24 hr tablet Commonly known as: GLUCOPHAGE-XR Take 1,000 mg by mouth every evening.  pantoprazole 40 MG tablet Commonly known as: PROTONIX 40 mg 2 (two) times daily.   pravastatin 20 MG tablet Commonly known as: PRAVACHOL Take 20 mg by mouth at bedtime.   propranolol 10 MG tablet Commonly known as: INDERAL Take 10 mg by mouth 4 (four) times daily as needed (Takes for Tremors).   psyllium 58.6 % packet Commonly known as: METAMUCIL Take 1 packet by mouth as needed.   tadalafil 20 MG tablet Commonly known as: CIALIS Take 1 tablet (20 mg total) by mouth daily as needed for erectile dysfunction.   tamsulosin 0.4 MG Caps capsule Commonly known as: FLOMAX TAKE 1 CAPSULE EVERY DAY   torsemide 5 MG tablet Commonly known as: DEMADEX Take 5 mg by mouth as needed (edema).   Vitamin D-3 125 MCG (5000 UT) Tabs Take 1 tablet by mouth daily at 6 (six) AM.   warfarin 5 MG tablet Commonly known as: COUMADIN Take 2.5-5 mg by mouth daily. 5 mg M, W, F and 2.5 mg on T, Th, Sat and Sun        Allergies:  Allergies  Allergen Reactions   Penicillins     Other reaction(s):unknown,as a child    Family History: Family History  Problem Relation Age of Onset   Thrombosis Other    Skin cancer Father    Kidney disease Neg Hx    Prostate cancer Neg Hx    Kidney cancer Neg Hx    Bladder Cancer Neg Hx     Social History:  reports that he has never smoked. He has never used smokeless tobacco. He reports  that he does not drink alcohol and does not use drugs.   Physical Exam: There were no vitals taken for this visit.  Constitutional:  Well nourished. Alert and oriented, No acute distress. HEENT: [place] AT, moist mucus membranes.  Trachea midline, no masses. Cardiovascular: No clubbing, cyanosis, or edema. Respiratory: Normal respiratory effort, no increased work of breathing. GI: Abdomen is soft, non tender, non distended, no abdominal masses. Liver and spleen not palpable.  No hernias appreciated.  Stool sample for occult testing is not indicated.   GU: No CVA tenderness.  No bladder fullness or masses.  Patient with circumcised/uncircumcised phallus. ***Foreskin easily retracted***  Urethral meatus is patent.  No penile discharge. No penile lesions or rashes. Scrotum without lesions, cysts, rashes and/or edema.  Testicles are located scrotally bilaterally. No masses are appreciated in the testicles. Left and right epididymis are normal. Rectal: Patient with  normal sphincter tone. Anus and perineum without scarring or rashes. No rectal masses are appreciated. Prostate is approximately *** grams, *** nodules are appreciated. Seminal vesicles are normal. Skin: No rashes, bruises or suspicious lesions. Lymph: No cervical or inguinal adenopathy. Neurologic: Grossly intact, no focal deficits, moving all 4 extremities. Psychiatric: Normal mood and affect.   Laboratory Data: ***  I have reviewed the labs.    Pertinent Imaging: N/A    Assessment & Plan:    1. Testosterone deficiency  -Testosterone level is subtherapeutic -FSH/LH ***  2. BPH with LUTS -PSA stable -no worsening of symptoms when discontinued the tamsulosin -continue conservative management, avoiding bladder irritants and timed voiding's -start tadalafil 10 mg daily  3. Erectile dysfunction:    -he will continue tadalafil 20 mg daily  No follow-ups on file.  Cloretta Ned   [facility] [address] [place], [place] [address] [phone]

## 2023-05-31 LAB — FSH/LH
FSH: <0.3 m[IU]/mL — ABNORMAL LOW (ref 1.5–12.4)
LH: <0.3 m[IU]/mL — ABNORMAL LOW (ref 1.7–8.6)

## 2023-05-31 LAB — TESTOSTERONE: Testosterone: 281 ng/dL (ref 264–916)

## 2023-06-01 ENCOUNTER — Ambulatory Visit: Payer: Medicare Other | Admitting: Urology

## 2023-06-02 ENCOUNTER — Ambulatory Visit: Payer: Medicare Other | Admitting: Urology

## 2023-06-02 DIAGNOSIS — E291 Testicular hypofunction: Secondary | ICD-10-CM

## 2023-06-02 DIAGNOSIS — N138 Other obstructive and reflux uropathy: Secondary | ICD-10-CM

## 2023-06-02 DIAGNOSIS — N529 Male erectile dysfunction, unspecified: Secondary | ICD-10-CM

## 2023-06-03 ENCOUNTER — Encounter: Payer: Self-pay | Admitting: Urology

## 2023-06-03 ENCOUNTER — Ambulatory Visit (INDEPENDENT_AMBULATORY_CARE_PROVIDER_SITE_OTHER): Payer: Medicare Other | Admitting: Urology

## 2023-06-03 VITALS — BP 109/64 | HR 82

## 2023-06-03 DIAGNOSIS — E291 Testicular hypofunction: Secondary | ICD-10-CM

## 2023-06-03 DIAGNOSIS — N401 Enlarged prostate with lower urinary tract symptoms: Secondary | ICD-10-CM | POA: Diagnosis not present

## 2023-06-03 DIAGNOSIS — N529 Male erectile dysfunction, unspecified: Secondary | ICD-10-CM

## 2023-06-03 DIAGNOSIS — N138 Other obstructive and reflux uropathy: Secondary | ICD-10-CM

## 2023-06-03 MED ORDER — TESTOSTERONE CYPIONATE 200 MG/ML IM SOLN: 200.0000 mg | INTRAMUSCULAR | 0 refills | Status: DC

## 2023-06-03 NOTE — Progress Notes (Signed)
06/03/23 11:10 AM   [name] [birth year] [id number]  Referring provider:  Luciana Axe, NP [address] [place],  [place] [address]  Urological history  1.  Testosterone deficiency -contributing factors of age, obesity, diabetes and chronic pain medications -testosterone level (05/2023) 281 -HCT/hemoglobin (03/2023) 13.4 -FSH/LH (05/2023) <0.3/<0.3 -failed Testopel  -insurance with not cover AVEED    2. BPH with LU TS -PSA (03/2023) 0.5 -s/p HoLEP 2016 -pathology of prostate chips negative -tadalafil 10 mg daily    3. ED -contributing factors of age, BPH, testosterone deficiency, DM, HTN, HLD, spinal injury, COPD, anticoagulation therapy and sleep apnea (sleeps with CPAP) -tadalafil 10 mg daily    5. Bilateral renal cysts -contrast CT 01/2021 - Bilateral renal cysts measuring up to 4 cm in the left mid kidney -stable on CT renal stone study 09/2021 - stable when compared to prior study    6. Bladder herniation -CT renal stone study 09/2021 - right inguinal hernia containing fat and small portion of the right anterior lateral bladder wall, unchanged when compared to prior CT in 2022  Chief Complaint  Patient presents with   Follow-up    HPI: Lance [name] is a 73 y.o.male who presents today to discuss alternative testosterone therapy as his insurance will not cover Aveed.  Previous records reviewed.   I PSS 16/2  No urinary complaints at this visit.  Patient denies any modifying or aggravating factors.  Patient denies any recent UTI's, gross hematuria, dysuria or suprapubic/flank pain.  Patient denies any fevers, chills, nausea or vomiting.     IPSS     Row Name 06/03/23 1000         International Prostate Symptom Score   How often have you had the sensation of not emptying your bladder? More than half the time     How often have you had to urinate less than every two hours? About half the time     How often have you found you stopped and  started again several times when you urinated? Less than half the time     How often have you found it difficult to postpone urination? About half the time     How often have you had a weak urinary stream? Less than half the time     How often have you had to strain to start urination? Less than 1 in 5 times     How many times did you typically get up at night to urinate? 1 Time     Total IPSS Score 16       Quality of Life due to urinary symptoms   If you were to spend the rest of your life with your urinary condition just the way it is now how would you feel about that? Mostly Satisfied               Score:  1-7 Mild 8-19 Moderate 20-35 Severe  SHIM 7  He no longer has spontaneous erections.  He does not have any pain with erections.  He does not have any curvature with erections.    SHIM     Row Name 06/03/23 1021         SHIM: Over the last 6 months:   How do you rate your confidence that you could get and keep an erection? Low     When you had erections with sexual stimulation, how often were your erections hard enough for penetration (entering your partner)?  A Few Times (much less than half the time)     During sexual intercourse, how often were you able to maintain your erection after you had penetrated (entered) your partner? Almost Never or Never     During sexual intercourse, how difficult was it to maintain your erection to completion of intercourse? Extremely Difficult     When you attempted sexual intercourse, how often was it satisfactory for you? Almost Never or Never       SHIM Total Score   SHIM 7              Score: 1-7 Severe ED 8-11 Moderate ED 12-16 Mild-Moderate ED 17-21 Mild ED 22-25 No ED   PMH: Past Medical History:  Diagnosis Date   Acute cystitis    Allergic rhinitis    BPH (benign prostatic hyperplasia)    Bronchitis    chronic   Carpal tunnel syndrome    CHF (congestive heart failure) (HCC)    Complication of anesthesia     delayed emergence after fatty tumor surgery   DDD (degenerative disc disease), lumbar    whole body, joints   Deficiency, protein S (HCC)    Degenerative lumbar spinal stenosis    chronic back pain, left leg and foot   Diabetes mellitus (HCC)    diet controlled   DISH (diffuse idiopathic skeletal hyperostosis)    neck pain   Dyspnea    with exertion, wheezing occasionally   Erectile dysfunction    GERD (gastroesophageal reflux disease)    Gout    Headache    migraines-occasionally   History of DVT (deep vein thrombosis)    left leg-after left foot surgery   History of migraine headaches    History of pulmonary embolism    bilateral   HOH (hard of hearing)    wears aids   Hypercholesterolemia    Hypercoagulable state (HCC)    Hypertension    controlled on meds   Hypogonadism in male    Infection of skin    Iron deficiency anemia    Lower urinary tract symptoms    Meralgia paresthetica    Neck pain    Obesity    OSA on CPAP    Post-void dribbling    Prostatitis    Pulmonary embolism (HCC)    after carpal tunnel surgery   SNHL (sensorineural hearing loss)    Vertigo    occasional    Surgical History: Past Surgical History:  Procedure Laterality Date   Blood vessel Surgery     CARPAL TUNNEL RELEASE Bilateral 2011   CATARACT EXTRACTION W/PHACO Right 10/17/2017   Procedure: CATARACT EXTRACTION PHACO AND INTRAOCULAR LENS PLACEMENT (IOC)  RIGHT DIABETIC;  Surgeon: Lockie Mola, MD;  Location: [facility];  Service: Ophthalmology;  Laterality: Right;  CPAP Diet controlled diabetic   CATARACT EXTRACTION W/PHACO Left 11/23/2017   Procedure: CATARACT EXTRACTION PHACO AND INTRAOCULAR LENS PLACEMENT (IOC)  LEFT DIABETIC;  Surgeon: Lockie Mola, MD;  Location: [facility];  Service: Ophthalmology;  Laterality: Left;  Diabetic  sleep apnea   COLONOSCOPY WITH PROPOFOL N/A 07/11/2015   Procedure: COLONOSCOPY WITH PROPOFOL;  Surgeon: Christena Deem, MD;  Location[facility] END[facility];  Service: Endoscopy;  Laterality: N/A;   COLONOSCOPY WITH PROPOFOL N/A 02/02/2019   Procedure: COLONOSCOPY WITH PROPOFOL;  Surgeon: Christena Deem, MD;  Location: [facility] ENDOSCOPY;  Service: Endoscopy;  Laterality: N/A;   FOOT SURGERY Bilateral    FOOT SURGERY Left 2003   complicated by  DVT   HEMORRHOID SURGERY  1984   HERNIA REPAIR Bilateral 1985   x2   HOLEP-LASER ENUCLEATION OF THE PROSTATE WITH MORCELLATION  08/28/2014   Dr. Vanna Scotland   HYDROCELE EXCISION / REPAIR  1985   x2   Hydrocelectomy     x 2   INNER EAR SURGERY     INSERTION OF MESH Right 10/01/2022   Procedure: INSERTION OF MESH;  Surgeon: Sung Amabile, DO;  Location: [facility] ORS;  Service: General;  Laterality: Right;   JOINT REPLACEMENT     left knee, right shoulder   LEG SURGERY Right 1974   fatty tumor   PROSTATE SURGERY     REPLACEMENT TOTAL KNEE Left 2012   SHOULDER SURGERY Right 1968   TONSILLECTOMY  1958   TOTAL SHOULDER ARTHROPLASTY Right 09/18/2014   arthroplasty, glenohumeral joint; total shoulder (glenoid and prosimal humeral replacement)   TUMOR EXCISION Right    fatty tumor   VASECTOMY  1979    Home Medications:  Allergies as of 06/03/2023       Reactions   Penicillins    Other reaction(s):unknown,as a child        Medication List        Accurate as of June 03, 2023 11:10 AM. If you have any questions, ask your nurse or doctor.          albuterol 108 (90 Base) MCG/ACT inhaler Commonly known as: VENTOLIN HFA Inhale 1-2 puffs into the lungs every 6 (six) hours as needed. Reported on 07/29/2015   cetirizine 10 MG chewable tablet Commonly known as: ZYRTEC Chew 10 mg by mouth daily as needed.   cyanocobalamin 1000 MCG tablet Commonly known as: VITAMIN B12 Take 1,000 mcg by mouth daily.   cyclobenzaprine 5 MG tablet Commonly known as: FLEXERIL Take 5 mg by mouth 3 (three) times daily as needed.   DULoxetine 60 MG capsule Commonly  known as: CYMBALTA Take 60 mg by mouth every morning.   fluticasone 50 MCG/ACT nasal spray Commonly known as: FLONASE 2 sprays as needed.   gabapentin 300 MG capsule Commonly known as: NEURONTIN Take 300 mg by mouth 4 (four) times daily.   HYDROcodone-acetaminophen 5-325 MG tablet Commonly known as: NORCO/VICODIN Take 1 tablet by mouth every 6 (six) hours.   ibuprofen 400 MG tablet Commonly known as: ADVIL Take 1 tablet (400 mg total) by mouth every 8 (eight) hours as needed for mild pain or moderate pain.   lisinopril-hydrochlorothiazide 10-12.5 MG tablet Commonly known as: ZESTORETIC Take 0.5 tablets by mouth every morning. am   metFORMIN 500 MG 24 hr tablet Commonly known as: GLUCOPHAGE-XR Take 1,000 mg by mouth every evening.   pantoprazole 40 MG tablet Commonly known as: PROTONIX 40 mg 2 (two) times daily.   pravastatin 20 MG tablet Commonly known as: PRAVACHOL Take 20 mg by mouth at bedtime.   propranolol 10 MG tablet Commonly known as: INDERAL Take 10 mg by mouth 4 (four) times daily as needed (Takes for Tremors).   psyllium 58.6 % packet Commonly known as: METAMUCIL Take 1 packet by mouth as needed.   tadalafil 20 MG tablet Commonly known as: CIALIS Take 1 tablet (20 mg total) by mouth daily as needed for erectile dysfunction.   tamsulosin 0.4 MG Caps capsule Commonly known as: FLOMAX TAKE 1 CAPSULE EVERY DAY   testosterone cypionate 200 MG/ML injection Commonly known as: DEPOTESTOSTERONE CYPIONATE Inject 1 mL (200 mg total) into the muscle every 14 (fourteen) days.   torsemide  5 MG tablet Commonly known as: DEMADEX Take 5 mg by mouth as needed (edema).   Vitamin D-3 125 MCG (5000 UT) Tabs Take 1 tablet by mouth daily at 6 (six) AM.   warfarin 5 MG tablet Commonly known as: COUMADIN Take 2.5-5 mg by mouth daily. 5 mg M, W, F and 2.5 mg on T, Th, Sat and Sun        Allergies:  Allergies  Allergen Reactions   Penicillins     Other  reaction(s):unknown,as a child    Family History: Family History  Problem Relation Age of Onset   Thrombosis Other    Skin cancer Father    Kidney disease Neg Hx    Prostate cancer Neg Hx    Kidney cancer Neg Hx    Bladder Cancer Neg Hx     Social History:  reports that he has never smoked. He has never used smokeless tobacco. He reports that he does not drink alcohol and does not use drugs.   Physical Exam: BP 109/64   Pulse 82   Constitutional:  Well nourished. Alert and oriented, No acute distress. HEENT: [place] AT, moist mucus membranes.  Trachea midline, no masses. Cardiovascular: No clubbing, cyanosis, or edema. Respiratory: Normal respiratory effort, no increased work of breathing. Neurologic: Grossly intact, no focal deficits, moving all 4 extremities. Psychiatric: Normal mood and affect.   Laboratory Data: Results for orders placed or performed in visit on 05/30/23  Testosterone   Collection Time: 05/30/23  8:21 AM  Result Value Ref Range   Testosterone 281 264 - 916 ng/dL  FSH/LH   Collection Time: 05/30/23  8:21 AM  Result Value Ref Range   LH <0.3 (L) 1.7 - 8.6 mIU/mL   FSH <0.3 (L) 1.5 - 12.4 mIU/mL  I have reviewed the labs.    Pertinent Imaging: N/A    Assessment & Plan:    1. Hypogonadism  -Testosterone level is subtherapeutic -FSH/LH <0.3 -We discussed different treatment modalities for testosterone replacement therapy and testosterone cypionate seems to be the most cost effective while paying out-of-pocket -I will [facility] a [facility]pionate [facility] and hopefully he will receive a 10 mL vial for ~ $40 out of pocket cost -He will return next week so I can instruct him how to administer the testosterone cypionate IM  2. BPH with LUTS -PSA stable -continue conservative management, avoiding bladder irritants and timed voiding's -Continue tamsulosin 0.4 mg daily  3. Erectile dysfunction:    -he will continue tadalafil 20 mg  daily  Return in about 1 week (around 06/10/2023) for Testosterone cypionate injection instructions.  Cloretta Ned   [facility] [address], [address] [place], [place] [address] [phone]

## 2023-06-09 NOTE — Progress Notes (Signed)
Lance [name] is a 73 y.o.  male with testosterone deficiency who presents today for instruction on delivering an IM injection of testosterone cypionate.    I instructed the patient to identify the concentration of his testosterone. Testosterone for injection is usually in the form of testosterone cypionate. These liquids come in multiple concentrations, so before giving an injection, it's very important to make sure that his intended dosage takes into account the concentration of the testosterone serum. Usually, testosterone comes in a concentration of either 100 mg/ml or 200 mg/ml.  We typically use the 200 mg/mL in this office.    Using a sterile, 18 G needle and 3 cc syringe, the testosterone cypionate was drawn up for a 1 cc injection.  To draw up the dose, I demonstrated how to first draw air into the syringe equal to the volume of the dosage. Then, wipe the top of the medication bottle with an alcohol wipe, insert the needle through the lid and into the medication, and push the air from your syringe into the bottle. Turn the bottle upside down and draw out the exact dosage of testosterone.  I demonstrated how to aspirate the syringe by hinge the syringe with its needle uncapped and pointing up in front of him.  Looking for air bubbles in the syringe. Flick the side of the syringe to get these bubbles to rise to the top.    When the dosage is bubble-free, I slowly depressed the plunger to force the air at the top of the syringe out stopping when a tiny drop of medication comes out of the tip of the syringe.  I advised him to be certain no air remained in the syringe as injecting air is very dangerous.  Being careful not to squirt or spray a significant portion of the dosage onto the floor.  Preparing the injection site, outer middle third of the vastus lateralis muscle of the thigh, I took a sterile alcohol pad and wipe the immediate area around where I intended him to inject.   I then  demonstrated how to change the needle from the 18 G to the 21 G needle.  I then gave him the syringe for injecting.  He correctly identified the injection location.  He held the syringe like a dart at a 90-degree angle above the sterile injection site. Quickly plunged it into the flesh. Before depressing the plunger, he drew back on it slightly and no blood was seen.  I advised him that if blood flashed in the syringe, he would need to remove the needle and then select a different location as he was in the vein.  Inject the medication at a steady, controlled pace.  He fully depressed the plunger, slowly pull the needle out. Pressing around the injection site with a sterile cotton swab as he did so - this preventing the emerging needle from pulling on the skin and causing extra pain.  We assessed the needle entry point for bleeding, and applied a sterile Band-Aid and/or cotton swab if needed. Disposed of the used needle and syringe in a proper sharps container.  I advised him to acquire a sharps container for his personal use at home.  I advised him that If, after injection, he experienced redness, swelling, or discomfort beyond that of normal soreness at the site of injection, call our office for an appointment and instructions.  He is to always store his medication at the recommended temperature, and always check the expiration date  on the bottle. If it's expired, don't use it.  Of course, keep all of med's out of reach of children.  Do not change his dose without consulting your provider.  His starting dose will be 1 cc every 2 weeks.  He will return 1 week after his fourth injection for a testosterone level, hemoglobin and hematocrit  Halaina Vanduzer, PA-C  I spent 15 minutes on the day of the encounter to include pre-visit record review, face-to-face time with the patient, and post-visit ordering of tests.

## 2023-06-10 ENCOUNTER — Ambulatory Visit (INDEPENDENT_AMBULATORY_CARE_PROVIDER_SITE_OTHER): Payer: Medicare Other | Admitting: Urology

## 2023-06-10 VITALS — BP 116/66 | HR 81

## 2023-06-10 DIAGNOSIS — E291 Testicular hypofunction: Secondary | ICD-10-CM

## 2023-06-10 MED ORDER — BD DISP NEEDLES 18G X 1-1/2" MISC: 1.0000 mg | 0 refills | Status: AC

## 2023-06-10 MED ORDER — SYRINGE 2-3 ML 3 ML MISC: 1.0000 mg | 3 refills | Status: AC

## 2023-06-10 MED ORDER — BD DISP NEEDLES 21G X 1-1/2" MISC: 1.0000 mg | 0 refills | Status: AC

## 2023-06-29 ENCOUNTER — Other Ambulatory Visit: Payer: Self-pay | Admitting: Orthopedic Surgery

## 2023-07-05 ENCOUNTER — Ambulatory Visit (INDEPENDENT_AMBULATORY_CARE_PROVIDER_SITE_OTHER): Payer: Medicare Other | Admitting: Vascular Surgery

## 2023-07-05 ENCOUNTER — Encounter (INDEPENDENT_AMBULATORY_CARE_PROVIDER_SITE_OTHER): Payer: Self-pay | Admitting: Vascular Surgery

## 2023-07-05 VITALS — BP 140/63 | HR 67 | Resp 18 | Ht 72.0 in | Wt 308.0 lb

## 2023-07-05 DIAGNOSIS — E1169 Type 2 diabetes mellitus with other specified complication: Secondary | ICD-10-CM | POA: Diagnosis not present

## 2023-07-05 DIAGNOSIS — I10 Essential (primary) hypertension: Secondary | ICD-10-CM | POA: Diagnosis not present

## 2023-07-05 DIAGNOSIS — I890 Lymphedema, not elsewhere classified: Secondary | ICD-10-CM | POA: Diagnosis not present

## 2023-07-05 DIAGNOSIS — I2699 Other pulmonary embolism without acute cor pulmonale: Secondary | ICD-10-CM

## 2023-07-05 NOTE — Assessment & Plan Note (Signed)
 The patient has severe, stage II-III lymphedema with swelling refractory to continued use of compression socks, elevation, and activity.  His significant swelling is causing impairments on activities of daily living.  He has a lymphedema pump but it is nonfunctional.  We need to get a new lymphedema pump for him as he would clearly benefit from this.

## 2023-07-05 NOTE — Assessment & Plan Note (Signed)
 blood glucose control important in reducing the progression of atherosclerotic disease. Also, involved in wound healing. On appropriate medications.

## 2023-07-05 NOTE — Patient Instructions (Signed)
Inferior Vena Cava Filter Insertion  Inferior vena cava filter insertion is a procedure in which a filter is placed into the large vein in the abdomen that carries blood from the lower part of the body to the heart. This vein is called the inferior vena cava (IVC). The inserted filter helps to prevent blood clots in the legs or pelvis from traveling to the lungs, which can be very dangerous. The filter is a small, metal device that is shaped like the spokes of an umbrella. The filter is inserted through a pathway that is created in the neck or groin. Filters are inserted when blood thinners (anticoagulants) cannot be used to prevent blood clots from forming. You may need filters rather than anticoagulants if you have: Severe platelet problems or shortages. Recent or current major bleeding that cannot be treated. Bleeding associated with anticoagulants. Bleeding in your head. Recent surgery or impending surgery. Tell a health care provider about: Any allergies you have, including iodine. All medicines you are taking, including vitamins, herbs, eye drops, creams, and over-the-counter medicines. Any problems you or family members have had with anesthetic medicines or with contrast dyes that are used during imaging tests. Any bleeding problems you have. Any surgeries you have had. Any medical conditions you have. Whether you are pregnant or may be pregnant. What are the risks? Generally, this is a safe procedure. However, problems may occur, including: Filter problems such as: The filter blocking the inferior vena cava. This can cause leg swelling. The filter not working properly as it gets older. The filter moving and traveling to the heart or lungs. Allergic reactions to medicines or dyes. Damage to nearby structures or organs. Infection. Bleeding, such as a pool of blood (hematoma) around the site where a long, thin tube is put into a large vein (catheter insertion site). Blood clot in  lower extremities called a deep vein thrombosis (DVT). What happens before the procedure? When to stop eating and drinking Follow instructions from your health care provider about what you may eat and drink before your procedure. These may include: 8 hours before your procedure Stop eating most foods. Do not eat meat, fried foods, or fatty foods. Eat only light foods, such as toast or crackers. All liquids are okay except energy drinks and alcohol. 6 hours before your procedure Stop eating. Drink only clear liquids, such as water, clear fruit juice, black coffee, plain tea, and sports drinks. Do not drink energy drinks or alcohol. 2 hours before your procedure Stop drinking all liquids. You may be allowed to take medicines with small sips of water. If you do not follow your health care provider's instructions, your procedure may be delayed or canceled. Medicines Ask your health care provider about: Changing or stopping your regular medicines. This is especially important if you are taking diabetes medicines or blood thinners. Taking medicines such as aspirin and ibuprofen. These medicines can thin your blood. Do not take these medicines unless your health care provider tells you to take them. Taking over-the-counter medicines, vitamins, herbs, and supplements. Surgery safety Ask your health care provider: How your surgery site will be marked. What steps will be taken to help prevent infection. These steps may include: Removing hair at the surgery site. Washing skin with a germ-killing soap. Receiving antibiotic medicine. General instructions You may have blood tests. These tests can help to tell how well your kidneys and liver are working. They can also show how fast your blood is clotting. Do not use any  products that contain nicotine or tobacco for at least 4 weeks before the procedure. These products include cigarettes, chewing tobacco, and vaping devices, such as e-cigarettes. If  you need help quitting, ask your health care provider. If you will be going home right after the procedure, plan to have a responsible adult: Take you home from the hospital or clinic. You will not be allowed to drive. Care for you for the time you are told. What happens during the procedure? An IV will be inserted into one of your veins. You will be given one or more of the following: A medicine to help you relax (sedative). A medicine to numb the area (local anesthetic). A medicine to make you fall asleep (general anesthetic). The procedure is done through a large vein in your neck or groin that leads to the IVC. An incision will be made over the vein. A long, thin tube (catheter) will be put into the vein. Contrast dye may be injected into the IVC to help guide the catheter. X-rays may be used to make sure that the catheter is in the correct position. The IVC filter will be inserted into the vein through the catheter until it reaches the correct location in the inferior vena cava. The catheter will be removed through the insertion site in your skin. Pressure will be applied to the insertion site to stop bleeding. A bandage (dressing) may be applied over the catheter insertion site. Your IV will be taken out. The procedure may vary among health care providers and hospitals. What happens after the procedure? Your blood pressure, heart rate, breathing rate, and blood oxygen level will be monitored until you leave the hospital or clinic. You may need to stay in bed (be on bed rest) for a period of time. Your insertion site will be monitored for the first few hours for any signs of bleeding. If you were given a sedative during the procedure, it can affect you for several hours. Do not drive or operate machinery until your health care provider says that it is safe. Summary The inferior vena cava filter helps to prevent blood clots in the legs or pelvis from traveling to your lungs. The IVC  filter is inserted when anticoagulants cannot be used to prevent blood clots. Plan to have a responsible adult care for you for the time you are told. This information is not intended to replace advice given to you by your health care provider. Make sure you discuss any questions you have with your health care provider. Document Revised: 05/12/2021 Document Reviewed: 05/12/2021 Elsevier Patient Education  2024 [employer].

## 2023-07-05 NOTE — Assessment & Plan Note (Signed)
 Previous history of pulmonary embolus even after carpal tunnel surgery.  He would be at extremely high risk of thromboembolic complications with a major orthopedic surgery.  As such, a an IVC filter would be indicated to avoid pulmonary embolus in the perioperative setting.  This could then be removed approximately 2 months after his orthopedic surgery if no new acute DVT is seen on a postoperative duplex.  I have discussed the risks and benefits of the procedure with the patient.  I discussed the reason and rationale for placement of the IVC filter.  He voices his understanding and is agreeable to proceed.

## 2023-07-05 NOTE — Progress Notes (Signed)
 MRN : [id number]  Lance [name] is a 73 y.o. (03-Nov-1950) male who presents with chief complaint of  Chief Complaint  Patient presents with   New Patient (Initial Visit)    evaluate for possible IVC filter. aberman, zachary  .  History of Present Illness: Patient returns today on referral from Dr. Audelia Acton.  We have previously seen him for lymphedema back in 2023.  He had a lymphedema pump but currently this is not functional and he needs a new pump.  The main reason that Dr. Audelia Acton referred him today was he has an upcoming major orthopedic surgery.  He has factor V Leiden disease and has had postoperative blood clots in the past as has his twin brother.  He even had pulmonary embolus after a carpal tunnel surgery.  He has chronic leg swelling which is ongoing and significant.  This is associated with significant hyperpigmentation and stasis changes worse on the left leg than the right.  No current open wounds of the lower extremities.  The pain and swelling in the legs is a daily problem.  Current Outpatient Medications  Medication Sig Dispense Refill   cetirizine (ZYRTEC) 10 MG tablet Take 10 mg by mouth daily as needed for allergies.     Cholecalciferol (VITAMIN D-3) 125 MCG (5000 UT) TABS Take 5,000 Units by mouth daily.     cyanocobalamin (VITAMIN B12) 1000 MCG tablet Take 1,000 mcg by mouth daily.     cyclobenzaprine (FLEXERIL) 5 MG tablet Take 5 mg by mouth 3 (three) times daily as needed for muscle spasms.     DULoxetine (CYMBALTA) 60 MG capsule Take 60 mg by mouth every morning.     fluticasone (FLONASE) 50 MCG/ACT nasal spray Place 2 sprays into both nostrils daily as needed for allergies.     gabapentin (NEURONTIN) 300 MG capsule Take 300 mg by mouth 4 (four) times daily.      HYDROcodone-acetaminophen (NORCO/VICODIN) 5-325 MG tablet Take 1 tablet by mouth every 6 (six) hours. 6 tablet 0   lisinopril (ZESTRIL) 5 MG tablet Take 5 mg by mouth daily.     metFORMIN  (GLUCOPHAGE-XR) 500 MG 24 hr tablet Take 1,000 mg by mouth every evening.     NEEDLE, DISP, 18 G (BD DISP NEEDLES) 18G X 1-1/2" MISC 1 mg by Does not apply route every 14 (fourteen) days. 50 each 0   NEEDLE, DISP, 21 G (BD DISP NEEDLES) 21G X 1-1/2" MISC 1 mg by Does not apply route every 14 (fourteen) days. 50 each 0   pantoprazole (PROTONIX) 40 MG tablet Take 40 mg by mouth 2 (two) times daily.     pravastatin (PRAVACHOL) 20 MG tablet Take 20 mg by mouth at bedtime.     propranolol (INDERAL) 10 MG tablet Take 10 mg by mouth 4 (four) times daily as needed (Takes for Tremors).     Syringe, Disposable, (2-3CC SYRINGE) 3 ML MISC 1 mg by Does not apply route every 14 (fourteen) days. 25 each 3   tadalafil (CIALIS) 20 MG tablet Take 1 tablet (20 mg total) by mouth daily as needed for erectile dysfunction. 30 tablet 3   testosterone cypionate (DEPOTESTOSTERONE CYPIONATE) 200 MG/ML injection Inject 1 mL (200 mg total) into the muscle every 14 (fourteen) days. 10 mL 0   warfarin (COUMADIN) 5 MG tablet Take 2.5-5 mg by mouth daily. 5 mg M, W, F and 2.5 mg on T, Th, Sat and Sun     No current facility-administered medications  for this visit.    Past Medical History:  Diagnosis Date   Acute cystitis    Allergic rhinitis    BPH (benign prostatic hyperplasia)    Bronchitis    chronic   Carpal tunnel syndrome    CHF (congestive heart failure) (HCC)    Complication of anesthesia    delayed emergence after fatty tumor surgery   DDD (degenerative disc disease), lumbar    whole body, joints   Deficiency, protein S (HCC)    Degenerative lumbar spinal stenosis    chronic back pain, left leg and foot   Diabetes mellitus (HCC)    diet controlled   DISH (diffuse idiopathic skeletal hyperostosis)    neck pain   Dyspnea    with exertion, wheezing occasionally   Erectile dysfunction    GERD (gastroesophageal reflux disease)    Gout    Headache    migraines-occasionally   History of DVT (deep vein  thrombosis)    left leg-after left foot surgery   History of migraine headaches    History of pulmonary embolism    bilateral   HOH (hard of hearing)    wears aids   Hypercholesterolemia    Hypercoagulable state (HCC)    Hypertension    controlled on meds   Hypogonadism in male    Infection of skin    Iron deficiency anemia    Lower urinary tract symptoms    Meralgia paresthetica    Neck pain    Obesity    OSA on CPAP    Post-void dribbling    Prostatitis    Pulmonary embolism (HCC)    after carpal tunnel surgery   SNHL (sensorineural hearing loss)    Vertigo    occasional    Past Surgical History:  Procedure Laterality Date   Blood vessel Surgery     CARPAL TUNNEL RELEASE Bilateral 2011   CATARACT EXTRACTION W/PHACO Right 10/17/2017   Procedure: CATARACT EXTRACTION PHACO AND INTRAOCULAR LENS PLACEMENT (IOC)  RIGHT DIABETIC;  Surgeon: Lockie Mola, MD;  Location: [facility];  Service: Ophthalmology;  Laterality: Right;  CPAP Diet controlled diabetic   CATARACT EXTRACTION W/PHACO Left 11/23/2017   Procedure: CATARACT EXTRACTION PHACO AND INTRAOCULAR LENS PLACEMENT (IOC)  LEFT DIABETIC;  Surgeon: Lockie Mola, MD;  Location: [facility];  Service: Ophthalmology;  Laterality: Left;  Diabetic  sleep apnea   COLONOSCOPY WITH PROPOFOL N/A 07/11/2015   Procedure: COLONOSCOPY WITH PROPOFOL;  Surgeon: Christena Deem, MD;  Location: [facility];  Service: Endoscopy;  Laterality: N/A;   COLONOSCOPY WITH PROPOFOL N/A 02/02/2019   Procedure: COLONOSCOPY WITH PROPOFOL;  Surgeon: Christena Deem, MD;  Location: [facility] END[facility]COPY;  Service: Endoscopy;  Laterality: N/A;   FOOT SURGERY Bilateral    FOOT SURGERY Left 2003   complicated by DVT   HEMORRHOID SURGERY  1984   HERNIA REPAIR Bilateral 1985   x2   HOLEP-LASER ENUCLEATION OF THE PROSTATE WITH MORCELLATION  08/28/2014   Dr. Vanna Scotland   HYDROCELE EXCISION / REPAIR  1985   x2    Hydrocelectomy     x 2   INNER EAR SURGERY     INSERTION OF MESH Right 10/01/2022   Procedure: INSERTION OF MESH;  Surgeon: Sung Amabile, DO;  Location: [facility];  Service: General;  Laterality: Right;   JOINT REPLACEMENT     left knee, right shoulder   LEG SURGERY Right 1974   fatty tumor   PROSTATE SURGERY     REPLACEMENT  TOTAL KNEE Left 2012   SHOULDER SURGERY Right 1968   TONSILLECTOMY  1958   TOTAL SHOULDER ARTHROPLASTY Right 09/18/2014   arthroplasty, glenohumeral joint; total shoulder (glenoid and prosimal humeral replacement)   TUMOR EXCISION Right    fatty tumor   VASECTOMY  1979     Social History   Tobacco Use   Smoking status: Never   Smokeless tobacco: Never  Vaping Use   Vaping status: Never Used  Substance Use Topics   Alcohol use: No    Alcohol/week: 0.0 standard drinks of alcohol   Drug use: Never       Family History  Problem Relation Age of Onset   Thrombosis Other    Skin cancer Father    Kidney disease Neg Hx    Prostate cancer Neg Hx    Kidney cancer Neg Hx    Bladder Cancer Neg Hx   Brother with DVT and Factor V Leiden  Allergies  Allergen Reactions   Penicillins     Other reaction(s):unknown,as a child     REVIEW OF SYSTEMS (Negative unless checked)  Constitutional: [] Weight loss  [] Fever  [] Chills Cardiac: [] Chest pain   [] Chest pressure   [] Palpitations   [] Shortness of breath when laying flat   [] Shortness of breath at rest   [] Shortness of breath with exertion. Vascular:  [] Pain in legs with walking   [] Pain in legs at rest   [] Pain in legs when laying flat   [] Claudication   [] Pain in feet when walking  [] Pain in feet at rest  [] Pain in feet when laying flat   [x] History of DVT   [x] Phlebitis   [x] Swelling in legs   [] Varicose veins   [] Non-healing ulcers Pulmonary:   [] Uses home oxygen   [] Productive cough   [] Hemoptysis   [] Wheeze  [] COPD   [] Asthma Neurologic:  [] Dizziness  [] Blackouts   [] Seizures   [] History of stroke    [] History of TIA  [] Aphasia   [] Temporary blindness   [] Dysphagia   [] Weakness or numbness in arms   [] Weakness or numbness in legs Musculoskeletal:  [x] Arthritis   [] Joint swelling   [x] Joint pain   [] Low back pain Hematologic:  [] Easy bruising  [] Easy bleeding   [x] Hypercoagulable state   [] Anemic   Gastrointestinal:  [] Blood in stool   [] Vomiting blood  [] Gastroesophageal reflux/heartburn   [] Abdominal pain Genitourinary:  [] Chronic kidney disease   [] Difficult urination  [] Frequent urination  [] Burning with urination   [] Hematuria Skin:  [] Rashes   [] Ulcers   [] Wounds Psychological:  [] History of anxiety   []  History of major depression.  Physical Examination  BP (!) 140/63   Pulse 67   Resp 18   Ht 6' (1.829 m)   Wt (!) 308 lb (139.7 kg)   BMI 41.77 kg/m  Gen:  WD/WN, NAD. Obese  Head: /AT, No temporalis wasting. Ear/Nose/Throat: Hearing grossly intact, nares w/o erythema or drainage Eyes: Conjunctiva clear. Sclera non-icteric Neck: Supple.  Trachea midline Pulmonary:  Good air movement, no use of accessory muscles.  Cardiac: RRR, no JVD Vascular:  Vessel Right Left  Radial Palpable Palpable                       Musculoskeletal: M/S 5/5 throughout.  No deformity or atrophy. 1+ RLE edema, 2+ LLE edema.  Significant stasis dermatitis and hyperpigmentation worse on the left than the right. Neurologic: Sensation grossly intact in extremities.  Symmetrical.  Speech is fluent.  Psychiatric: Judgment intact,  Mood & affect appropriate for pt's clinical situation. Dermatologic: No rashes or ulcers noted.  No cellulitis or open wounds.      Labs Recent Results (from the past 2160 hours)  Testosterone     Status: None   Collection Time: 05/30/23  8:21 AM  Result Value Ref Range   Testosterone 281 264 - 916 ng/dL    Comment: Adult male reference interval is based on a population of healthy nonobese males (BMI <30) between 75 and 87 years old. Travison, et.al. JCEM  [phone]. PMID: [id number].   FSH/LH     Status: Abnormal   Collection Time: 05/30/23  8:21 AM  Result Value Ref Range   LH <0.3 (L) 1.7 - 8.6 mIU/mL   FSH <0.3 (L) 1.5 - 12.4 mIU/mL    Radiology No results found.  Assessment/Plan  Pulmonary embolism and infarction [facility]) Previous history of pulmonary embolus even after carpal tunnel surgery.  He would be at extremely high risk of thromboembolic complications with a major orthopedic surgery.  As such, a an IVC filter would be indicated to avoid pulmonary embolus in the perioperative setting.  This could then be removed approximately 2 months after his orthopedic surgery if no new acute DVT is seen on a postoperative duplex.  I have discussed the risks and benefits of the procedure with the patient.  I discussed the reason and rationale for placement of the IVC filter.  He voices his understanding and is agreeable to proceed.  Essential hypertension blood pressure control important in reducing the progression of atherosclerotic disease. On appropriate oral medications.   Type 2 diabetes mellitus with other specified complication (HCC) blood glucose control important in reducing the progression of atherosclerotic disease. Also, involved in wound healing. On appropriate medications.   Lymphedema The patient has severe, stage II-III lymphedema with swelling refractory to continued use of compression socks, elevation, and activity.  His significant swelling is causing impairments on activities of daily living.  He has a lymphedema pump but it is nonfunctional.  We need to get a new lymphedema pump for him as he would clearly benefit from this.    [name], MD  07/05/2023 1:54 PM    This note was created with Dragon medical transcription system.  Any errors from dictation are purely unintentional

## 2023-07-05 NOTE — H&P (View-Only) (Signed)
 MRN : [id number]  Lance [name] is a 73 y.o. (03-Nov-1950) male who presents with chief complaint of  Chief Complaint  Patient presents with   New Patient (Initial Visit)    evaluate for possible IVC filter. aberman, zachary  .  History of Present Illness: Patient returns today on referral from Dr. Audelia Acton.  We have previously seen him for lymphedema back in 2023.  He had a lymphedema pump but currently this is not functional and he needs a new pump.  The main reason that Dr. Audelia Acton referred him today was he has an upcoming major orthopedic surgery.  He has factor V Leiden disease and has had postoperative blood clots in the past as has his twin brother.  He even had pulmonary embolus after a carpal tunnel surgery.  He has chronic leg swelling which is ongoing and significant.  This is associated with significant hyperpigmentation and stasis changes worse on the left leg than the right.  No current open wounds of the lower extremities.  The pain and swelling in the legs is a daily problem.  Current Outpatient Medications  Medication Sig Dispense Refill   cetirizine (ZYRTEC) 10 MG tablet Take 10 mg by mouth daily as needed for allergies.     Cholecalciferol (VITAMIN D-3) 125 MCG (5000 UT) TABS Take 5,000 Units by mouth daily.     cyanocobalamin (VITAMIN B12) 1000 MCG tablet Take 1,000 mcg by mouth daily.     cyclobenzaprine (FLEXERIL) 5 MG tablet Take 5 mg by mouth 3 (three) times daily as needed for muscle spasms.     DULoxetine (CYMBALTA) 60 MG capsule Take 60 mg by mouth every morning.     fluticasone (FLONASE) 50 MCG/ACT nasal spray Place 2 sprays into both nostrils daily as needed for allergies.     gabapentin (NEURONTIN) 300 MG capsule Take 300 mg by mouth 4 (four) times daily.      HYDROcodone-acetaminophen (NORCO/VICODIN) 5-325 MG tablet Take 1 tablet by mouth every 6 (six) hours. 6 tablet 0   lisinopril (ZESTRIL) 5 MG tablet Take 5 mg by mouth daily.     metFORMIN  (GLUCOPHAGE-XR) 500 MG 24 hr tablet Take 1,000 mg by mouth every evening.     NEEDLE, DISP, 18 G (BD DISP NEEDLES) 18G X 1-1/2" MISC 1 mg by Does not apply route every 14 (fourteen) days. 50 each 0   NEEDLE, DISP, 21 G (BD DISP NEEDLES) 21G X 1-1/2" MISC 1 mg by Does not apply route every 14 (fourteen) days. 50 each 0   pantoprazole (PROTONIX) 40 MG tablet Take 40 mg by mouth 2 (two) times daily.     pravastatin (PRAVACHOL) 20 MG tablet Take 20 mg by mouth at bedtime.     propranolol (INDERAL) 10 MG tablet Take 10 mg by mouth 4 (four) times daily as needed (Takes for Tremors).     Syringe, Disposable, (2-3CC SYRINGE) 3 ML MISC 1 mg by Does not apply route every 14 (fourteen) days. 25 each 3   tadalafil (CIALIS) 20 MG tablet Take 1 tablet (20 mg total) by mouth daily as needed for erectile dysfunction. 30 tablet 3   testosterone cypionate (DEPOTESTOSTERONE CYPIONATE) 200 MG/ML injection Inject 1 mL (200 mg total) into the muscle every 14 (fourteen) days. 10 mL 0   warfarin (COUMADIN) 5 MG tablet Take 2.5-5 mg by mouth daily. 5 mg M, W, F and 2.5 mg on T, Th, Sat and Sun     No current facility-administered medications  for this visit.    Past Medical History:  Diagnosis Date   Acute cystitis    Allergic rhinitis    BPH (benign prostatic hyperplasia)    Bronchitis    chronic   Carpal tunnel syndrome    CHF (congestive heart failure) (HCC)    Complication of anesthesia    delayed emergence after fatty tumor surgery   DDD (degenerative disc disease), lumbar    whole body, joints   Deficiency, protein S (HCC)    Degenerative lumbar spinal stenosis    chronic back pain, left leg and foot   Diabetes mellitus (HCC)    diet controlled   DISH (diffuse idiopathic skeletal hyperostosis)    neck pain   Dyspnea    with exertion, wheezing occasionally   Erectile dysfunction    GERD (gastroesophageal reflux disease)    Gout    Headache    migraines-occasionally   History of DVT (deep vein  thrombosis)    left leg-after left foot surgery   History of migraine headaches    History of pulmonary embolism    bilateral   HOH (hard of hearing)    wears aids   Hypercholesterolemia    Hypercoagulable state (HCC)    Hypertension    controlled on meds   Hypogonadism in male    Infection of skin    Iron deficiency anemia    Lower urinary tract symptoms    Meralgia paresthetica    Neck pain    Obesity    OSA on CPAP    Post-void dribbling    Prostatitis    Pulmonary embolism (HCC)    after carpal tunnel surgery   SNHL (sensorineural hearing loss)    Vertigo    occasional    Past Surgical History:  Procedure Laterality Date   Blood vessel Surgery     CARPAL TUNNEL RELEASE Bilateral 2011   CATARACT EXTRACTION W/PHACO Right 10/17/2017   Procedure: CATARACT EXTRACTION PHACO AND INTRAOCULAR LENS PLACEMENT (IOC)  RIGHT DIABETIC;  Surgeon: Lockie Mola, MD;  Location: [facility];  Service: Ophthalmology;  Laterality: Right;  CPAP Diet controlled diabetic   CATARACT EXTRACTION W/PHACO Left 11/23/2017   Procedure: CATARACT EXTRACTION PHACO AND INTRAOCULAR LENS PLACEMENT (IOC)  LEFT DIABETIC;  Surgeon: Lockie Mola, MD;  Location: [facility];  Service: Ophthalmology;  Laterality: Left;  Diabetic  sleep apnea   COLONOSCOPY WITH PROPOFOL N/A 07/11/2015   Procedure: COLONOSCOPY WITH PROPOFOL;  Surgeon: Christena Deem, MD;  Location: [facility] END[facility];  Service: Endoscopy;  Laterality: N/A;   COLONOSCOPY WITH PROPOFOL N/A 02/02/2019   Procedure: COLONOSCOPY WITH PROPOFOL;  Surgeon: Christena Deem, MD;  Location: [facility] END[facility];  Service: Endoscopy;  Laterality: N/A;   FOOT SURGERY Bilateral    FOOT SURGERY Left 2003   complicated by DVT   HEMORRHOID SURGERY  1984   HERNIA REPAIR Bilateral 1985   x2   HOLEP-LASER ENUCLEATION OF THE PROSTATE WITH MORCELLATION  08/28/2014   Dr. Vanna Scotland   HYDROCELE EXCISION / REPAIR  1985   x2    Hydrocelectomy     x 2   INNER EAR SURGERY     INSERTION OF MESH Right 10/01/2022   Procedure: INSERTION OF MESH;  Surgeon: Sung Amabile, DO;  Location: [facility] ORS;  Service: General;  Laterality: Right;   JOINT REPLACEMENT     left knee, right shoulder   LEG SURGERY Right 1974   fatty tumor   PROSTATE SURGERY     REPLACEMENT  TOTAL KNEE Left 2012   SHOULDER SURGERY Right 1968   TONSILLECTOMY  1958   TOTAL SHOULDER ARTHROPLASTY Right 09/18/2014   arthroplasty, glenohumeral joint; total shoulder (glenoid and prosimal humeral replacement)   TUMOR EXCISION Right    fatty tumor   VASECTOMY  1979     Social History   Tobacco Use   Smoking status: Never   Smokeless tobacco: Never  Vaping Use   Vaping status: Never Used  Substance Use Topics   Alcohol use: No    Alcohol/week: 0.0 standard drinks of alcohol   Drug use: Never       Family History  Problem Relation Age of Onset   Thrombosis Other    Skin cancer Father    Kidney disease Neg Hx    Prostate cancer Neg Hx    Kidney cancer Neg Hx    Bladder Cancer Neg Hx   Brother with DVT and Factor V Leiden  Allergies  Allergen Reactions   Penicillins     Other reaction(s):unknown,as a child     REVIEW OF SYSTEMS (Negative unless checked)  Constitutional: [] Weight loss  [] Fever  [] Chills Cardiac: [] Chest pain   [] Chest pressure   [] Palpitations   [] Shortness of breath when laying flat   [] Shortness of breath at rest   [] Shortness of breath with exertion. Vascular:  [] Pain in legs with walking   [] Pain in legs at rest   [] Pain in legs when laying flat   [] Claudication   [] Pain in feet when walking  [] Pain in feet at rest  [] Pain in feet when laying flat   [x] History of DVT   [x] Phlebitis   [x] Swelling in legs   [] Varicose veins   [] Non-healing ulcers Pulmonary:   [] Uses home oxygen   [] Productive cough   [] Hemoptysis   [] Wheeze  [] COPD   [] Asthma Neurologic:  [] Dizziness  [] Blackouts   [] Seizures   [] History of stroke    [] History of TIA  [] Aphasia   [] Temporary blindness   [] Dysphagia   [] Weakness or numbness in arms   [] Weakness or numbness in legs Musculoskeletal:  [x] Arthritis   [] Joint swelling   [x] Joint pain   [] Low back pain Hematologic:  [] Easy bruising  [] Easy bleeding   [x] Hypercoagulable state   [] Anemic   Gastrointestinal:  [] Blood in stool   [] Vomiting blood  [] Gastroesophageal reflux/heartburn   [] Abdominal pain Genitourinary:  [] Chronic kidney disease   [] Difficult urination  [] Frequent urination  [] Burning with urination   [] Hematuria Skin:  [] Rashes   [] Ulcers   [] Wounds Psychological:  [] History of anxiety   []  History of major depression.  Physical Examination  BP (!) 140/63   Pulse 67   Resp 18   Ht 6' (1.829 m)   Wt (!) 308 lb (139.7 kg)   BMI 41.77 kg/m  Gen:  WD/WN, NAD. Obese  Head: /AT, No temporalis wasting. Ear/Nose/Throat: Hearing grossly intact, nares w/o erythema or drainage Eyes: Conjunctiva clear. Sclera non-icteric Neck: Supple.  Trachea midline Pulmonary:  Good air movement, no use of accessory muscles.  Cardiac: RRR, no JVD Vascular:  Vessel Right Left  Radial Palpable Palpable                       Musculoskeletal: M/S 5/5 throughout.  No deformity or atrophy. 1+ RLE edema, 2+ LLE edema.  Significant stasis dermatitis and hyperpigmentation worse on the left than the right. Neurologic: Sensation grossly intact in extremities.  Symmetrical.  Speech is fluent.  Psychiatric: Judgment intact,  Mood & affect appropriate for pt's clinical situation. Dermatologic: No rashes or ulcers noted.  No cellulitis or open wounds.      Labs Recent Results (from the past 2160 hours)  Testosterone     Status: None   Collection Time: 05/30/23  8:21 AM  Result Value Ref Range   Testosterone 281 264 - 916 ng/dL    Comment: Adult male reference interval is based on a population of healthy nonobese males (BMI <30) between 75 and 87 years old. Travison, et.al. JCEM  [phone]. PMID: [id number].   FSH/LH     Status: Abnormal   Collection Time: 05/30/23  8:21 AM  Result Value Ref Range   LH <0.3 (L) 1.7 - 8.6 mIU/mL   FSH <0.3 (L) 1.5 - 12.4 mIU/mL    Radiology No results found.  Assessment/Plan  Pulmonary embolism and infarction [facility]) Previous history of pulmonary embolus even after carpal tunnel surgery.  He would be at extremely high risk of thromboembolic complications with a major orthopedic surgery.  As such, a an IVC filter would be indicated to avoid pulmonary embolus in the perioperative setting.  This could then be removed approximately 2 months after his orthopedic surgery if no new acute DVT is seen on a postoperative duplex.  I have discussed the risks and benefits of the procedure with the patient.  I discussed the reason and rationale for placement of the IVC filter.  He voices his understanding and is agreeable to proceed.  Essential hypertension blood pressure control important in reducing the progression of atherosclerotic disease. On appropriate oral medications.   Type 2 diabetes mellitus with other specified complication (HCC) blood glucose control important in reducing the progression of atherosclerotic disease. Also, involved in wound healing. On appropriate medications.   Lymphedema The patient has severe, stage II-III lymphedema with swelling refractory to continued use of compression socks, elevation, and activity.  His significant swelling is causing impairments on activities of daily living.  He has a lymphedema pump but it is nonfunctional.  We need to get a new lymphedema pump for him as he would clearly benefit from this.    [name], MD  07/05/2023 1:54 PM    This note was created with Dragon medical transcription system.  Any errors from dictation are purely unintentional

## 2023-07-05 NOTE — Assessment & Plan Note (Signed)
 blood pressure control important in reducing the progression of atherosclerotic disease. On appropriate oral medications.

## 2023-07-07 ENCOUNTER — Encounter
Admission: RE | Admit: 2023-07-07 | Discharge: 2023-07-07 | Disposition: A | Discharge status: Home / Self Care | Payer: Medicare Other | Source: Ambulatory Visit | Attending: Orthopedic Surgery | Admitting: Orthopedic Surgery

## 2023-07-07 ENCOUNTER — Other Ambulatory Visit: Payer: Self-pay

## 2023-07-07 VITALS — BP 150/67 | HR 67 | Resp 16 | Ht 72.0 in | Wt 309.0 lb

## 2023-07-07 DIAGNOSIS — Z9229 Personal history of other drug therapy: Secondary | ICD-10-CM

## 2023-07-07 DIAGNOSIS — E1169 Type 2 diabetes mellitus with other specified complication: Secondary | ICD-10-CM

## 2023-07-07 DIAGNOSIS — Z8679 Personal history of other diseases of the circulatory system: Secondary | ICD-10-CM

## 2023-07-07 DIAGNOSIS — Z01818 Encounter for other preprocedural examination: Secondary | ICD-10-CM | POA: Diagnosis present

## 2023-07-07 HISTORY — DX: Activated protein C resistance: D68.51

## 2023-07-07 LAB — URINALYSIS, ROUTINE W REFLEX MICROSCOPIC
Bacteria, UA: NONE SEEN
Bilirubin Urine: NEGATIVE
Glucose, UA: NEGATIVE mg/dL
Ketones, ur: NEGATIVE mg/dL
Leukocytes,Ua: NEGATIVE
Nitrite: NEGATIVE
Protein, ur: NEGATIVE mg/dL
Specific Gravity, Urine: 1.029 (ref 1.005–1.030)
pH: 5 (ref 5.0–8.0)

## 2023-07-07 LAB — COMPREHENSIVE METABOLIC PANEL
ALT: 20 U/L (ref 0–44)
AST: 21 U/L (ref 15–41)
Albumin: 3.1 g/dL — ABNORMAL LOW (ref 3.5–5.0)
Alkaline Phosphatase: 61 U/L (ref 38–126)
Anion gap: 6 (ref 5–15)
BUN: 16 mg/dL (ref 8–23)
CO2: 27 mmol/L (ref 22–32)
Calcium: 8.6 mg/dL — ABNORMAL LOW (ref 8.9–10.3)
Chloride: 101 mmol/L (ref 98–111)
Creatinine, Ser: 0.9 mg/dL (ref 0.61–1.24)
GFR, Estimated: >60 mL/min (ref >60)
Glucose, Bld: 102 mg/dL — ABNORMAL HIGH (ref 70–99)
Potassium: 4.2 mmol/L (ref 3.5–5.1)
Sodium: 134 mmol/L — ABNORMAL LOW (ref 135–145)
Total Bilirubin: 0.4 mg/dL (ref 0.0–1.2)
Total Protein: 6.6 g/dL (ref 6.5–8.1)

## 2023-07-07 LAB — CBC WITH DIFFERENTIAL/PLATELET
Abs Immature Granulocytes: 0.01 10*3/uL (ref 0.00–0.07)
Basophils Absolute: 0 10*3/uL (ref 0.0–0.1)
Basophils Relative: 1 %
Eosinophils Absolute: 0.1 10*3/uL (ref 0.0–0.5)
Eosinophils Relative: 1 %
HCT: 42.4 % (ref 39.0–52.0)
Hemoglobin: 13 g/dL (ref 13.0–17.0)
Immature Granulocytes: 0 %
Lymphocytes Relative: 30 %
Lymphs Abs: 2.1 10*3/uL (ref 0.7–4.0)
MCH: 24.5 pg — ABNORMAL LOW (ref 26.0–34.0)
MCHC: 30.7 g/dL (ref 30.0–36.0)
MCV: 80 fL (ref 80.0–100.0)
Monocytes Absolute: 0.7 10*3/uL (ref 0.1–1.0)
Monocytes Relative: 10 %
Neutro Abs: 4.1 10*3/uL (ref 1.7–7.7)
Neutrophils Relative %: 58 %
Platelets: 244 10*3/uL (ref 150–400)
RBC: 5.3 MIL/uL (ref 4.22–5.81)
RDW: 20.1 % — ABNORMAL HIGH (ref 11.5–15.5)
WBC: 7 10*3/uL (ref 4.0–10.5)
nRBC: 0 % (ref 0.0–0.2)

## 2023-07-07 LAB — SURGICAL PCR SCREEN
MRSA, PCR: NEGATIVE
Staphylococcus aureus: POSITIVE — AB

## 2023-07-07 LAB — TYPE AND SCREEN
ABO/RH(D): O POS
Antibody Screen: NEGATIVE

## 2023-07-07 NOTE — Patient Instructions (Addendum)
 Your procedure is scheduled on: Monday 07/18/23 To find out your arrival time, please call [phone] between 1PM - 3PM on:   Friday 07/15/23 Report to the Registration Desk on the 1st floor of the [facility]. FREE Valet parking is available.  If your arrival time is 6:00 am, do not arrive before that time as the [facility] entrance doors do not open until 6:00 am.  REMEMBER: Instructions that are not followed completely may result in serious medical risk, up to and including death; or upon the discretion of your surgeon and anesthesiologist your surgery may need to be rescheduled.  Do not eat food after midnight the night before surgery.  No gum chewing or hard candies.  You may however, drink CLEAR liquids up to 2 hours before you are scheduled to arrive for your surgery. Do not drink anything within 2 hours of your scheduled arrival time.  Clear liquids include: - water   Type 1 and Type 2 diabetics should only drink water.  In addition, your doctor has ordered for you to drink the provided:   Gatorade G2 Drinking this carbohydrate drink up to two hours before surgery helps to reduce insulin resistance and improve patient outcomes. Please complete drinking 2 hours before scheduled arrival time.  One week prior to surgery: Stop Anti-inflammatories (NSAIDS) such as Advil, Aleve, Ibuprofen, Motrin, Naproxen, Naprosyn and Aspirin based products such as Excedrin, Goody's Powder, BC Powder. You may however, continue to take Tylenol if needed for pain up until the day of surgery.  Stop ANY OVER THE COUNTER supplements and vitamins for 7 days until after surgery.  Continue taking all prescribed medications with the exception of the following: Coumadin, last dose as instructed Wednesday  07/13/23. Metformin, last dose Friday 07/15/23  TAKE ONLY THESE MEDICATIONS THE MORNING OF SURGERY WITH A SIP OF WATER:  DULoxetine (CYMBALTA)  gabapentin (NEURONTIN)  pantoprazole (PROTONIX)   HYDROcodone-acetaminophen (NORCO/VICODIN)  Propranol if needed Cetirizine/zyrtec if needed  No Alcohol for 24 hours before or after surgery.  No Smoking including e-cigarettes for 24 hours before surgery.  No chewable tobacco products for at least 6 hours before surgery.  No nicotine patches on the day of surgery.  Do not use any "recreational" drugs for at least a week (preferably 2 weeks) before your surgery.  Please be advised that the combination of cocaine and anesthesia may have negative outcomes, up to and including death. If you test positive for cocaine, your surgery will be cancelled.  On the morning of surgery brush your teeth with toothpaste and water, you may rinse your mouth with mouthwash if you wish. Do not swallow any toothpaste or mouthwash.  Use CHG Soap or wipes as directed on instruction sheet.  Do not wear lotions, powders, or perfumes on the day of surgery  Do not shave body hair from the neck down 48 hours before surgery.  Wear comfortable clothing (specific to your surgery type) to the hospital.  Do not wear jewelry, make-up, hairpins, clips or nail polish.  For welded (permanent) jewelry: bracelets, anklets, waist bands, etc.  Please have this removed prior to surgery.  If it is not removed, there is a chance that hospital personnel will need to cut it off on the day of surgery. Contact lenses, hearing aids and dentures may not be worn into surgery.  Bring your C-PAP to the hospital in case you may have to spend the night.   Do not bring valuables to the hospital. [facility] is  not responsible for any missing/lost belongings or valuables.   Notify your doctor if there is any change in your medical condition (cold, fever, infection).  If you are being discharged the day of surgery, you will not be allowed to drive home. You will need a responsible individual to drive you home and stay with you for 24 hours after surgery.   If you are taking public  transportation, you will need to have a responsible individual with you.  If you are being admitted to the hospital overnight, leave your suitcase in the car. After surgery it may be brought to your room.  In case of increased patient census, it may be necessary for you, the patient, to continue your postoperative care in the Same Day Surgery department.  After surgery, you can help prevent lung complications by doing breathing exercises.  Take deep breaths and cough every 1-2 hours. Your doctor may order a device called an Incentive Spirometer to help you take deep breaths. When coughing or sneezing, hold a pillow firmly against your incision with both hands. This is called "splinting." Doing this helps protect your incision. It also decreases belly discomfort.  Surgery Visitation Policy:  Patients undergoing a surgery or procedure may have two family members or support persons with them as long as the person is not COVID-19 positive or experiencing its symptoms.   Inpatient Visitation:    Visiting hours are 7 a.m. to 8 p.m. Up to four visitors are allowed at one time in a patient room. The visitors may rotate out with other people during the day. One designated support person (adult) may remain overnight.  Due to an increase in RSV and influenza rates and associated hospitalizations, children ages 67 and under will not be able to visit patients in [facility]. Masks continue to be strongly recommended.  Please call the Pre-admissions Testing Dept. at [phone] if you have any questions about these instructions.    Pre-operative 5 CHG Bath Instructions   You can play a key role in reducing the risk of infection after surgery. Your skin needs to be as free of germs as possible. You can reduce the number of germs on your skin by washing with CHG (chlorhexidine gluconate) soap before surgery. CHG is an antiseptic soap that kills germs and continues to kill germs even after  washing.   DO NOT use if you have an allergy to chlorhexidine/CHG or antibacterial soaps. If your skin becomes reddened or irritated, stop using the CHG and notify one of our RNs at [phone].   Please shower with the CHG soap starting 4 days before surgery using the following schedule:   Thursday 07/14/23 - Monday 07/18/23    Please keep in mind the following:  DO NOT shave, including legs and underarms, starting the day of your first shower.   You may shave your face at any point before/day of surgery.  Place clean sheets on your bed the day you start using CHG soap. Use a clean washcloth (not used since being washed) for each shower. DO NOT sleep with pets once you start using the CHG.   CHG Shower Instructions:  If you choose to wash your hair and private area, wash first with your normal shampoo/soap.  After you use shampoo/soap, rinse your hair and body thoroughly to remove shampoo/soap residue.  Turn the water OFF and apply about 3 tablespoons (45 ml) of CHG soap to a CLEAN washcloth.  Apply CHG soap ONLY FROM YOUR NECK  DOWN TO YOUR TOES (washing for 3-5 minutes)  DO NOT use CHG soap on face, private areas, open wounds, or sores.  Pay special attention to the area where your surgery is being performed.  If you are having back surgery, having someone wash your back for you may be helpful. Wait 2 minutes after CHG soap is applied, then you may rinse off the CHG soap.  Pat dry with a clean towel  Put on clean clothes/pajamas   If you choose to wear lotion, please use ONLY the CHG-compatible lotions on the back of this paper.     Additional instructions for the day of surgery: DO NOT APPLY any lotions, deodorants, cologne, or perfumes.   Put on clean/comfortable clothes.  Brush your teeth.  Ask your nurse before applying any prescription medications to the skin.      CHG Compatible Lotions   Aveeno Moisturizing lotion  Cetaphil Moisturizing Cream  Cetaphil Moisturizing  Lotion  Clairol Herbal Essence Moisturizing Lotion, Dry Skin  Clairol Herbal Essence Moisturizing Lotion, Extra Dry Skin  Clairol Herbal Essence Moisturizing Lotion, Normal Skin  Curel Age Defying Therapeutic Moisturizing Lotion with Alpha Hydroxy  Curel Extreme Care Body Lotion  Curel Soothing Hands Moisturizing Hand Lotion  Curel Therapeutic Moisturizing Cream, Fragrance-Free  Curel Therapeutic Moisturizing Lotion, Fragrance-Free  Curel Therapeutic Moisturizing Lotion, Original Formula  Eucerin Daily Replenishing Lotion  Eucerin Dry Skin Therapy Plus Alpha Hydroxy Crme  Eucerin Dry Skin Therapy Plus Alpha Hydroxy Lotion  Eucerin Original Crme  Eucerin Original Lotion  Eucerin Plus Crme Eucerin Plus Lotion  Eucerin TriLipid Replenishing Lotion  Keri Anti-Bacterial Hand Lotion  Keri Deep Conditioning Original Lotion Dry Skin Formula Softly Scented  Keri Deep Conditioning Original Lotion, Fragrance Free Sensitive Skin Formula  Keri Lotion Fast Absorbing Fragrance Free Sensitive Skin Formula  Keri Lotion Fast Absorbing Softly Scented Dry Skin Formula  Keri Original Lotion  Keri Skin Renewal Lotion Keri Silky Smooth Lotion  Keri Silky Smooth Sensitive Skin Lotion  Nivea Body Creamy Conditioning Oil  Nivea Body Extra Enriched Teacher, adult education Moisturizing Lotion Nivea Crme  Nivea Skin Firming Lotion  NutraDerm 30 Skin Lotion  NutraDerm Skin Lotion  NutraDerm Therapeutic Skin Cream  NutraDerm Therapeutic Skin Lotion  ProShield Protective Hand Cream  Provon moisturizing lotion

## 2023-07-08 ENCOUNTER — Telehealth: Payer: Self-pay

## 2023-07-08 NOTE — Telephone Encounter (Signed)
 No PA needed for Aveed via medicare Reference # [id number]  Should go ahead and schedule pt for aveed injection?

## 2023-07-11 NOTE — Telephone Encounter (Signed)
 Pt states he would like to go back to [facility]. He still has testosterone injections left, states he is not sure how much he has left. Do we need to wait some time after he is done with his testosterone injections before we get him schedule for AVVED?

## 2023-07-11 NOTE — Telephone Encounter (Signed)
Sent message through myChart.

## 2023-07-14 ENCOUNTER — Encounter: Payer: Self-pay | Admitting: Vascular Surgery

## 2023-07-14 ENCOUNTER — Encounter
Admission: RE | Disposition: A | Discharge status: Home / Self Care | Payer: Self-pay | Source: Home / Self Care | Attending: Vascular Surgery

## 2023-07-14 ENCOUNTER — Other Ambulatory Visit: Payer: Self-pay

## 2023-07-14 ENCOUNTER — Ambulatory Visit
Admission: RE | Admit: 2023-07-14 | Discharge: 2023-07-14 | Disposition: A | Discharge status: Home / Self Care | Payer: Medicare Other | Attending: Vascular Surgery | Admitting: Vascular Surgery

## 2023-07-14 DIAGNOSIS — I890 Lymphedema, not elsewhere classified: Secondary | ICD-10-CM | POA: Diagnosis not present

## 2023-07-14 DIAGNOSIS — Z79899 Other long term (current) drug therapy: Secondary | ICD-10-CM | POA: Insufficient documentation

## 2023-07-14 DIAGNOSIS — Z408 Encounter for other prophylactic surgery: Secondary | ICD-10-CM | POA: Diagnosis not present

## 2023-07-14 DIAGNOSIS — I110 Hypertensive heart disease with heart failure: Secondary | ICD-10-CM | POA: Diagnosis not present

## 2023-07-14 DIAGNOSIS — D6851 Activated protein C resistance: Secondary | ICD-10-CM | POA: Diagnosis not present

## 2023-07-14 DIAGNOSIS — Z86711 Personal history of pulmonary embolism: Secondary | ICD-10-CM

## 2023-07-14 DIAGNOSIS — E119 Type 2 diabetes mellitus without complications: Secondary | ICD-10-CM | POA: Insufficient documentation

## 2023-07-14 DIAGNOSIS — I509 Heart failure, unspecified: Secondary | ICD-10-CM | POA: Insufficient documentation

## 2023-07-14 DIAGNOSIS — I82409 Acute embolism and thrombosis of unspecified deep veins of unspecified lower extremity: Secondary | ICD-10-CM

## 2023-07-14 DIAGNOSIS — Z7984 Long term (current) use of oral hypoglycemic drugs: Secondary | ICD-10-CM | POA: Insufficient documentation

## 2023-07-14 DIAGNOSIS — Z86718 Personal history of other venous thrombosis and embolism: Secondary | ICD-10-CM

## 2023-07-14 HISTORY — PX: IVC FILTER INSERTION: CATH118245

## 2023-07-14 LAB — GLUCOSE, CAPILLARY: Glucose-Capillary: 104 mg/dL — ABNORMAL HIGH (ref 70–99)

## 2023-07-14 SURGERY — IVC FILTER INSERTION
Anesthesia: Moderate Sedation

## 2023-07-14 MED ORDER — ONDANSETRON HCL 4 MG/2ML IJ SOLN: 4.0000 mg | Freq: Four times a day (QID) | INTRAMUSCULAR | Status: DC | PRN

## 2023-07-14 MED ORDER — IODIXANOL 320 MG/ML IV SOLN
INTRAVENOUS | Status: DC | PRN
  Administered 2023-07-14: 10 mL via INTRAVENOUS

## 2023-07-14 MED ORDER — HEPARIN (PORCINE) IN NACL 1000-0.9 UT/500ML-% IV SOLN
INTRAVENOUS | Status: DC | PRN
  Administered 2023-07-14: 500 mL

## 2023-07-14 MED ORDER — FAMOTIDINE 20 MG PO TABS: 40.0000 mg | ORAL_TABLET | Freq: Once | ORAL | Status: DC | PRN

## 2023-07-14 MED ORDER — CEFAZOLIN SODIUM-DEXTROSE 2-4 GM/100ML-% IV SOLN
INTRAVENOUS | Status: AC
  Filled 2023-07-14: qty 100

## 2023-07-14 MED ORDER — MIDAZOLAM HCL 5 MG/5ML IJ SOLN
INTRAMUSCULAR | Status: AC
  Filled 2023-07-14: qty 5

## 2023-07-14 MED ORDER — LIDOCAINE HCL (PF) 1 % IJ SOLN
INTRAMUSCULAR | Status: DC | PRN
  Administered 2023-07-14: 10 mL

## 2023-07-14 MED ORDER — FENTANYL CITRATE (PF) 100 MCG/2ML IJ SOLN
INTRAMUSCULAR | Status: DC | PRN
  Administered 2023-07-14: 50 ug via INTRAVENOUS

## 2023-07-14 MED ORDER — DIPHENHYDRAMINE HCL 50 MG/ML IJ SOLN: 50.0000 mg | Freq: Once | INTRAMUSCULAR | Status: DC | PRN

## 2023-07-14 MED ORDER — METHYLPREDNISOLONE SODIUM SUCC 125 MG IJ SOLR: 125.0000 mg | Freq: Once | INTRAMUSCULAR | Status: DC | PRN

## 2023-07-14 MED ORDER — SODIUM CHLORIDE 0.9 % IV SOLN: INTRAVENOUS | Status: DC

## 2023-07-14 MED ORDER — FENTANYL CITRATE (PF) 100 MCG/2ML IJ SOLN
INTRAMUSCULAR | Status: AC
  Filled 2023-07-14: qty 2

## 2023-07-14 MED ORDER — CEFAZOLIN SODIUM-DEXTROSE 2-4 GM/100ML-% IV SOLN
2.0000 g | INTRAVENOUS | Status: AC
  Administered 2023-07-14: 2 g via INTRAVENOUS

## 2023-07-14 MED ORDER — MIDAZOLAM HCL 2 MG/2ML IJ SOLN
INTRAMUSCULAR | Status: DC | PRN
  Administered 2023-07-14: 2 mg via INTRAVENOUS

## 2023-07-14 MED ORDER — HYDROMORPHONE HCL 1 MG/ML IJ SOLN: 1.0000 mg | Freq: Once | INTRAMUSCULAR | Status: DC | PRN

## 2023-07-14 MED ORDER — MIDAZOLAM HCL 2 MG/ML PO SYRP: 8.0000 mg | ORAL_SOLUTION | Freq: Once | ORAL | Status: DC | PRN

## 2023-07-14 SURGICAL SUPPLY — 4 items
COVER PROBE ULTRASOUND 5X96 (MISCELLANEOUS) IMPLANT
KIT FEM OPTION ELITE FILTER (Filter) IMPLANT
PACK ANGIOGRAPHY (CUSTOM PROCEDURE TRAY) ×1 IMPLANT
WIRE SUPRACORE 190CM (WIRE) IMPLANT

## 2023-07-14 NOTE — Interval H&P Note (Signed)
 History and Physical Interval Note:  07/14/2023 12:45 PM  Lance [name]  has presented today for surgery, with the diagnosis of IVC filter placement   DVT.  The various methods of treatment have been discussed with the patient and family. After consideration of risks, benefits and other options for treatment, the patient has consented to  Procedure(s): IVC FILTER INSERTION (N/A) as a surgical intervention.  The patient's history has been reviewed, patient examined, no change in status, stable for surgery.  I have reviewed the patient's chart and labs.  Questions were answered to the patient's satisfaction.     [name]

## 2023-07-14 NOTE — Op Note (Signed)
 [facility]   OPERATIVE NOTE    PRE-OPERATIVE DIAGNOSIS: Multiple previous DVT and PEs, upcoming major orthopedic surgery  POST-OPERATIVE DIAGNOSIS: same as above  PROCEDURE: 1.   Ultrasound guidance for vascular access to the right femoral vein 2.   Catheter placement into the inferior vena cava 3.   Inferior venacavogram 4.   Placement of a argon option Elite IVC filter  SURGEON: [name], MD  ASSISTANT(S): None  ANESTHESIA: local with Moderate Conscious Sedation for approximately 11 minutes using to mg of Versed and 50 mcg of Fentanyl  ESTIMATED BLOOD LOSS: minimal  CONTRAST: 20 cc  FLUORO TIME: less than one minute  FINDING(S): 1.  Patent IVC  SPECIMEN(S):  none  INDICATIONS:   Lance [name] is a 73 y.o. male who presents with an upcoming major orthopedic surgery with a previous history of multiple thromboembolic problems.  Inferior vena cava filter is indicated for this reason.  Risks and benefits including filter thrombosis, migration, fracture, bleeding, and infection were all discussed.  We discussed that all IVC filters that we place can be removed if desired from the patient once the need for the filter has passed.    DESCRIPTION: After obtaining full informed written consent, the patient was brought back to the vascular suite. The skin was sterilely prepped and draped in a sterile surgical field was created. Moderate conscious sedation was administered during a face to face encounter with the patient throughout the procedure with my supervision of the RN administering medicines and monitoring the patient's vital signs, pulse oximetry, telemetry and mental status throughout from the start of the procedure until the patient was taken to the recovery room. The right femoral vein was accessed under direct ultrasound guidance without difficulty with a Seldinger needle and a J-wire was then placed. After skin nick and dilatation, the delivery  sheath was placed into the inferior vena cava and an inferior venacavogram was performed. This demonstrated a patent IVC with the level of the renal veins at the L1-L2 interspace.  The filter was then deployed into the inferior vena cava at the level of the bottom of L2 just below the renal veins. The delivery sheath was then removed. Pressure was held. Sterile dressings were placed. The patient tolerated the procedure well and was taken to the recovery room in stable condition.  COMPLICATIONS: None  CONDITION: Stable  [name]  07/14/2023, 2:19 PM   This note was created with Dragon Medical transcription system. Any errors in dictation are purely unintentional.

## 2023-07-15 ENCOUNTER — Encounter: Payer: Self-pay | Admitting: Vascular Surgery

## 2023-07-18 ENCOUNTER — Encounter: Payer: Self-pay | Admitting: Orthopedic Surgery

## 2023-07-18 ENCOUNTER — Ambulatory Visit: Admitting: Anesthesiology

## 2023-07-18 ENCOUNTER — Other Ambulatory Visit: Payer: Self-pay

## 2023-07-18 ENCOUNTER — Encounter
Admission: RE | Disposition: A | Discharge status: Home Health | Payer: Self-pay | Source: Home / Self Care | Attending: Orthopedic Surgery

## 2023-07-18 ENCOUNTER — Ambulatory Visit
Admission: RE | Admit: 2023-07-18 | Discharge: 2023-07-19 | Disposition: A | Discharge status: Home Health | Payer: Medicare Other | Attending: Orthopedic Surgery | Admitting: Orthopedic Surgery

## 2023-07-18 ENCOUNTER — Ambulatory Visit

## 2023-07-18 DIAGNOSIS — Z79899 Other long term (current) drug therapy: Secondary | ICD-10-CM | POA: Diagnosis not present

## 2023-07-18 DIAGNOSIS — D6861 Antiphospholipid syndrome: Secondary | ICD-10-CM

## 2023-07-18 DIAGNOSIS — I509 Heart failure, unspecified: Secondary | ICD-10-CM | POA: Insufficient documentation

## 2023-07-18 DIAGNOSIS — M1612 Unilateral primary osteoarthritis, left hip: Secondary | ICD-10-CM | POA: Insufficient documentation

## 2023-07-18 DIAGNOSIS — Z7984 Long term (current) use of oral hypoglycemic drugs: Secondary | ICD-10-CM | POA: Diagnosis not present

## 2023-07-18 DIAGNOSIS — Z86718 Personal history of other venous thrombosis and embolism: Secondary | ICD-10-CM | POA: Diagnosis not present

## 2023-07-18 DIAGNOSIS — G4733 Obstructive sleep apnea (adult) (pediatric): Secondary | ICD-10-CM | POA: Insufficient documentation

## 2023-07-18 DIAGNOSIS — Z9229 Personal history of other drug therapy: Secondary | ICD-10-CM

## 2023-07-18 DIAGNOSIS — K219 Gastro-esophageal reflux disease without esophagitis: Secondary | ICD-10-CM | POA: Diagnosis not present

## 2023-07-18 DIAGNOSIS — Z8679 Personal history of other diseases of the circulatory system: Secondary | ICD-10-CM

## 2023-07-18 DIAGNOSIS — I110 Hypertensive heart disease with heart failure: Secondary | ICD-10-CM | POA: Diagnosis not present

## 2023-07-18 DIAGNOSIS — E119 Type 2 diabetes mellitus without complications: Secondary | ICD-10-CM | POA: Insufficient documentation

## 2023-07-18 DIAGNOSIS — Z7901 Long term (current) use of anticoagulants: Secondary | ICD-10-CM | POA: Diagnosis not present

## 2023-07-18 DIAGNOSIS — Z86711 Personal history of pulmonary embolism: Secondary | ICD-10-CM | POA: Insufficient documentation

## 2023-07-18 DIAGNOSIS — Z96642 Presence of left artificial hip joint: Secondary | ICD-10-CM | POA: Diagnosis present

## 2023-07-18 DIAGNOSIS — E1169 Type 2 diabetes mellitus with other specified complication: Secondary | ICD-10-CM

## 2023-07-18 HISTORY — PX: TOTAL HIP ARTHROPLASTY: SHX124

## 2023-07-18 LAB — ABO/RH: ABO/RH(D): O POS

## 2023-07-18 LAB — GLUCOSE, CAPILLARY
Glucose-Capillary: 130 mg/dL — ABNORMAL HIGH (ref 70–99)
Glucose-Capillary: 154 mg/dL — ABNORMAL HIGH (ref 70–99)

## 2023-07-18 LAB — PROTIME-INR
INR: 1.1 (ref 0.8–1.2)
Prothrombin Time: 14.3 s (ref 11.4–15.2)

## 2023-07-18 SURGERY — ARTHROPLASTY, HIP, TOTAL, ANTERIOR APPROACH
Anesthesia: Spinal | Site: Hip | Laterality: Left

## 2023-07-18 MED ORDER — PRAVASTATIN SODIUM 20 MG PO TABS
20.0000 mg | ORAL_TABLET | Freq: Every day | ORAL | Status: DC
  Administered 2023-07-18 – 2023-07-19 (×2): 20 mg via ORAL
  Filled 2023-07-18 (×2): qty 1

## 2023-07-18 MED ORDER — MENTHOL 3 MG MT LOZG: 1.0000 | LOZENGE | OROMUCOSAL | Status: DC | PRN

## 2023-07-18 MED ORDER — FENTANYL CITRATE (PF) 100 MCG/2ML IJ SOLN
INTRAMUSCULAR | Status: AC
  Filled 2023-07-18: qty 2

## 2023-07-18 MED ORDER — FENTANYL CITRATE (PF) 100 MCG/2ML IJ SOLN
INTRAMUSCULAR | Status: DC | PRN
  Administered 2023-07-18: 25 ug via INTRAVENOUS
  Administered 2023-07-18: 50 ug via INTRAVENOUS
  Administered 2023-07-18: 25 ug via INTRAVENOUS

## 2023-07-18 MED ORDER — SODIUM CHLORIDE 0.9 % IV SOLN
INTRAVENOUS | Status: DC
Start: 2023-07-18 — End: 2023-07-18

## 2023-07-18 MED ORDER — WARFARIN - PHARMACIST DOSING INPATIENT
Freq: Every day | Status: DC
Start: 2023-07-18 — End: 2023-07-19

## 2023-07-18 MED ORDER — DEXAMETHASONE SODIUM PHOSPHATE 10 MG/ML IJ SOLN
8.0000 mg | Freq: Once | INTRAMUSCULAR | Status: AC
  Administered 2023-07-18: 8 mg via INTRAVENOUS

## 2023-07-18 MED ORDER — KETAMINE HCL 50 MG/5ML IJ SOSY
PREFILLED_SYRINGE | INTRAMUSCULAR | Status: DC | PRN
  Administered 2023-07-18: 15 mg via INTRAVENOUS
  Administered 2023-07-18: 10 mg via INTRAVENOUS
  Administered 2023-07-18: 15 mg via INTRAVENOUS

## 2023-07-18 MED ORDER — DEXAMETHASONE SODIUM PHOSPHATE 10 MG/ML IJ SOLN
INTRAMUSCULAR | Status: AC
  Filled 2023-07-18: qty 1

## 2023-07-18 MED ORDER — TRANEXAMIC ACID-NACL 1000-0.7 MG/100ML-% IV SOLN: 1000.0000 mg | INTRAVENOUS | Status: DC

## 2023-07-18 MED ORDER — KETOROLAC TROMETHAMINE 15 MG/ML IJ SOLN
INTRAMUSCULAR | Status: AC
  Filled 2023-07-18: qty 1

## 2023-07-18 MED ORDER — ACETAMINOPHEN 10 MG/ML IV SOLN
INTRAVENOUS | Status: AC
  Filled 2023-07-18: qty 100

## 2023-07-18 MED ORDER — WARFARIN SODIUM 7.5 MG PO TABS
7.5000 mg | ORAL_TABLET | Freq: Once | ORAL | Status: AC
  Administered 2023-07-18: 7.5 mg via ORAL
  Filled 2023-07-18: qty 1

## 2023-07-18 MED ORDER — SURGIPHOR WOUND IRRIGATION SYSTEM - OPTIME: TOPICAL | Status: DC | PRN

## 2023-07-18 MED ORDER — BUPIVACAINE HCL (PF) 0.5 % IJ SOLN
INTRAMUSCULAR | Status: DC | PRN
  Administered 2023-07-18: 3 mL

## 2023-07-18 MED ORDER — CEFAZOLIN SODIUM-DEXTROSE 3-4 GM/150ML-% IV SOLN
3.0000 g | Freq: Four times a day (QID) | INTRAVENOUS | Status: AC
  Administered 2023-07-18 (×2): 3 g via INTRAVENOUS
  Filled 2023-07-18 (×2): qty 150

## 2023-07-18 MED ORDER — GABAPENTIN 300 MG PO CAPS
ORAL_CAPSULE | ORAL | Status: AC
Start: 2023-07-18 — End: ?
  Filled 2023-07-18: qty 1

## 2023-07-18 MED ORDER — KETOROLAC TROMETHAMINE 15 MG/ML IJ SOLN
7.5000 mg | Freq: Four times a day (QID) | INTRAMUSCULAR | Status: AC
  Administered 2023-07-18 – 2023-07-19 (×4): 7.5 mg via INTRAVENOUS
  Filled 2023-07-18 (×3): qty 1

## 2023-07-18 MED ORDER — EPHEDRINE 5 MG/ML INJ
INTRAVENOUS | Status: AC
  Filled 2023-07-18: qty 5

## 2023-07-18 MED ORDER — ENOXAPARIN SODIUM 30 MG/0.3ML IJ SOSY
30.0000 mg | PREFILLED_SYRINGE | Freq: Two times a day (BID) | INTRAMUSCULAR | Status: DC
  Administered 2023-07-19: 30 mg via SUBCUTANEOUS
  Filled 2023-07-18: qty 0.3

## 2023-07-18 MED ORDER — PROPOFOL 1000 MG/100ML IV EMUL
INTRAVENOUS | Status: AC
  Filled 2023-07-18: qty 100

## 2023-07-18 MED ORDER — SODIUM CHLORIDE (PF) 0.9 % IJ SOLN
INTRAMUSCULAR | Status: AC
  Filled 2023-07-18: qty 40

## 2023-07-18 MED ORDER — BUPIVACAINE-EPINEPHRINE (PF) 0.25% -1:200000 IJ SOLN
INTRAMUSCULAR | Status: AC
  Filled 2023-07-18: qty 60

## 2023-07-18 MED ORDER — ACETAMINOPHEN 325 MG PO TABS: 325.0000 mg | ORAL_TABLET | Freq: Four times a day (QID) | ORAL | Status: DC | PRN

## 2023-07-18 MED ORDER — MIDAZOLAM HCL 5 MG/5ML IJ SOLN
INTRAMUSCULAR | Status: DC | PRN
  Administered 2023-07-18: .5 mg via INTRAVENOUS
  Administered 2023-07-18: 1 mg via INTRAVENOUS

## 2023-07-18 MED ORDER — METOCLOPRAMIDE HCL 5 MG/ML IJ SOLN: 5.0000 mg | Freq: Three times a day (TID) | INTRAMUSCULAR | Status: DC | PRN

## 2023-07-18 MED ORDER — DOCUSATE SODIUM 100 MG PO CAPS
100.0000 mg | ORAL_CAPSULE | Freq: Two times a day (BID) | ORAL | Status: DC
  Administered 2023-07-18 – 2023-07-19 (×2): 100 mg via ORAL
  Filled 2023-07-18: qty 1

## 2023-07-18 MED ORDER — KETAMINE HCL 50 MG/5ML IJ SOSY
PREFILLED_SYRINGE | INTRAMUSCULAR | Status: AC
  Filled 2023-07-18: qty 5

## 2023-07-18 MED ORDER — OXYCODONE HCL 5 MG/5ML PO SOLN: 5.0000 mg | Freq: Once | ORAL | Status: AC | PRN

## 2023-07-18 MED ORDER — PROPOFOL 500 MG/50ML IV EMUL
INTRAVENOUS | Status: DC | PRN
  Administered 2023-07-18: 35 ug/kg/min via INTRAVENOUS

## 2023-07-18 MED ORDER — CHLORHEXIDINE GLUCONATE 0.12 % MT SOLN
OROMUCOSAL | Status: AC
  Filled 2023-07-18: qty 15

## 2023-07-18 MED ORDER — CEFAZOLIN SODIUM-DEXTROSE 2-4 GM/100ML-% IV SOLN
INTRAVENOUS | Status: AC
  Filled 2023-07-18: qty 100

## 2023-07-18 MED ORDER — PROPRANOLOL HCL 20 MG PO TABS: 10.0000 mg | ORAL_TABLET | Freq: Four times a day (QID) | ORAL | Status: DC | PRN

## 2023-07-18 MED ORDER — TRAMADOL HCL 50 MG PO TABS: 50.0000 mg | ORAL_TABLET | Freq: Four times a day (QID) | ORAL | Status: DC | PRN

## 2023-07-18 MED ORDER — FENTANYL CITRATE (PF) 100 MCG/2ML IJ SOLN: 25.0000 ug | INTRAMUSCULAR | Status: DC | PRN

## 2023-07-18 MED ORDER — DROPERIDOL 2.5 MG/ML IJ SOLN: 0.6250 mg | Freq: Once | INTRAMUSCULAR | Status: DC | PRN

## 2023-07-18 MED ORDER — SODIUM CHLORIDE (PF) 0.9 % IJ SOLN
INTRAMUSCULAR | Status: DC | PRN
  Administered 2023-07-18: 50 mL via INTRAMUSCULAR

## 2023-07-18 MED ORDER — ONDANSETRON HCL 4 MG/2ML IJ SOLN
INTRAMUSCULAR | Status: DC | PRN
  Administered 2023-07-18: 4 mg via INTRAVENOUS

## 2023-07-18 MED ORDER — ACETAMINOPHEN 10 MG/ML IV SOLN
INTRAVENOUS | Status: DC | PRN
  Administered 2023-07-18: 1000 mg via INTRAVENOUS

## 2023-07-18 MED ORDER — SODIUM CHLORIDE 0.9 % IV SOLN: INTRAVENOUS | Status: DC

## 2023-07-18 MED ORDER — ACETAMINOPHEN 10 MG/ML IV SOLN: 1000.0000 mg | Freq: Once | INTRAVENOUS | Status: DC | PRN

## 2023-07-18 MED ORDER — ACETAMINOPHEN 500 MG PO TABS
1000.0000 mg | ORAL_TABLET | Freq: Three times a day (TID) | ORAL | Status: DC
  Administered 2023-07-18 – 2023-07-19 (×2): 1000 mg via ORAL
  Filled 2023-07-18 (×2): qty 2

## 2023-07-18 MED ORDER — TRANEXAMIC ACID-NACL 1000-0.7 MG/100ML-% IV SOLN
INTRAVENOUS | Status: AC
  Filled 2023-07-18: qty 100

## 2023-07-18 MED ORDER — GABAPENTIN 100 MG PO CAPS
300.0000 mg | ORAL_CAPSULE | Freq: Four times a day (QID) | ORAL | Status: DC
  Administered 2023-07-18 – 2023-07-19 (×4): 300 mg via ORAL
  Filled 2023-07-18: qty 3

## 2023-07-18 MED ORDER — SODIUM CHLORIDE 0.9 % IR SOLN
Status: DC | PRN
  Administered 2023-07-18: 100 mL

## 2023-07-18 MED ORDER — ONDANSETRON HCL 4 MG/2ML IJ SOLN
INTRAMUSCULAR | Status: AC
  Filled 2023-07-18: qty 2

## 2023-07-18 MED ORDER — MUPIROCIN 2 % EX OINT: 1.0000 | TOPICAL_OINTMENT | Freq: Two times a day (BID) | CUTANEOUS | 0 refills | Status: AC

## 2023-07-18 MED ORDER — CHLORHEXIDINE GLUCONATE 4 % EX SOLN: 1.0000 | CUTANEOUS | 1 refills | Status: AC

## 2023-07-18 MED ORDER — LISINOPRIL 5 MG PO TABS
5.0000 mg | ORAL_TABLET | Freq: Every day | ORAL | Status: DC
  Administered 2023-07-19: 5 mg via ORAL

## 2023-07-18 MED ORDER — MORPHINE SULFATE (PF) 2 MG/ML IV SOLN: 0.5000 mg | INTRAVENOUS | Status: DC | PRN

## 2023-07-18 MED ORDER — DOCUSATE SODIUM 100 MG PO CAPS
ORAL_CAPSULE | ORAL | Status: AC
  Filled 2023-07-18: qty 1

## 2023-07-18 MED ORDER — OXYCODONE HCL 5 MG PO TABS
ORAL_TABLET | ORAL | Status: AC
  Filled 2023-07-18: qty 1

## 2023-07-18 MED ORDER — PANTOPRAZOLE SODIUM 40 MG PO TBEC
40.0000 mg | DELAYED_RELEASE_TABLET | Freq: Every day | ORAL | Status: DC
  Administered 2023-07-18 – 2023-07-19 (×2): 40 mg via ORAL
  Filled 2023-07-18 (×2): qty 1

## 2023-07-18 MED ORDER — BUPIVACAINE HCL (PF) 0.5 % IJ SOLN
INTRAMUSCULAR | Status: AC
  Filled 2023-07-18: qty 10

## 2023-07-18 MED ORDER — HYDROCODONE-ACETAMINOPHEN 5-325 MG PO TABS
1.0000 | ORAL_TABLET | ORAL | Status: DC | PRN
  Administered 2023-07-18: 2 via ORAL
  Filled 2023-07-18: qty 2

## 2023-07-18 MED ORDER — CHLORHEXIDINE GLUCONATE 0.12 % MT SOLN
15.0000 mL | Freq: Once | OROMUCOSAL | Status: AC
  Administered 2023-07-18: 15 mL via OROMUCOSAL

## 2023-07-18 MED ORDER — 0.9 % SODIUM CHLORIDE (POUR BTL) OPTIME
TOPICAL | Status: DC | PRN
  Administered 2023-07-18: 500 mL

## 2023-07-18 MED ORDER — PHENYLEPHRINE 80 MCG/ML (10ML) SYRINGE FOR IV PUSH (FOR BLOOD PRESSURE SUPPORT)
PREFILLED_SYRINGE | INTRAVENOUS | Status: DC | PRN
  Administered 2023-07-18 (×6): 80 ug via INTRAVENOUS

## 2023-07-18 MED ORDER — METOCLOPRAMIDE HCL 10 MG PO TABS: 5.0000 mg | ORAL_TABLET | Freq: Three times a day (TID) | ORAL | Status: DC | PRN

## 2023-07-18 MED ORDER — PHENOL 1.4 % MT LIQD: 1.0000 | OROMUCOSAL | Status: DC | PRN

## 2023-07-18 MED ORDER — DULOXETINE HCL 30 MG PO CPEP
60.0000 mg | ORAL_CAPSULE | ORAL | Status: DC
  Administered 2023-07-19: 60 mg via ORAL
  Filled 2023-07-18: qty 2

## 2023-07-18 MED ORDER — ONDANSETRON HCL 4 MG PO TABS: 4.0000 mg | ORAL_TABLET | Freq: Four times a day (QID) | ORAL | Status: DC | PRN

## 2023-07-18 MED ORDER — LIDOCAINE HCL (PF) 2 % IJ SOLN
INTRAMUSCULAR | Status: AC
  Filled 2023-07-18: qty 5

## 2023-07-18 MED ORDER — EPHEDRINE SULFATE-NACL 50-0.9 MG/10ML-% IV SOSY
PREFILLED_SYRINGE | INTRAVENOUS | Status: DC | PRN
  Administered 2023-07-18: 10 mg via INTRAVENOUS

## 2023-07-18 MED ORDER — ONDANSETRON HCL 4 MG/2ML IJ SOLN: 4.0000 mg | Freq: Four times a day (QID) | INTRAMUSCULAR | Status: DC | PRN

## 2023-07-18 MED ORDER — CEFAZOLIN SODIUM-DEXTROSE 3-4 GM/150ML-% IV SOLN
3.0000 g | INTRAVENOUS | Status: AC
Start: 2023-07-18 — End: 2023-07-18
  Administered 2023-07-18: 3 g via INTRAVENOUS
  Filled 2023-07-18: qty 150

## 2023-07-18 MED ORDER — LIDOCAINE HCL (CARDIAC) PF 100 MG/5ML IV SOSY
PREFILLED_SYRINGE | INTRAVENOUS | Status: DC | PRN
  Administered 2023-07-18: 100 mg via INTRAVENOUS

## 2023-07-18 MED ORDER — ORAL CARE MOUTH RINSE: 15.0000 mL | Freq: Once | OROMUCOSAL | Status: AC

## 2023-07-18 MED ORDER — BUPIVACAINE LIPOSOME 1.3 % IJ SUSP
INTRAMUSCULAR | Status: AC
  Filled 2023-07-18: qty 40

## 2023-07-18 MED ORDER — MIDAZOLAM HCL 2 MG/2ML IJ SOLN
INTRAMUSCULAR | Status: AC
  Filled 2023-07-18: qty 2

## 2023-07-18 MED ORDER — OXYCODONE HCL 5 MG PO TABS
5.0000 mg | ORAL_TABLET | Freq: Once | ORAL | Status: AC | PRN
  Administered 2023-07-18: 5 mg via ORAL

## 2023-07-18 SURGICAL SUPPLY — 63 items
BLADE CLIPPER SURG (BLADE) IMPLANT
BLADE SAGITTAL AGGR TOOTH XLG (BLADE) ×1 IMPLANT
BNDG COHESIVE 6X5 TAN ST LF (GAUZE/BANDAGES/DRESSINGS) ×1 IMPLANT
BRUSH SCRUB EZ PLAIN DRY (MISCELLANEOUS) ×1 IMPLANT
CHLORAPREP W/TINT 26 (MISCELLANEOUS) ×1 IMPLANT
DERMABOND ADVANCED .7 DNX12 (GAUZE/BANDAGES/DRESSINGS) ×1 IMPLANT
DRAPE C-ARM XRAY 36X54 (DRAPES) ×1 IMPLANT
DRAPE SHEET LG 3/4 BI-LAMINATE (DRAPES) ×3 IMPLANT
DRAPE TABLE BACK 80X90 (DRAPES) ×1 IMPLANT
DRSG MEPILEX SACRM 8.7X9.8 (GAUZE/BANDAGES/DRESSINGS) ×1 IMPLANT
DRSG OPSITE POSTOP 4X8 (GAUZE/BANDAGES/DRESSINGS) ×1 IMPLANT
ELECT BLADE 4.0 EZ CLEAN MEGAD (MISCELLANEOUS) ×1 IMPLANT
ELECT REM PT RETURN 9FT ADLT (ELECTROSURGICAL) ×1 IMPLANT
ELECTRODE BLDE 4.0 EZ CLN MEGD (MISCELLANEOUS) ×1 IMPLANT
ELECTRODE REM PT RTRN 9FT ADLT (ELECTROSURGICAL) ×1 IMPLANT
GLOVE BIO SURGEON STRL SZ8 (GLOVE) ×1 IMPLANT
GLOVE BIOGEL PI IND STRL 8 (GLOVE) ×1 IMPLANT
GLOVE PI ORTHO PRO STRL 7.5 (GLOVE) ×2 IMPLANT
GLOVE PI ORTHO PRO STRL SZ8 (GLOVE) ×2 IMPLANT
GLOVE SURG SYN 7.5 E (GLOVE) ×1 IMPLANT
GLOVE SURG SYN 7.5 PF PI (GLOVE) ×1 IMPLANT
GOWN SRG XL LVL 3 NONREINFORCE (GOWNS) ×1 IMPLANT
GOWN STRL REUS W/ TWL LRG LVL3 (GOWN DISPOSABLE) ×1 IMPLANT
GOWN STRL REUS W/ TWL XL LVL3 (GOWN DISPOSABLE) ×1 IMPLANT
HEAD CERAMIC FEMORAL 36MM (Head) IMPLANT
HOOD PEEL AWAY T7 (MISCELLANEOUS) ×2 IMPLANT
INSERT TRIDENT POLY 36 0DEG (Insert) IMPLANT
IV NS 100ML SINGLE PACK (IV SOLUTION) ×1 IMPLANT
KIT PATIENT CARE HANA TABLE (KITS) ×1 IMPLANT
KIT TURNOVER CYSTO (KITS) ×1 IMPLANT
LIGHT WAVEGUIDE WIDE FLAT (MISCELLANEOUS) ×1 IMPLANT
MANIFOLD NEPTUNE II (INSTRUMENTS) ×1 IMPLANT
MARKER SKIN DUAL TIP RULER LAB (MISCELLANEOUS) ×1 IMPLANT
MAT ABSORB FLUID 56X50 GRAY (MISCELLANEOUS) ×1 IMPLANT
NDL SPNL 20GX3.5 QUINCKE YW (NEEDLE) ×1 IMPLANT
NEEDLE SPNL 20GX3.5 QUINCKE YW (NEEDLE) ×1 IMPLANT
NS IRRIG 500ML POUR BTL (IV SOLUTION) ×1 IMPLANT
PACK HIP COMPR (MISCELLANEOUS) ×1 IMPLANT
PAD ARMBOARD 7.5X6 YLW CONV (MISCELLANEOUS) ×1 IMPLANT
PENCIL SMOKE EVACUATOR (MISCELLANEOUS) ×1 IMPLANT
SCREW HEX LP 6.5X25 (Screw) IMPLANT
SCREW HEX LP 6.5X30 (Screw) IMPLANT
SHELL CLUSTERHOLE ACETABULAR 5 (Shell) IMPLANT
SLEEVE SCD COMPRESS KNEE MED (STOCKING) ×1 IMPLANT
SOLUTION IRRIG SURGIPHOR (IV SOLUTION) ×1 IMPLANT
SPONGE T-LAP 18X18 ~~LOC~~+RFID (SPONGE) IMPLANT
STEM HIGH OFFSET SZ4 36X103 (Stem) IMPLANT
SURGIFLO W/THROMBIN 8M KIT (HEMOSTASIS) IMPLANT
SUT BONE WAX W31G (SUTURE) ×1 IMPLANT
SUT ETHIBOND 2 V 37 (SUTURE) ×1 IMPLANT
SUT SILK 0 30XBRD TIE 6 (SUTURE) ×1 IMPLANT
SUT STRATA 1 CT-1 DLB (SUTURE) ×1 IMPLANT
SUT STRATAFIX 14 PDO 48 VLT (SUTURE) ×1 IMPLANT
SUT STRATAFIX PDO 1 14 VIOLET (SUTURE) ×1 IMPLANT
SUT VIC AB 0 CT1 36 (SUTURE) ×1 IMPLANT
SUT VIC AB 2-0 CT2 27 (SUTURE) ×1 IMPLANT
SUTURE STRATA SPIR 4-0 18 (SUTURE) ×1 IMPLANT
SYR 20ML LL LF (SYRINGE) ×2 IMPLANT
TAPE MICROFOAM 4IN (TAPE) IMPLANT
TOWEL OR 17X26 4PK STRL BLUE (TOWEL DISPOSABLE) IMPLANT
TRAP FLUID SMOKE EVACUATOR (MISCELLANEOUS) ×1 IMPLANT
WAND WEREWOLF FASTSEAL 6.0 (MISCELLANEOUS) ×1 IMPLANT
WATER STERILE IRR 1000ML POUR (IV SOLUTION) ×1 IMPLANT

## 2023-07-18 NOTE — H&P (Signed)
 History of Present Illness: Lance [name] is an 73 y.o. male presents for history and physical for left direct anterior total hip arthroplasty with Dr. Audelia Acton on 07/18/2023. Patient has severe left hip pain, 10 out of 10 that has been getting worse over the last couple years. He describes sharp shooting and throbbing pain in the groin. His pain is worse with walking and is limiting his ability to ambulate. He has had some relief with an injection back in November 2020 for his left hip. Patient's x-rays show advanced left hip osteoarthritis. Patient's pain has been increasing and interfering with his quality of life and activities daily living. The patient denies fevers, chills, numbness, tingling, shortness of breath, chest pain, recent illness, or any trauma.  Patient is a non-smoker, nondiabetic with no history of any metal allergy or reactions and has had a successful knee replacement antral replacement in the past. He is continue to lose weight and his BMI is down to 40 and his A1c is 6.7. He does have a remote history of DVT almost 20 years ago. 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  Iron deficiency anemia due to chronic blood loss  Meralgia paresthetica  Obesity  Serrated adenoma of colon 07/14/2015  Sleep apnea  uses CPAP 10 cm  SNHL (sensorineural hearing loss)  Tubular adenoma of colon 07/14/2015   Past Surgical History: Past Surgical History:  Procedure Laterality Date  TONSILLECTOMY 1958  Pin in foot, ear surgery 1965  SHOULDER SURGERY Right 1968  fatty tumor (R) leg 1974  VASECTOMY 1979  HEMORRHOID SURGERY 1984  HERNIA REPAIR 1985  x2  HYDROCELE EXCISION / REPAIR 1985  x2  FOOT SURGERY Left 2003  complicated by DVT  Bilateral carpal tunnel release 2011  holmium laser enucleation of prostate 08/28/2014  Dr. Vanna Scotland  ARTHROPLASTY TOTAL SHOULDER Right 09/18/2014  Procedure: ARTHROPLASTY, GLENOHUMERAL JOINT; TOTAL SHOULDER (GLENOID AND PROXIMAL HUMERAL REPLACEMENT (EG, TOTAL SHOULDER); Surgeon: Margaretha Sheffield, MD; Location: [facility] OR; Service: Orthopedics; Laterality: Right;  COLONOSCOPY 07/14/2015  Tubular adenoma/Serrated adenoma of colon/Repeat 63yrs/MUS  EXTRACTION CATARACT EXTRACAPSULAR W/INSERTION INTRAOCULAR PROSTHESIS Right 10/17/2017  Dr. Inez Pilgrim  COLONOSCOPY 02/02/2019  Tubular adenoma of the colon/Hyperplastic colon polyp/Repeat 33yrs/MUS  REVISION TOTAL SHOULDER ARTHROPLASTY Right 05/16/2019  Procedure: REVISION SHOULDER ARTHROPLASTY; Surgeon: Margaretha Sheffield, MD; Location: [facility] OR; Service: Orthopedics; Laterality: Right;  OSTEOTOMY CLAVICLE Right 04/09/2020  Procedure: HUMERAL HEAD EXCHANGE, GLENOID/SHOULDER DEBRIDEMENT RIGHT SHOULDER; Surgeon: Margaretha Sheffield, MD; Location: [facility] OR; Service: Orthopedics; Laterality: Right;  ROBOT ASSISTED LAPAROSCOPIC REPAIR INGUINAL HERNIA Right 10/01/2022  Dr. Sung Amabile --- w/ mesh   EYE SURGERY  FRACTURE SURGERY  right shoulder repair  JOINT REPLACEMENT  left total knee replacement Dr. Sherin Quarry   Past Family History: Family History  Problem Relation Age of Onset  Arthritis Mother  Asthma Mother  deceased  Rheum arthritis Mother  deceased  Skin cancer Father  Clotting disorder Father  Stroke Father  deceased   Medications: Current Outpatient Medications  Medication Sig Dispense Refill  albuterol 90 mcg/actuation inhaler Inhale 2 inhalations into the lungs every 4 (four) hours as needed for Wheezing or Shortness of Breath. 1 Inhaler 5  buPROPion (WELLBUTRIN XL) 150 MG XL tablet Take 1 tablet (150 mg total) by mouth once daily (For anorgasmia) 30 tablet 11  cetirizine (ZYRTEC) 10 MG tablet Take 10 mg by mouth once daily as needed for Allergies.  chlorhexidine (PERIDEX) 0.12 % solution Take 15 mLs by mouth 2 (two) times daily  cholecalciferol (VITAMIN D3) 5,000 unit capsule Take 1 capsule (5,000 Units total) by mouth once daily for Vitamin D Deficiency. 360 capsule 11  cyanocobalamin (VITAMIN B12) 1000 MCG tablet TAKE 2 TABLETS DAILY FOR 2 WEEKS, THEN REDUCE TO 1 TABLET DAILY THEREAFTER FOR VITAMIN B12 DEFICIENCY. 90 tablet 3  cyclobenzaprine (FLEXERIL) 5 MG tablet Take 1-2 tablets (5-10 mg total) by mouth nightly as needed for Muscle spasms 90 tablet 1  DULoxetine (CYMBALTA) 60 MG DR capsule TAKE 1 CAPSULE EVERY DAY 90 capsule 1  fluticasone (FLONASE) 50 mcg/actuation nasal spray Place 1-2 sprays into both nostrils once daily as needed 48 g 3  gabapentin (NEURONTIN) 100 MG capsule Take 100 mg by mouth 4 (four) times daily  gabapentin (NEURONTIN) 300 MG capsule TAKE 1 CAPSULE (300 MG TOTAL) BY MOUTH 4 (FOUR) TIMES DAILY WITH MEALS AND NIGHTLY 360 capsule 3  HYDROcodone-acetaminophen (NORCO) 5-325 mg tablet Take 1 tablet by mouth every 6 (six) hours as needed for Pain 120 tablet 0  lisinopriL (ZESTRIL) 5 MG tablet TAKE 1 TABLET ONE TIME DAILY 90 tablet 3   metFORMIN (GLUCOPHAGE-XR) 500 MG XR tablet TAKE 2 TABLETS EVERY DAY WITH DINNER 180 tablet 3  multivitamin tablet Take 1 tablet by mouth once daily  ONETOUCH DELICA LANCETS 33 gauge Misc 11  pantoprazole (PROTONIX) 40 MG DR tablet TAKE 1 TABLET TWICE DAILY FOR REFLUX 180 tablet 1  pravastatin (PRAVACHOL) 20 MG tablet TAKE 1 TABLET EVERY NIGHT 90 tablet 3  predniSONE (DELTASONE) 5 MG tablet Take as directed - 6 day taper 21 tablet 0  propranoloL (INDERAL LA) 60 MG LA capsule Take 1 capsule (60 mg total) by mouth once daily 30 capsule 11  propranoloL (INDERAL) 10 MG tablet Take 1 tablet (10 mg total) by mouth 4 (four) times daily as needed For Tremors 120 tablet 11  psyllium, aspartame, (METAMUCIL) 3.4 gram packet Take by mouth  tadalafiL (CIALIS) 20 MG tablet Take 10 mg by mouth once daily as needed for Erectile Dysfunction  tamsulosin (FLOMAX) 0.4 mg capsule Take capsule by mouth once daily 2  testosterone (TESTOPEL) 75 mg pellet Inject subcutaneously  testosterone cypionate (DEPO-TESTOSTERONE) 200 mg/mL injection Inject 200 mg into the muscle every 14 (fourteen) days  TORsemide (DEMADEX) 5 MG tablet Take 1 tablet (5 mg total) by mouth once daily as needed (Patient not taking: Reported on 10/13/2022) 30 tablet 11  triamcinolone 0.025 % cream  warfarin (COUMADIN) 5 MG tablet ON MONDAY, WEDNESDAY AND FRIDAY TAKE 1 TABLET BY MOUTH; ALL OTHER DAYS TAKE 1/2 TABLET BY MOUTH AS DIRECTED. 65 tablet 3   No current facility-administered medications for this visit.   Allergies: Allergies  Allergen  Reactions  Penicillins Other (See Comments) and Rash  Had reaction while a baby. Has previously tolerated Ancef Also tolerated amoxicillin Other reaction(s): UNKNOWN Other reaction(s):unknown,as a child Had reaction while a baby. Has previously tolerated Ancef    Visit Vitals: Vitals:  07/06/23 1102  BP: 120/62    Review of Systems:  A comprehensive 14 point ROS was performed, reviewed, and the  pertinent orthopaedic findings are documented in the HPI.  Physical Exam: Body mass index is 41.45 kg/m. Vitals:  07/06/23 1102  BP: 120/62   General:  Well developed, well nourished, no apparent distress, normal affect, antalgic gait with a cane HEENT: Head normocephalic, atraumatic, PERRL.   Abdomen: Soft, non tender, non distended, Bowel sounds present.  Heart: Examination of the heart reveals regular, rate, and rhythm. There is no murmur noted on ascultation. There is a normal apical pulse.  Lungs: Lungs are clear to auscultation. There is no wheeze, rhonchi, or crackles. There is normal expansion of bilateral chest walls.   Left hip exam  SKIN: intact SWELLING: none WARMTH: no warmth TENDERNESS: none, Stinchfield Positive bilaterally ROM: 0 degrees of internal rotation and 30 degrees of external rotation with 90 degrees of flexion of the left hip. There is pain with internal rotation and flexion localized to the groin of the left hip.  STRENGTH: normal GAIT: stiff-legged with a cane STABILITY: stable to testing CREPITUS: no LEG LENGTH DISCREPANCY: none NEUROLOGICAL EXAM: normal VASCULAR EXAM: normal LUMBAR SPINE: tenderness: yes paraspinal straight leg raising sign: no motor exam: normal  Hip Imaging :  I have reviewed AP pelvis and lateral hip X-rays (5 views) taken today in the office of the bilateral hips which reveal moderate degenerative changes of both hips worse on the left with medial joint space narrowing with osteophyte formation, sclerosis, and early subchondral cyst formation. There are cam deformities of both hips with acetabular sclerosis and osteophyte formation. Moderate degenerative changes of both hips worse on the left. No fractures or dislocations noted.   Assessment:  Advanced left hip osteoarthritis  Plan: Kasim is a 73 year old male presents with advanced left hip osteoarthritis. Pain is severe, debilitating and interfering with his  quality of life and activities daily living. He has x-rays confirming significant left hip osteoarthritis. He said no relief with conservative treatment. He is seen Dr. Audelia Acton, discussed total hip arthroplasty risk benefits and complications and had agreed and consented to the procedure with Dr. Audelia Acton on 07/18/2023  The hospitalization and post-operative care and rehabilitation were also discussed. The use of perioperative antibiotics and DVT prophylaxis were discussed. The risk, benefits and alternatives to a surgical intervention were discussed at length with the patient. The patient was also advised of risks related to the medical comorbidities and elevated body mass index (BMI). A lengthy discussion took place to review the most common complications including but not limited to: deep vein thrombosis, pulmonary embolus, heart attack, stroke, infection, wound breakdown, heterotopic ossification, dislocation, numbness, leg length in-equality, intraoperative fracture, damage to nerves, tendon,muscles, arteries or other blood vessels, death and other possible complications from anesthesia. The patient was told that we will take steps to minimize these risks by using sterile technique, antibiotics and DVT prophylaxis when appropriate and follow the patient postoperatively in the office setting to monitor progress. The possibility of recurrent pain, no improvement in pain and actual worsening of pain were also discussed with the patient. The risk of dislocation following total hip replacement was discussed and potential precautions to prevent dislocation were reviewed.  All questions answered patient agrees with above plan for left anterior total hip arthroplasty.

## 2023-07-18 NOTE — Progress Notes (Signed)
 Patient awake/alert x4 . Moving bilateral lower ext. Strength intact, able to bend knee's without event. Left hip dressing c/d/I. Pulses intact Medicated for discomfort as ordered.

## 2023-07-18 NOTE — Transfer of Care (Signed)
 Immediate Anesthesia Transfer of Care Note  Patient: Lance [name]  Procedure(s) Performed: ARTHROPLASTY, HIP, TOTAL, ANTERIOR APPROACH (Left: Hip)  Patient Location: PACU  Anesthesia Type:Spinal  Level of Consciousness: sedated  Airway & Oxygen Therapy: Patient Spontanous Breathing and Patient connected to face mask oxygen  Post-op Assessment: Report given to RN and Post -op Vital signs reviewed and stable  Post vital signs: Reviewed and stable  Last Vitals:  Vitals Value Taken Time  BP 129/71 07/18/23 1023  Temp 36.5 C 07/18/23 1023  Pulse 75 07/18/23 1027  Resp 15 07/18/23 1027  SpO2 100 % 07/18/23 1027  Vitals shown include unfiled device data.  Last Pain:  Vitals:   07/18/23 1025  TempSrc:   PainSc: Asleep         Complications: No notable events documented.

## 2023-07-18 NOTE — Anesthesia Procedure Notes (Signed)
 Spinal  Patient location during procedure: OR Start time: 07/18/2023 7:36 AM End time: 07/18/2023 7:46 AM Reason for block: surgical anesthesia Staffing Performed: resident/CRNA and anesthesiologist  Anesthesiologist: Foye Deer, MD Resident/CRNA: Otho Perl, CRNA Performed by: Otho Perl, CRNA Authorized by: Foye Deer, MD   Preanesthetic Checklist Completed: patient identified, IV checked, site marked, risks and benefits discussed, surgical consent, monitors and equipment checked, pre-op evaluation and timeout performed Spinal Block Patient position: sitting Prep: ChloraPrep Patient monitoring: cardiac monitor, heart rate, continuous pulse ox and blood pressure Approach: midline Location: L2-3 Injection technique: single-shot Needle Needle type: Pencan  Needle gauge: 24 G Needle length: 9 cm Needle insertion depth: 9 cm Assessment Sensory level: T10 Events: CSF return Additional Notes Attempts x3 at midline L3-4, L2-3, right paramedian L2-3 without success per S. Makynzie Dobesh, Scientist, clinical (histocompatibility and immunogenetics).  Success at midline L2-3 per Dr. Laural Benes. CSF return but did not aspirate.  Lot# [id number] Exp 6/26

## 2023-07-18 NOTE — Plan of Care (Signed)
 Verbalizes understanding of procedure, plan of care/discharge

## 2023-07-18 NOTE — Evaluation (Signed)
 Physical Therapy Evaluation Patient Details Name: Lance [name] MRN: [id number] DOB: [birth year] Today's Date: 07/18/2023  History of Present Illness  Pt is a 73 yo male s/p L THA. PMH of HTN, DM, GERD, DVT, HOH, PE, TKA.  Clinical Impression  Patient alert, agreeable to PT, 5/10 L hip pain. Stated at baseline he lives with family and his two daughters also plan on being available to assist if needed, normally independent in ADLs and ambulation.   He was able to transition to sitting EOB with minA for LLE and trunk elevation with handheld assist. Able to sit with fair balance. Sit <> stand with CGAx2 initially, but able to transition to CGAx1. Able to step forwards/backwards/laterally ~25ft total prior to sitting in recliner with CGA. Pt then able to perform a few seated therex.  Pt up in recliner with needs in reach. Overall the patient demonstrated deficits (see "PT Problem List") that impede the patient's functional abilities, safety, and mobility and would benefit from skilled PT intervention.          If plan is discharge home, recommend the following: A little help with walking and/or transfers;A little help with bathing/dressing/bathroom;Assistance with cooking/housework;Assist for transportation;Help with stairs or ramp for entrance   Can travel by private vehicle        Equipment Recommendations None recommended by PT (pt has bari BSC and RW at home)  Recommendations for Other Services       Functional Status Assessment Patient has had a recent decline in their functional status and demonstrates the ability to make significant improvements in function in a reasonable and predictable amount of time.     Precautions / Restrictions Precautions Precautions: Fall;Anterior Hip Precaution Booklet Issued: No Recall of Precautions/Restrictions: Intact Restrictions Weight Bearing Restrictions Per Provider Order: Yes LLE Weight Bearing Per Provider Order: Weight bearing as  tolerated      Mobility  Bed Mobility Overal bed mobility: Needs Assistance Bed Mobility: Supine to Sit     Supine to sit: Min assist     General bed mobility comments: minA for LLE assist and trunk elevation via handheld assist    Transfers Overall transfer level: Needs assistance Equipment used: Rolling walker (2 wheels) Transfers: Sit to/from Stand Sit to Stand: +2 safety/equipment, Contact guard assist                Ambulation/Gait Ambulation/Gait assistance: Contact guard assist, +2 safety/equipment Gait Distance (Feet): 10 Feet Assistive device: Rolling walker (2 wheels)         General Gait Details: able to step fowards, backwards, forwards and laterally in room, CGA  Stairs            Wheelchair Mobility     Tilt Bed    Modified Rankin (Stroke Patients Only)       Balance Overall balance assessment: Needs assistance Sitting-balance support: Feet supported Sitting balance-Leahy Scale: Fair     Standing balance support: Bilateral upper extremity supported Standing balance-Leahy Scale: Fair                               Pertinent Vitals/Pain Pain Assessment Pain Assessment: 0-10 Pain Score: 5  Pain Location: L hip Pain Descriptors / Indicators: Sore Pain Intervention(s): Limited activity within patient's tolerance, Monitored during session, Repositioned    Home Living Family/patient expects to be discharged to:: Private residence Living Arrangements: Spouse/significant other Available Help at Discharge: Family Type of Home: House Home  Access: Stairs to enter   [employer] of Steps: 1+1   Home Layout: One level Home Equipment: Agricultural consultant (2 wheels);BSC/3in1;Grab bars - tub/shower      Prior Function Prior Level of Function : Independent/Modified Independent             Mobility Comments: no AD for ambulation ADLs Comments: I for ADLs     Extremity/Trunk Assessment   Upper Extremity  Assessment Upper Extremity Assessment: Overall WFL for tasks assessed    Lower Extremity Assessment Lower Extremity Assessment:  (able to lift BLE from bed, LLE effortful)       Communication        Cognition Arousal: Alert Behavior During Therapy: WFL for tasks assessed/performed   PT - Cognitive impairments: No apparent impairments                                 Cueing       General Comments      Exercises Total Joint Exercises Ankle Circles/Pumps:  (unable to L ankle pump due to ankle fusion) Long Arc Quad: AROM, Strengthening, Left, 10 reps Marching in Standing: Seated, Left, 10 reps   Assessment/Plan    PT Assessment Patient needs continued PT services  PT Problem List Decreased strength;Decreased activity tolerance;Decreased balance;Decreased mobility;Decreased range of motion;Pain       PT Treatment Interventions DME instruction;Balance training;Gait training;Stair training;Neuromuscular re-education;Functional mobility training;Patient/family education;Therapeutic activities;Therapeutic exercise    PT Goals (Current goals can be found in the Care Plan section)  Acute Rehab PT Goals Patient Stated Goal: to go home PT Goal Formulation: With patient Time For Goal Achievement: 08/01/23 Potential to Achieve Goals: Good    Frequency BID     Co-evaluation               AM-PAC PT "6 Clicks" Mobility  Outcome Measure Help needed turning from your back to your side while in a flat bed without using bedrails?: A Little Help needed moving from lying on your back to sitting on the side of a flat bed without using bedrails?: A Little Help needed moving to and from a bed to a chair (including a wheelchair)?: A Little Help needed standing up from a chair using your arms (e.g., wheelchair or bedside chair)?: A Little Help needed to walk in hospital room?: A Little Help needed climbing 3-5 steps with a railing? : A Little 6 Click Score: 18     End of Session Equipment Utilized During Treatment: Gait belt Activity Tolerance: Patient tolerated treatment well Patient left: with call bell/phone within reach;in chair;with family/visitor present Nurse Communication: Mobility status PT Visit Diagnosis: Other abnormalities of gait and mobility (R26.89);Difficulty in walking, not elsewhere classified (R26.2);Muscle weakness (generalized) (M62.81);Pain Pain - Right/Left: Left Pain - part of body: Hip    Time: 6578-4696 PT Time Calculation (min) (ACUTE ONLY): 21 min   Charges:   PT Evaluation $PT Eval Low Complexity: 1 Low PT Treatments $Therapeutic Activity: 8-22 mins PT General Charges $$ ACUTE PT VISIT: 1 Visit        Olga Coaster PT, DPT 3:44 PM,07/18/23

## 2023-07-18 NOTE — Consult Note (Signed)
 PHARMACY - ANTICOAGULATION CONSULT NOTE  Pharmacy Consult for warfarin Indication: Hx of PE/DVT  Allergies  Allergen Reactions   Penicillins     Other reaction(s):unknown,as a child    Patient Measurements: Height: 6' (182.9 cm) Weight: 135.2 kg (298 lb) IBW/kg (Calculated) : 77.6  Vital Signs: Temp: 97.8 F (36.6 C) (03/10 1346) Temp Source: Temporal (03/10 1346) BP: 147/79 (03/10 1346) Pulse Rate: 81 (03/10 1346)  Labs: Recent Labs    07/18/23 0625  LABPROT 14.3  INR 1.1    Estimated Creatinine Clearance: 104 mL/min (by C-G formula based on SCr of 0.9 mg/dL).   Medical History: Past Medical History:  Diagnosis Date   Acute cystitis    Allergic rhinitis    BPH (benign prostatic hyperplasia)    Bronchitis    chronic   Carpal tunnel syndrome    Complication of anesthesia    delayed emergence after fatty tumor surgery   DDD (degenerative disc disease), lumbar    whole body, joints   Deficiency, protein S (HCC)    Degenerative lumbar spinal stenosis    chronic back pain, left leg and foot   Diabetes mellitus (HCC)    diet controlled   DISH (diffuse idiopathic skeletal hyperostosis)    neck pain   Dyspnea    with exertion, wheezing occasionally   Erectile dysfunction    Factor V Leiden (HCC)    GERD (gastroesophageal reflux disease)    Gout    Headache    migraines-occasionally   History of DVT (deep vein thrombosis)    left leg-after left foot surgery   History of migraine headaches    History of pulmonary embolism    bilateral   HOH (hard of hearing)    wears aids   Hypercholesterolemia    Hypercoagulable state (HCC)    Factor V   Hypertension    controlled on meds   Hypogonadism in male    Infection of skin    Iron deficiency anemia    Lower urinary tract symptoms    Meralgia paresthetica    Neck pain    Obesity    OSA on CPAP    Post-void dribbling    Prostatitis    Pulmonary embolism (HCC)    after carpal tunnel surgery   SNHL  (sensorineural hearing loss)    Vertigo    occasional    Medications:  Medications Prior to Admission  Medication Sig Dispense Refill Last Dose/Taking   Cholecalciferol (VITAMIN D-3) 125 MCG (5000 UT) TABS Take 5,000 Units by mouth daily.   Past Week   cyanocobalamin (VITAMIN B12) 1000 MCG tablet Take 1,000 mcg by mouth daily.   Past Week   DULoxetine (CYMBALTA) 60 MG capsule Take 60 mg by mouth every morning.   07/18/2023 at  4:00 AM   fluticasone (FLONASE) 50 MCG/ACT nasal spray Place 2 sprays into both nostrils daily as needed for allergies.   Past Month   gabapentin (NEURONTIN) 300 MG capsule Take 300 mg by mouth 4 (four) times daily.    07/18/2023 at  4:00 AM   HYDROcodone-acetaminophen (NORCO/VICODIN) 5-325 MG tablet Take 1 tablet by mouth every 6 (six) hours. 6 tablet 0 07/18/2023 at  4:00 AM   lisinopril (ZESTRIL) 5 MG tablet Take 5 mg by mouth daily.   07/18/2023 at  4:00 AM   metFORMIN (GLUCOPHAGE-XR) 500 MG 24 hr tablet Take 1,000 mg by mouth every evening.   Past Week   pantoprazole (PROTONIX) 40 MG tablet Take 40 mg  by mouth 2 (two) times daily.   07/18/2023 at  4:00 AM   pravastatin (PRAVACHOL) 20 MG tablet Take 20 mg by mouth at bedtime.   07/17/2023   propranolol (INDERAL) 10 MG tablet Take 10 mg by mouth 4 (four) times daily as needed (Takes for Tremors).   07/18/2023 at  4:00 AM   tadalafil (CIALIS) 20 MG tablet Take 1 tablet (20 mg total) by mouth daily as needed for erectile dysfunction. 30 tablet 3 Past Week   testosterone cypionate (DEPOTESTOSTERONE CYPIONATE) 200 MG/ML injection Inject 1 mL (200 mg total) into the muscle every 14 (fourteen) days. 10 mL 0 07/15/2023   warfarin (COUMADIN) 5 MG tablet Take 2.5-5 mg by mouth daily. 5 mg M, W, F and 2.5 mg on T, Th, Sat and Sun   07/12/2023   cetirizine (ZYRTEC) 10 MG tablet Take 10 mg by mouth daily as needed for allergies. (Patient not taking: Reported on 07/18/2023)   Not Taking   cyclobenzaprine (FLEXERIL) 5 MG tablet Take 5 mg by  mouth 3 (three) times daily as needed for muscle spasms. (Patient not taking: Reported on 07/07/2023)   Not Taking   NEEDLE, DISP, 18 G (BD DISP NEEDLES) 18G X 1-1/2" MISC 1 mg by Does not apply route every 14 (fourteen) days. 50 each 0    NEEDLE, DISP, 21 G (BD DISP NEEDLES) 21G X 1-1/2" MISC 1 mg by Does not apply route every 14 (fourteen) days. 50 each 0    Syringe, Disposable, (2-3CC SYRINGE) 3 ML MISC 1 mg by Does not apply route every 14 (fourteen) days. 25 each 3    tamsulosin (FLOMAX) 0.4 MG CAPS capsule Take 0.4 mg by mouth daily. (Patient not taking: Reported on 07/14/2023)   Not Taking   Scheduled:   acetaminophen  1,000 mg Oral Q8H    ceFAZolin (ANCEF) IV  3 g Intravenous Q6H   docusate sodium  100 mg Oral BID   [START ON 07/19/2023] DULoxetine  60 mg Oral [id number]   [START ON 07/19/2023] enoxaparin (LOVENOX) injection  30 mg Subcutaneous Q12H   gabapentin  300 mg Oral QID   ketorolac  7.5 mg Intravenous Q6H   [START ON 07/19/2023] lisinopril  5 mg Oral Daily   pantoprazole  40 mg Oral Daily   pravastatin  20 mg Oral Daily   Infusions:   sodium chloride     PRN: [START ON 07/19/2023] acetaminophen, HYDROcodone-acetaminophen, menthol-cetylpyridinium **OR** phenol, metoCLOPramide **OR** metoCLOPramide (REGLAN) injection, morphine injection, ondansetron **OR** ondansetron (ZOFRAN) IV, propranolol, traMADol Anti-infectives (From admission, onward)    Start     Dose/Rate Route Frequency Ordered Stop   07/18/23 1400  ceFAZolin (ANCEF) IVPB 3g/150 mL premix        3 g 300 mL/hr over 30 Minutes Intravenous Every 6 hours 07/18/23 1345 07/19/23 0159   07/18/23 0615  ceFAZolin (ANCEF) IVPB 3g/150 mL premix        3 g 300 mL/hr over 30 Minutes Intravenous On call to O.R. 07/18/23 9147 07/18/23 0755       Assessment: 73 y.o. male presents for history and physical for left direct anterior total hip arthroplasty with Dr. Audelia Acton on 07/18/2023. Home dose: warfarin 5 mg MWF and 2.5 mg TThSaSu.  Consult said pt has hx of APLS, but I do not see any PMH of it in previous notes. Baseline INR 1.1. s/p Arthroplasty Total hip 3/10. DDI cefazolin with end date (increase INR), duloxetine (increase bleeding), enoxaparin (increase bleeding).   Goal  of Therapy:  INR 2-3 Monitor platelets by anticoagulation protocol: Yes   Plan:  INR is supratherapeutic. Will give warfarin 7.5 mg x 1 (~50 mg increase from home dose). Daily INR. CBC at least every 3 days. Continue enoxaparin 30 mg BID for DVT ppx units INR > 2.   Ronnald Ramp, PharmD, BCPS 07/18/2023,1:47 PM

## 2023-07-18 NOTE — Interval H&P Note (Signed)
 Patient history and physical updated. Consent reviewed including risks, benefits, and alternatives to surgery. He had his IVC filter placed without complication. We will restart his coumadin tonight and plan to bridge with lovenox. Patient agrees with above plan to proceed with left anterior total hip arthroplasty.

## 2023-07-18 NOTE — Anesthesia Preprocedure Evaluation (Addendum)
 Anesthesia Evaluation  Patient identified by MRN, date of birth, ID band Patient awake    Reviewed: Allergy & Precautions, NPO status , Patient's Chart, lab work & pertinent test results  History of Anesthesia Complications (+) PROLONGED EMERGENCE and history of anesthetic complications  Airway Mallampati: IV  TM Distance: >3 FB Neck ROM: full    Dental  (+) Dental Advidsory Given, Poor Dentition   Pulmonary shortness of breath and with exertion, sleep apnea and Continuous Positive Airway Pressure Ventilation , Not current smoker   Pulmonary exam normal        Cardiovascular hypertension, Pt. on medications (-) Past MI Normal cardiovascular exam(-) dysrhythmias (-) Valvular Problems/Murmurs     Neuro/Psych neg Seizures  Neuromuscular disease (lower extremity neuropathy)  negative psych ROS   GI/Hepatic Neg liver ROS,GERD  Medicated and Controlled,,  Endo/Other  diabetes, Type 2, Oral Hypoglycemic Agents  Class 3 obesity  Renal/GU negative Renal ROS     Musculoskeletal  (+) Arthritis ,    Abdominal  (+) + obese  Peds  Hematology negative hematology ROS (+)   Anesthesia Other Findings  remote history of DVT/PE almost 20 years ago with clotting disorder protein S deficiency on warfarin.   Past Medical History: No date: Acute cystitis No date: Allergic rhinitis No date: BPH (benign prostatic hyperplasia) No date: Bronchitis     Comment:  chronic No date: Carpal tunnel syndrome No date: CHF (congestive heart failure) (HCC) No date: Complication of anesthesia     Comment:  delayed emergence after fatty tumor surgery No date: DDD (degenerative disc disease), lumbar     Comment:  whole body, joints No date: Deficiency, protein S (HCC) No date: Degenerative lumbar spinal stenosis     Comment:  chronic back pain, left leg and foot No date: Diabetes mellitus (HCC)     Comment:  diet controlled No date: DISH (diffuse  idiopathic skeletal hyperostosis)     Comment:  neck pain No date: Dyspnea     Comment:  with exertion, wheezing occasionally No date: Erectile dysfunction No date: GERD (gastroesophageal reflux disease) No date: Gout No date: Headache     Comment:  migraines-occasionally No date: History of DVT (deep vein thrombosis)     Comment:  left leg-after left foot surgery No date: History of migraine headaches No date: History of pulmonary embolism     Comment:  bilateral No date: HOH (hard of hearing)     Comment:  wears aids No date: Hypercholesterolemia No date: Hypercoagulable state (HCC) No date: Hypertension     Comment:  controlled on meds No date: Hypogonadism in male No date: Infection of skin No date: Iron deficiency anemia No date: Lower urinary tract symptoms No date: Meralgia paresthetica No date: Neck pain No date: Obesity No date: OSA on CPAP No date: Post-void dribbling No date: Prostatitis No date: Pulmonary embolism (HCC)     Comment:  after carpal tunnel surgery No date: SNHL (sensorineural hearing loss) No date: Vertigo     Comment:  occasional  Past Surgical History: No date: Blood vessel Surgery 2011: CARPAL TUNNEL RELEASE; Bilateral 10/17/2017: CATARACT EXTRACTION W/PHACO; Right     Comment:  Procedure: CATARACT EXTRACTION PHACO AND INTRAOCULAR               LENS PLACEMENT (IOC)  RIGHT DIABETIC;  Surgeon:               Lockie Mola, MD;  Location: [facility];  Service: Ophthalmology;  Laterality: Right;  CPAP Diet               controlled diabetic 11/23/2017: CATARACT EXTRACTION W/PHACO; Left     Comment:  Procedure: CATARACT EXTRACTION PHACO AND INTRAOCULAR               LENS PLACEMENT (IOC)  LEFT DIABETIC;  Surgeon:               Lockie Mola, MD;  Location: [facility];              Service: Ophthalmology;  Laterality: Left;  Diabetic                sleep apnea 07/11/2015: COLONOSCOPY WITH PROPOFOL;  N/A     Comment:  Procedure: COLONOSCOPY WITH PROPOFOL;  Surgeon: Christena Deem, MD;  Location: [facility];  Service:               Endoscopy;  Laterality: N/A; 02/02/2019: COLONOSCOPY WITH PROPOFOL; N/A     Comment:  Procedure: COLONOSCOPY WITH PROPOFOL;  Surgeon:               Christena Deem, MD;  Location: [facility] ENDOSCOPY;                Service: Endoscopy;  Laterality: N/A; No date: FOOT SURGERY; Bilateral 2003: FOOT SURGERY; Left     Comment:  complicated by DVT 1984: HEMORRHOID SURGERY 1985: HERNIA REPAIR; Bilateral     Comment:  x2 08/28/2014: HOLEP-LASER ENUCLEATION OF THE PROSTATE WITH MORCELLATION     Comment:  Dr. Vanna Scotland 1985: HYDROCELE EXCISION / REPAIR     Comment:  x2 No date: Hydrocelectomy     Comment:  x 2 No date: INNER EAR SURGERY No date: JOINT REPLACEMENT     Comment:  left knee, right shoulder 1974: LEG SURGERY; Right     Comment:  fatty tumor No date: PROSTATE SURGERY 2012: REPLACEMENT TOTAL KNEE; Left 1968: SHOULDER SURGERY; Right 1958: TONSILLECTOMY 09/18/2014: TOTAL SHOULDER ARTHROPLASTY; Right     Comment:  arthroplasty, glenohumeral joint; total shoulder               (glenoid and prosimal humeral replacement) No date: TUMOR EXCISION; Right     Comment:  fatty tumor 1979: VASECTOMY  BMI    Body Mass Index: 41.56 kg/m      Reproductive/Obstetrics negative OB ROS                             Anesthesia Physical Anesthesia Plan  ASA: 3  Anesthesia Plan: General/Spinal   Post-op Pain Management: Regional block* and Ofirmev IV (intra-op)*   Induction: Intravenous  PONV Risk Score and Plan: 3 and Propofol infusion and TIVA  Airway Management Planned: Natural Airway  Additional Equipment:   Intra-op Plan:   Post-operative Plan:   Informed Consent: I have reviewed the patients History and Physical, chart, labs and discussed the procedure including the risks, benefits and  alternatives for the proposed anesthesia with the patient or authorized representative who has indicated his/her understanding and acceptance.     Dental Advisory Given  Plan Discussed with: Anesthesiologist, CRNA and Surgeon  Anesthesia Plan Comments:         Anesthesia Quick Evaluation

## 2023-07-18 NOTE — Op Note (Signed)
 Patient Name: Lance [name]  AVW:[id number]  Pre-Operative Diagnosis: Left hip Osteoarthritis  Post-Operative Diagnosis: (same)  Procedure: Left Total Hip Arthroplasty  Components/Implants: Cup: Trident Tritanium Clusterhole 52/E w/x2 screws    Liner: Neutral X3 Poly 36/E  Stem: Insignia #4 High offset  Head:Biolox ceramic 36 +32mm   Date of Surgery: 07/18/2023  Surgeon: Reinaldo Berber MD  Assistant: Amador Cunas PA (present and scrubbed throughout the case, critical for assistance with exposure, retraction, instrumentation, and closure), [place] PAs   Anesthesiologist: Laural Benes  Anesthesia: Spinal  EBL: 200cc  IVF:800cc  Complications: None   Brief history: The patient is a 73 year old male with a history of osteoarthritis of the left hip with pain limiting their range of motion and activities of daily living, which has failed multiple attempts at conservative therapy.  The risks and benefits of total hip arthroplasty as definitive surgical treatment were discussed with the patient, who opted to proceed with the operation.  After outpatient medical clearance and optimization was completed the patient was admitted to [facility] for the procedure.  All preoperative films were reviewed and an appropriate surgical plan was made prior to surgery.   Description of procedure: The patient was brought to the operating room where laterality was confirmed by all those present to be the left side.  The patient was administered spinal anesthesia on a stretcher prior to being moved supine on the operating room table. Patient was given an intravenous dose of antibiotics for surgical prophylaxis.  All bony prominences and extremities were well padded and the patient was securely attached to the table boots, a perineal post was placed and the patient had a safety strap placed.  Surgical site was prepped with alcohol and chlorhexidine. The surgical site over the hip was and  draped in typical sterile fashion with multiple layers of adhesive and nonadhesive drapes.  The incision site was marked out with a sterile marker and care was taken to assess the position of the ASIS and ensure appropriate position for the incision.    A surgical timeout was then called with participation of all staff in the room the patient was then a confirmed again and laterality confirmed.  Incision was made over the anterior lateral aspect of the proximal thigh in line with the TFL.  Appropriate retractors were placed and all bleeding vessels were coagulated within the subcutaneous and fatty layers.  An incision was made in the TFL fascia in the interval was carefully identified.  The lateral ascending branches of the circumflex vessels were identified, cauterized and carefully dissected. The main vessels were then tied with a 0 silk hand tie.  Retractors were placed around the superior lateral and inferior medial aspects of the femoral neck and a capsulotomy was performed exposing the hip joint.  Retraction stitches were placed and the capsulotomy to assist with visualization.  Femoral neck cut was then made and the femoral head was extracted after placing the leg in traction.  Bone wax was then applied to the proximal cut surface of the femur and aqua mantis was used to address any bleeding around the femoral neck cut.  Retractors were then placed around the acetabulum to fully visualize the joint space, and the remaining labral tissue was removed and pulvinar was removed.   The acetabulum was then sequentially reamed up to the appropriate size in order to get good fit and fill for the acetabular component while under fluoroscopic guidance.  Acetabular component was then placed and malleted into a  secure fit while confirming position and abduction angle and anteversion utilizing fluoroscopy.  2 screws were then placed in the acetabular cup to assist in securing the cup in place. The cup was irrigated,   a real neutral liner was placed, impacted, and checked for stability. The femur traction was dropped and sequentially externally rotated while performing a release of the posterior and superomedial tissues off of the proximal femur to allow for mobility, care was taken to preserve the external rotators and piriformis attachments.  The remaining interval between the abductors and the capsule was dissected out and a retractor was placed over the superolateral aspect of the femur over the greater trochanter.  The leg was carefully brought down into extension and adducted to provide visualization of the proximal femur for broaching.  The femur was then sequentially broached up to an appropriate size which provided for good fill and stability to the femoral broach.  A trial neck and head were placed on the femoral broach and the leg was brought up for reduction.  The hip was reduced and manual check of stability was performed.  The hip was found to be stable in flexion internal rotation and extension external rotation.  Leg lengths were confirmed on fluoroscopy.   The hip was then dislocated the trial neck and head were removed.  The leg was then brought down into extension and adduction in the proximal femur was reexposed.  The broach trial was removed and the femur was irrigated with normal saline prior to the real femoral stem being implanted.  After the femoral stem was seated and shown to have good fit and fill the appropriate head was impacted the leg was brought up and reduced.  There was good range of motion with stability in flexion internal rotation and extension external rotation on testing.  Leg lengths were found to be appropriate on fluoroscopic evaluation at this time.  The hip was then irrigated with betdine based surgiphor solution and then saline solution.  The capsulotomy was repaired with Ethibond sutures.  A pericapsular and peritrochanteric cocktail with Exparel and bupivacaine was then injected  as well as the subcutaneous tissues. The fascia was closed with a #1 barbed running suture.  The deep tissues were closed with Vicryl sutures the subcutaneous tissues were closed with interrupted Vicryl sutures and a running barbed 4-0 suture.  The skin was then reinforced with Dermabond and a sterile dressing was placed.   The patient was awoken from anesthesia transferred off of the operating room table onto a hospital bed where examination of leg lengths found the leg lengths to be equal with a good distal pulse.  The patient was then transferred to the PACU in stable condition.

## 2023-07-19 ENCOUNTER — Encounter: Payer: Self-pay | Admitting: Orthopedic Surgery

## 2023-07-19 DIAGNOSIS — M1612 Unilateral primary osteoarthritis, left hip: Secondary | ICD-10-CM | POA: Diagnosis not present

## 2023-07-19 LAB — BASIC METABOLIC PANEL
Anion gap: 7 (ref 5–15)
BUN: 16 mg/dL (ref 8–23)
CO2: 27 mmol/L (ref 22–32)
Calcium: 8.6 mg/dL — ABNORMAL LOW (ref 8.9–10.3)
Chloride: 103 mmol/L (ref 98–111)
Creatinine, Ser: 1.11 mg/dL (ref 0.61–1.24)
GFR, Estimated: >60 mL/min (ref >60)
Glucose, Bld: 198 mg/dL — ABNORMAL HIGH (ref 70–99)
Potassium: 4.4 mmol/L (ref 3.5–5.1)
Sodium: 137 mmol/L (ref 135–145)

## 2023-07-19 LAB — CBC
HCT: 36.2 % — ABNORMAL LOW (ref 39.0–52.0)
Hemoglobin: 11.2 g/dL — ABNORMAL LOW (ref 13.0–17.0)
MCH: 24.9 pg — ABNORMAL LOW (ref 26.0–34.0)
MCHC: 30.9 g/dL (ref 30.0–36.0)
MCV: 80.4 fL (ref 80.0–100.0)
Platelets: 237 10*3/uL (ref 150–400)
RBC: 4.5 MIL/uL (ref 4.22–5.81)
RDW: 20.3 % — ABNORMAL HIGH (ref 11.5–15.5)
WBC: 12.6 10*3/uL — ABNORMAL HIGH (ref 4.0–10.5)
nRBC: 0 % (ref 0.0–0.2)

## 2023-07-19 LAB — PROTIME-INR
INR: 1.2 (ref 0.8–1.2)
Prothrombin Time: 15 s (ref 11.4–15.2)

## 2023-07-19 MED ORDER — GABAPENTIN 300 MG PO CAPS
ORAL_CAPSULE | ORAL | Status: AC
  Filled 2023-07-19: qty 1

## 2023-07-19 MED ORDER — LISINOPRIL 5 MG PO TABS
ORAL_TABLET | ORAL | Status: AC
  Filled 2023-07-19: qty 1

## 2023-07-19 MED ORDER — ENOXAPARIN SODIUM 30 MG/0.3ML IJ SOSY: 30.0000 mg | PREFILLED_SYRINGE | Freq: Two times a day (BID) | INTRAMUSCULAR | 0 refills | Status: AC

## 2023-07-19 MED ORDER — OXYCODONE HCL 5 MG PO TABS: 2.5000 mg | ORAL_TABLET | Freq: Four times a day (QID) | ORAL | 0 refills | Status: DC | PRN

## 2023-07-19 MED ORDER — WARFARIN SODIUM 7.5 MG PO TABS
7.5000 mg | ORAL_TABLET | Freq: Once | ORAL | Status: DC
Start: 2023-07-19 — End: 2023-07-19
  Filled 2023-07-19: qty 1

## 2023-07-19 MED ORDER — DOCUSATE SODIUM 100 MG PO CAPS
100.0000 mg | ORAL_CAPSULE | Freq: Two times a day (BID) | ORAL | 0 refills | Status: AC
Start: 2023-07-19 — End: ?

## 2023-07-19 MED ORDER — CELECOXIB 100 MG PO CAPS
100.0000 mg | ORAL_CAPSULE | Freq: Two times a day (BID) | ORAL | 0 refills | Status: AC
Start: 2023-07-19 — End: 2023-07-26

## 2023-07-19 MED ORDER — ONDANSETRON HCL 4 MG PO TABS: 4.0000 mg | ORAL_TABLET | Freq: Four times a day (QID) | ORAL | 0 refills | Status: AC | PRN

## 2023-07-19 MED ORDER — ACETAMINOPHEN 500 MG PO TABS
ORAL_TABLET | ORAL | Status: AC
  Filled 2023-07-19: qty 2

## 2023-07-19 NOTE — Progress Notes (Signed)
 Physical Therapy Treatment Patient Details Name: Lance [name] MRN: [id number] DOB: 1950-12-16 Today's Date: 07/19/2023   History of Present Illness Pt is a 73 yo male s/p L THA. PMH of HTN, DM, GERD, DVT, HOH, PE, TKA.    PT Comments  Pt was supine in bed upon arrival on 1st session this date. He required more assistance to exit bed today than previous date. Pt endorses 5/10 pain. Eager to get OOB to BR. He was able to exit bed, stand to bariatric RW and ambulate to BR. Breakfast tray arrived. Author return for 2nd  session. Pt was able to stand and ambulate and safely perform stairs to simulate home entry. Overall pt is limited by fatigue. Encouraged routine mobility and frequent OOB. Pt has lift chair at home that he can sleep in for first few days and will have a brother in-law +spouse assisting with stairs to enter/exit home. Pt did perform stairs with CGA but was encouraged to have +2 assistance at home for additional safety. Overall pt is progressing well. Author recommends continued skilled PT to maximize his independence and safety with all ADLs.     If plan is discharge home, recommend the following: A little help with walking and/or transfers;A little help with bathing/dressing/bathroom;Assistance with cooking/housework;Assist for transportation;Help with stairs or ramp for entrance     Equipment Recommendations  None recommended by PT (pt has all DME needs met)       Precautions / Restrictions Precautions Precautions: Fall;Anterior Hip Precaution Booklet Issued: Yes (comment) Recall of Precautions/Restrictions: Intact Restrictions Weight Bearing Restrictions Per Provider Order: Yes LLE Weight Bearing Per Provider Order: Weight bearing as tolerated     Mobility  Bed Mobility Overal bed mobility: Needs Assistance Bed Mobility: Supine to Sit  Supine to sit: Mod assist  General bed mobility comments: pt was seated in recliner pre/post session    Transfers Overall  transfer level: Needs assistance Equipment used: Rolling walker (2 wheels) Transfers: Sit to/from Stand Sit to Stand: Contact guard assist, Supervision  General transfer comment: CGA for safety. Vcs for technique improvements    Ambulation/Gait Ambulation/Gait assistance: Contact guard assist, Supervision Gait Distance (Feet): 200 Feet Assistive device: Rolling walker (2 wheels) Gait Pattern/deviations: Step-to pattern, Antalgic  General Gait Details: heavy reliance on BUE on BRW for ambulation   Stairs Stairs: Yes Stairs assistance: Contact guard assist Stair Management: One rail Left, Step to pattern Number of Stairs: 4 General stair comments: Pt was able to ascend/descend 4 stair with +1 UE support. pt has a brother in-law coming to assist him up/down stairs at DC    Balance Overall balance assessment: Needs assistance Sitting-balance support: Feet supported Sitting balance-Leahy Scale: Good     Standing balance support: Bilateral upper extremity supported Standing balance-Leahy Scale: Fair Standing balance comment: reliant on BUE support during all standing activity      Communication Communication Communication: No apparent difficulties  Cognition Arousal: Alert Behavior During Therapy: WFL for tasks assessed/performed   PT - Cognitive impairments: No apparent impairments    Following commands: Intact      Cueing Cueing Techniques: Verbal cues     General Comments General comments (skin integrity, edema, etc.): Pt having alot of drainage out of his incision. RN aware and notified surgeon team      Pertinent Vitals/Pain Pain Assessment Pain Assessment: 0-10 Pain Score: 5  Pain Location: L hip Pain Descriptors / Indicators: Sore Pain Intervention(s): Limited activity within patient's tolerance, Monitored during session, Premedicated before session,  Repositioned, Ice applied     PT Goals (current goals can now be found in the care plan section) Acute Rehab  PT Goals Patient Stated Goal: to go home Progress towards PT goals: Progressing toward goals    Frequency    BID       AM-PAC PT "6 Clicks" Mobility   Outcome Measure  Help needed turning from your back to your side while in a flat bed without using bedrails?: A Little Help needed moving from lying on your back to sitting on the side of a flat bed without using bedrails?: A Lot Help needed moving to and from a bed to a chair (including a wheelchair)?: A Little Help needed standing up from a chair using your arms (e.g., wheelchair or bedside chair)?: A Little Help needed to walk in hospital room?: A Little Help needed climbing 3-5 steps with a railing? : A Little 6 Click Score: 17    End of Session   Activity Tolerance: Patient tolerated treatment well;Patient limited by fatigue;Patient limited by pain Patient left: with call bell/phone within reach;in chair;with family/visitor present Nurse Communication: Mobility status PT Visit Diagnosis: Other abnormalities of gait and mobility (R26.89);Difficulty in walking, not elsewhere classified (R26.2);Muscle weakness (generalized) (M62.81);Pain Pain - Right/Left: Left Pain - part of body: Hip     Time: 1610-9604 PT Time Calculation (min) (ACUTE ONLY): 25 min  Charges:    $Gait Training: 23-37 mins $Therapeutic Activity: 8-22 mins PT General Charges $$ ACUTE PT VISIT: 1 Visit                    Jetta Lout PTA 07/19/23, 10:09 AM

## 2023-07-19 NOTE — Progress Notes (Addendum)
 Subjective: 1 Day Post-Op Procedure(s) (LRB): ARTHROPLASTY, HIP, TOTAL, ANTERIOR APPROACH (Left) Patient reports pain as mild.   Patient is well, and has had no acute complaints or problems Denies any CP, SOB, ABD pain. We will continue therapy today.  Plan is to go Home after hospital stay.  Objective: Vital signs in last 24 hours: Temp:  [97.2 F (36.2 C)-98.6 F (37 C)] 98.1 F (36.7 C) (03/11 0746) Pulse Rate:  [66-85] 76 (03/11 0746) Resp:  [10-20] 20 (03/11 0746) BP: (122-147)/(57-80) 141/59 (03/11 0746) SpO2:  [94 %-100 %] 96 % (03/11 0746)  Intake/Output from previous day: 03/10 0701 - 03/11 0700 In: 2580.3 [P.O.:325; I.V.:2005.3; IV Piggyback:250] Out: 550 [Urine:350; Blood:200] Intake/Output this shift: No intake/output data recorded.  Recent Labs    07/19/23 0550  HGB 11.2*   Recent Labs    07/19/23 0550  WBC 12.6*  RBC 4.50  HCT 36.2*  PLT 237   Recent Labs    07/19/23 0550  NA 137  K 4.4  CL 103  CO2 27  BUN 16  CREATININE 1.11  GLUCOSE 198*  CALCIUM 8.6*   Recent Labs    07/18/23 0625  INR 1.1    EXAM General - Patient is Alert, Appropriate, and Oriented Extremity - Neurovascular intact Sensation intact distally Intact pulses distally Dorsiflexion/Plantar flexion intact No cellulitis present Compartment soft Dressing - moderate drainage,  Motor Function - intact, moving foot and toes well on exam.   Past Medical History:  Diagnosis Date   Acute cystitis    Allergic rhinitis    BPH (benign prostatic hyperplasia)    Bronchitis    chronic   Carpal tunnel syndrome    Complication of anesthesia    delayed emergence after fatty tumor surgery   DDD (degenerative disc disease), lumbar    whole body, joints   Deficiency, protein S (HCC)    Degenerative lumbar spinal stenosis    chronic back pain, left leg and foot   Diabetes mellitus (HCC)    diet controlled   DISH (diffuse idiopathic skeletal hyperostosis)    neck pain    Dyspnea    with exertion, wheezing occasionally   Erectile dysfunction    Factor V Leiden (HCC)    GERD (gastroesophageal reflux disease)    Gout    Headache    migraines-occasionally   History of DVT (deep vein thrombosis)    left leg-after left foot surgery   History of migraine headaches    History of pulmonary embolism    bilateral   HOH (hard of hearing)    wears aids   Hypercholesterolemia    Hypercoagulable state (HCC)    Factor V   Hypertension    controlled on meds   Hypogonadism in male    Infection of skin    Iron deficiency anemia    Lower urinary tract symptoms    Meralgia paresthetica    Neck pain    Obesity    OSA on CPAP    Post-void dribbling    Prostatitis    Pulmonary embolism (HCC)    after carpal tunnel surgery   SNHL (sensorineural hearing loss)    Vertigo    occasional    Assessment/Plan:   1 Day Post-Op Procedure(s) (LRB): ARTHROPLASTY, HIP, TOTAL, ANTERIOR APPROACH (Left) Principal Problem:   S/P total left hip arthroplasty  Estimated body mass index is 40.42 kg/m as calculated from the following:   Height as of this encounter: 6' (1.829 m).  Weight as of this encounter: 135.2 kg. Advance diet Up with therapy Pain controlled Labs and VSS Dressing change this am CM to assist with discharge to home with HHPT  DVT Prophylaxis - Lovenox, TED hose, and SCDs Weight-Bearing as tolerated to left leg   T. Cranston Neighbor, PA-C [facility] 07/19/2023, 8:01 AM

## 2023-07-19 NOTE — Progress Notes (Signed)
 Pt given discharge instructions. Patient and family verbalized understanding of instructions. IV removed. RN and CNA took patient to car via wheelchair and assisted patient in getting in the car.

## 2023-07-19 NOTE — Consult Note (Signed)
 PHARMACY - ANTICOAGULATION CONSULT NOTE  Pharmacy Consult for warfarin Indication: Hx of PE/DVT  Allergies  Allergen Reactions   Penicillins     Other reaction(s):unknown,as a child    Patient Measurements: Height: 6' (182.9 cm) Weight: 135.2 kg (298 lb) IBW/kg (Calculated) : 77.6  Vital Signs: Temp: 97.7 F (36.5 C) (03/11 1106) Temp Source: Temporal (03/11 1106) BP: 141/59 (03/11 0746) Pulse Rate: 79 (03/11 1106)  Labs: Recent Labs    07/18/23 0625 07/19/23 0550 07/19/23 0813  HGB  --  11.2*  --   HCT  --  36.2*  --   PLT  --  237  --   LABPROT 14.3  --  15.0  INR 1.1  --  1.2  CREATININE  --  1.11  --     Estimated Creatinine Clearance: 84.3 mL/min (by C-G formula based on SCr of 1.11 mg/dL).   Medical History: Past Medical History:  Diagnosis Date   Acute cystitis    Allergic rhinitis    BPH (benign prostatic hyperplasia)    Bronchitis    chronic   Carpal tunnel syndrome    Complication of anesthesia    delayed emergence after fatty tumor surgery   DDD (degenerative disc disease), lumbar    whole body, joints   Deficiency, protein S (HCC)    Degenerative lumbar spinal stenosis    chronic back pain, left leg and foot   Diabetes mellitus (HCC)    diet controlled   DISH (diffuse idiopathic skeletal hyperostosis)    neck pain   Dyspnea    with exertion, wheezing occasionally   Erectile dysfunction    Factor V Leiden (HCC)    GERD (gastroesophageal reflux disease)    Gout    Headache    migraines-occasionally   History of DVT (deep vein thrombosis)    left leg-after left foot surgery   History of migraine headaches    History of pulmonary embolism    bilateral   HOH (hard of hearing)    wears aids   Hypercholesterolemia    Hypercoagulable state (HCC)    Factor V   Hypertension    controlled on meds   Hypogonadism in male    Infection of skin    Iron deficiency anemia    Lower urinary tract symptoms    Meralgia paresthetica    Neck  pain    Obesity    OSA on CPAP    Post-void dribbling    Prostatitis    Pulmonary embolism (HCC)    after carpal tunnel surgery   SNHL (sensorineural hearing loss)    Vertigo    occasional    Medications:  Medications Prior to Admission  Medication Sig Dispense Refill Last Dose/Taking   Cholecalciferol (VITAMIN D-3) 125 MCG (5000 UT) TABS Take 5,000 Units by mouth daily.   Past Week   cyanocobalamin (VITAMIN B12) 1000 MCG tablet Take 1,000 mcg by mouth daily.   Past Week   DULoxetine (CYMBALTA) 60 MG capsule Take 60 mg by mouth every morning.   07/18/2023 at  4:00 AM   fluticasone (FLONASE) 50 MCG/ACT nasal spray Place 2 sprays into both nostrils daily as needed for allergies.   Past Month   gabapentin (NEURONTIN) 300 MG capsule Take 300 mg by mouth 4 (four) times daily.    07/18/2023 at  4:00 AM   HYDROcodone-acetaminophen (NORCO/VICODIN) 5-325 MG tablet Take 1 tablet by mouth every 6 (six) hours. 6 tablet 0 07/18/2023 at  4:00 AM  lisinopril (ZESTRIL) 5 MG tablet Take 5 mg by mouth daily.   07/18/2023 at  4:00 AM   metFORMIN (GLUCOPHAGE-XR) 500 MG 24 hr tablet Take 1,000 mg by mouth every evening.   Past Week   pantoprazole (PROTONIX) 40 MG tablet Take 40 mg by mouth 2 (two) times daily.   07/18/2023 at  4:00 AM   pravastatin (PRAVACHOL) 20 MG tablet Take 20 mg by mouth at bedtime.   07/17/2023   propranolol (INDERAL) 10 MG tablet Take 10 mg by mouth 4 (four) times daily as needed (Takes for Tremors).   07/18/2023 at  4:00 AM   tadalafil (CIALIS) 20 MG tablet Take 1 tablet (20 mg total) by mouth daily as needed for erectile dysfunction. 30 tablet 3 Past Week   testosterone cypionate (DEPOTESTOSTERONE CYPIONATE) 200 MG/ML injection Inject 1 mL (200 mg total) into the muscle every 14 (fourteen) days. 10 mL 0 07/15/2023   warfarin (COUMADIN) 5 MG tablet Take 2.5-5 mg by mouth daily. 5 mg M, W, F and 2.5 mg on T, Th, Sat and Sun   07/12/2023   cetirizine (ZYRTEC) 10 MG tablet Take 10 mg by mouth  daily as needed for allergies. (Patient not taking: Reported on 07/18/2023)   Not Taking   cyclobenzaprine (FLEXERIL) 5 MG tablet Take 5 mg by mouth 3 (three) times daily as needed for muscle spasms. (Patient not taking: Reported on 07/07/2023)   Not Taking   NEEDLE, DISP, 18 G (BD DISP NEEDLES) 18G X 1-1/2" MISC 1 mg by Does not apply route every 14 (fourteen) days. 50 each 0    NEEDLE, DISP, 21 G (BD DISP NEEDLES) 21G X 1-1/2" MISC 1 mg by Does not apply route every 14 (fourteen) days. 50 each 0    Syringe, Disposable, (2-3CC SYRINGE) 3 ML MISC 1 mg by Does not apply route every 14 (fourteen) days. 25 each 3    tamsulosin (FLOMAX) 0.4 MG CAPS capsule Take 0.4 mg by mouth daily. (Patient not taking: Reported on 07/14/2023)   Not Taking   Scheduled:   acetaminophen  1,000 mg Oral Q8H   docusate sodium  100 mg Oral BID   DULoxetine  60 mg Oral BH-q7a   enoxaparin (LOVENOX) injection  30 mg Subcutaneous Q12H   gabapentin  300 mg Oral QID   lisinopril  5 mg Oral Daily   pantoprazole  40 mg Oral Daily   pravastatin  20 mg Oral Daily   Warfarin - Pharmacist Dosing Inpatient   Does not apply q1600   Infusions:   sodium chloride Stopped (07/18/23 1646)   PRN: acetaminophen, HYDROcodone-acetaminophen, menthol-cetylpyridinium **OR** phenol, metoCLOPramide **OR** metoCLOPramide (REGLAN) injection, morphine injection, ondansetron **OR** ondansetron (ZOFRAN) IV, propranolol, traMADol Anti-infectives (From admission, onward)    Start     Dose/Rate Route Frequency Ordered Stop   07/18/23 1400  ceFAZolin (ANCEF) IVPB 3g/150 mL premix        3 g 300 mL/hr over 30 Minutes Intravenous Every 6 hours 07/18/23 1345 07/18/23 2119   07/18/23 0615  ceFAZolin (ANCEF) IVPB 3g/150 mL premix        3 g 300 mL/hr over 30 Minutes Intravenous On call to O.R. 07/18/23 6433 07/18/23 0755       Assessment: 73 y.o. male presents for history and physical for left direct anterior total hip arthroplasty with Dr. Audelia Acton  on 07/18/2023. Home dose: warfarin 5 mg MWF and 2.5 mg TThSaSu. Consult said pt has hx of APLS, but I do not see  any PMH of it in previous notes. Baseline INR 1.1. s/p Arthroplasty Total hip 3/10. DDI cefazolin with end date (increase INR), duloxetine (increase bleeding), enoxaparin (increase bleeding).   Date INR Warfarin Dose 3/10 1.1 7.5 mg 3/11 1.2 7.5 mg  Goal of Therapy:  INR 2-3 Monitor platelets by anticoagulation protocol: Yes   Plan:  INR remains subtherapeutic; CBC stable Give warfarin 7.5 mg again this evening Continue enoxaparin 30 mg SQ BID until INR > 2 Monitor INR daily while admitted Monitor CBC at least weekly  Thank you for involving pharmacy in this patient's care.   Rockwell Alexandria, PharmD Clinical Pharmacist 07/19/2023 12:39 PM

## 2023-07-19 NOTE — Discharge Summary (Addendum)
 Physician Discharge Summary  Patient ID: Lance [name] MRN: [id number] DOB/AGE: 74-22-1952 74 y.o.  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) 05/16/2019   Orthostasis 05/16/2019   Gout 12/28/2017   History of migraine headaches 12/28/2017   High risk medication use 04/22/2017   Primary osteoarthritis of right knee 04/11/2017   Testosterone deficiency 01/18/2017   BMI 40.0-44.9, adult (HCC) 10/28/2016   Chronic use of opiate drug for therapeutic purpose 05/06/2016   Erectile dysfunction of organic origin 07/24/2015   History of adenomatous polyp of colon 07/11/2015   BPH with obstruction/lower urinary tract symptoms 04/10/2015   Hypogonadism in male 01/07/2015   Hammer toe of right foot 12/24/2014   Venous insufficiency 12/24/2014   H/O total knee replacement, left 12/12/2014   Deficiency, protein S (HCC) 09/19/2014   S/P shoulder replacement, right 09/18/2014   Degenerative lumbar spinal stenosis 06/26/2014   Lumbar radiculitis 02/08/2014   Allergic rhinitis 10/16/2013   DDD (degenerative disc disease), lumbar 10/16/2013   Diabetes mellitus (HCC) 10/16/2013   Hypercholesterolemia 10/16/2013   Iron deficiency anemia due to chronic blood loss 10/16/2013   Long term current use of anticoagulant therapy 10/16/2013   Venous stasis dermatitis of both lower extremities 10/16/2013    Type 2 diabetes mellitus with other specified complication (HCC) 10/16/2013   Arthritis of knee 08/18/2011   Low back pain 07/05/2011   Primary localized osteoarthrosis, lower leg 08/26/2010   Essential hypertension 12/02/2009   Gastro-esophageal reflux disease without esophagitis 12/02/2009   Obstructive sleep apnea 12/02/2009   Shortness of breath 12/02/2009   Pulmonary embolism and infarction (HCC) 12/02/2009    Past Medical History:  Diagnosis Date   Acute cystitis    Allergic rhinitis    BPH (benign prostatic hyperplasia)    Bronchitis    chronic   Carpal tunnel syndrome    Complication of anesthesia    delayed emergence after fatty tumor surgery   DDD (degenerative disc disease), lumbar    whole body, joints   Deficiency, protein S (HCC)    Degenerative lumbar spinal stenosis    chronic back pain, left leg and foot   Diabetes mellitus (HCC)    diet controlled   DISH (diffuse idiopathic skeletal hyperostosis)    neck pain   Dyspnea    with exertion, wheezing occasionally   Erectile dysfunction    Factor V Leiden (HCC)    GERD (gastroesophageal reflux disease)    Gout    Headache    migraines-occasionally   History of DVT (deep vein thrombosis)    left leg-after left foot surgery   History of migraine headaches    History of pulmonary embolism    bilateral   HOH (hard of hearing)    wears aids   Hypercholesterolemia    Hypercoagulable state (HCC)    Factor V   Hypertension    controlled on meds   Hypogonadism in male    Infection  of skin    Iron deficiency anemia    Lower urinary tract symptoms    Meralgia paresthetica    Neck pain    Obesity    OSA on CPAP    Post-void dribbling    Prostatitis    Pulmonary embolism (HCC)    after carpal tunnel surgery   SNHL (sensorineural hearing loss)    Vertigo    occasional     Transfusion: none   Consultants (if any):   Discharged Condition: Improved  Hospital Course: Lance [name] is an 73  y.o. male who was admitted 07/18/2023 with a diagnosis of S/P total left hip arthroplasty and went to the operating room on 07/18/2023 and underwent the above named procedures.    Surgeries: Procedure(s): ARTHROPLASTY, HIP, TOTAL, ANTERIOR APPROACH on 07/18/2023 Patient tolerated the surgery well. Taken to PACU where she was stabilized and then transferred to the orthopedic floor.  Started on Coumadin and Lovenox 30 mg q 12 hrs. TEDs and SCDs applied bilaterally. Heels elevated on bed. No evidence of DVT. Negative Homan. Physical therapy started on day #1 for gait training and transfer. OT started day #1 for ADL and assisted devices.  Patient's IV was d/c on day #1. Patient was able to safely and independently complete all PT goals. PT recommending discharge to home.    On post op day #1 patient was stable and ready for discharge to home with HHPT.  Implants: Cup: Trident Tritanium Clusterhole 52/E w/x2 screws    Liner: Neutral X3 Poly 36/E  Stem: Insignia #4 High offset  Head:Biolox ceramic 36 +94mm     He was given perioperative antibiotics:  Anti-infectives (From admission, onward)    Start     Dose/Rate Route Frequency Ordered Stop   07/18/23 1400  ceFAZolin (ANCEF) IVPB 3g/150 mL premix        3 g 300 mL/hr over 30 Minutes Intravenous Every 6 hours 07/18/23 1345 07/18/23 2119   07/18/23 0615  ceFAZolin (ANCEF) IVPB 3g/150 mL premix        3 g 300 mL/hr over 30 Minutes Intravenous On call to O.R. 07/18/23 [id number] 07/18/23 0755     .  He was given sequential compression devices, early ambulation, and Lovenox Coumadin TEDs for DVT prophylaxis.  He benefited maximally from the hospital stay and there were no complications.    Recent vital signs:  Vitals:   07/19/23 0627 07/19/23 0746  BP: 136/60 (!) 141/59  Pulse: 76 76  Resp: 20 20  Temp: (!) 97.2 F (36.2 C) 98.1 F (36.7 C)  SpO2: 96% 96%    Recent laboratory studies:  Lab Results  Component Value Date   HGB 11.2 (L)  07/19/2023   HGB 13.0 07/07/2023   HGB 13.4 03/15/2023   Lab Results  Component Value Date   WBC 12.6 (H) 07/19/2023   PLT 237 07/19/2023   Lab Results  Component Value Date   INR 1.1 07/18/2023   Lab Results  Component Value Date   NA 137 07/19/2023   K 4.4 07/19/2023   CL 103 07/19/2023   CO2 27 07/19/2023   BUN 16 07/19/2023   CREATININE 1.11 07/19/2023   GLUCOSE 198 (H) 07/19/2023    Discharge Medications:   Allergies as of 07/19/2023       Reactions   Penicillins    Other reaction(s):unknown,as a child        Medication List     STOP taking these medications    cetirizine 10  MG tablet Commonly known as: ZYRTEC   cyclobenzaprine 5 MG tablet Commonly known as: FLEXERIL   tamsulosin 0.4 MG Caps capsule Commonly known as: FLOMAX       TAKE these medications    2-3CC SYRINGE 3 ML Misc 1 mg by Does not apply route every 14 (fourteen) days.   BD Disp Needles 18G X 1-1/2" Misc Generic drug: NEEDLE (DISP) 18 G 1 mg by Does not apply route every 14 (fourteen) days.   BD Disp Needles 21G X 1-1/2" Misc Generic drug: NEEDLE (DISP) 21 G 1 mg by Does not apply route every 14 (fourteen) days.   celecoxib 100 MG capsule Commonly known as: CeleBREX Take 1 capsule (100 mg total) by mouth 2 (two) times daily for 7 days.   chlorhexidine 4 % external liquid Commonly known as: HIBICLENS Apply 15 mLs (1 Application total) topically as directed for 30 doses. Use as directed daily for 5 days every other week for 6 weeks.   cyanocobalamin 1000 MCG tablet Commonly known as: VITAMIN B12 Take 1,000 mcg by mouth daily.   docusate sodium 100 MG capsule Commonly known as: COLACE Take 1 capsule (100 mg total) by mouth 2 (two) times daily.   DULoxetine 60 MG capsule Commonly known as: CYMBALTA Take 60 mg by mouth every morning.   enoxaparin 30 MG/0.3ML injection Commonly known as: LOVENOX Inject 0.3 mLs (30 mg total) into the skin every 12 (twelve) hours. DC  Lovenox once INR therapeutic   fluticasone 50 MCG/ACT nasal spray Commonly known as: FLONASE Place 2 sprays into both nostrils daily as needed for allergies.   gabapentin 300 MG capsule Commonly known as: NEURONTIN Take 300 mg by mouth 4 (four) times daily.   HYDROcodone-acetaminophen 5-325 MG tablet Commonly known as: NORCO/VICODIN Take 1 tablet by mouth every 6 (six) hours.   lisinopril 5 MG tablet Commonly known as: ZESTRIL Take 5 mg by mouth daily.   metFORMIN 500 MG 24 hr tablet Commonly known as: GLUCOPHAGE-XR Take 1,000 mg by mouth every evening.   mupirocin ointment 2 % Commonly known as: BACTROBAN Place 1 Application into the nose 2 (two) times daily for 60 doses. Use as directed 2 times daily for 5 days every other week for 6 weeks.   ondansetron 4 MG tablet Commonly known as: ZOFRAN Take 1 tablet (4 mg total) by mouth every 6 (six) hours as needed for nausea.   oxyCODONE 5 MG immediate release tablet Commonly known as: Roxicodone Take 0.5-1 tablets (2.5-5 mg total) by mouth every 6 (six) hours as needed for breakthrough pain.   pantoprazole 40 MG tablet Commonly known as: PROTONIX Take 40 mg by mouth 2 (two) times daily.   pravastatin 20 MG tablet Commonly known as: PRAVACHOL Take 20 mg by mouth at bedtime.   propranolol 10 MG tablet Commonly known as: INDERAL Take 10 mg by mouth 4 (four) times daily as needed (Takes for Tremors).   tadalafil 20 MG tablet Commonly known as: CIALIS Take 1 tablet (20 mg total) by mouth daily as needed for erectile dysfunction.   testosterone cypionate 200 MG/ML injection Commonly known as: DEPOTESTOSTERONE CYPIONATE Inject 1 mL (200 mg total) into the muscle every 14 (fourteen) days.   Vitamin D-3 125 MCG (5000 UT) Tabs Take 5,000 Units by mouth daily.   warfarin 5 MG tablet Commonly known as: COUMADIN Take 2.5-5 mg by mouth daily. 5 mg M, W, F and 2.5 mg on T, Th, Sat and Sun  Diagnostic Studies: DG HIP  UNILAT WITH PELVIS 2-3 VIEWS LEFT Result Date: 07/18/2023 CLINICAL DATA:  Elective surgery. EXAM: DG HIP (WITH OR WITHOUT PELVIS) 2-3V LEFT COMPARISON:  None Available. FINDINGS: Three fluoroscopic spot views of the pelvis and left hip obtained in the operating room. Images during hip arthroplasty. Fluoroscopy time 40.1 seconds. Dose 13.96 mGy. IMPRESSION: Intraoperative fluoroscopy during left hip arthroplasty. Electronically Signed   By: Narda Rutherford M.D.   On: 07/18/2023 10:06   DG C-Arm 1-60 Min-No Report Result Date: 07/18/2023 Fluoroscopy was utilized by the requesting physician.  No radiographic interpretation.   DG C-Arm 1-60 Min-No Report Result Date: 07/18/2023 Fluoroscopy was utilized by the requesting physician.  No radiographic interpretation.   PERIPHERAL VASCULAR CATHETERIZATION Result Date: 07/14/2023 See surgical note for result.   Disposition:  There are no questions and answers to display.           Follow-up Information     Evon Slack, PA-C Follow up in 2 week(s).   Specialties: Orthopedic Surgery, Emergency Medicine Contact information: [address] [place] [address] [phone]                  Signed: Evon Slack 07/19/2023, 8:15 AM

## 2023-07-19 NOTE — Discharge Instructions (Signed)
 Instructions after Anterior Total Hip Replacement        Dr. Regenia Skeeter., M.D.      Dept. of Orthopaedics & Sports Medicine  [facility]  [address]  [place], [place]  [address]  Phone: [phone]   Fax: [phone]    DIET: Drink plenty of non-alcoholic fluids. Resume your normal diet. Include foods high in fiber.  ACTIVITY:  You may use crutches or a walker with weight-bearing as tolerated, unless instructed otherwise. You may be weaned off of the walker or crutches by your Physical Therapist.  Continue doing gentle exercises. Exercising will reduce the pain and swelling, increase motion, and prevent muscle weakness.   Please continue to use the TED compression stockings for 2 weeks. You may remove the stockings at night, but should reapply them in the morning. Do not drive or operate any equipment until instructed.  WOUND CARE:  Continue to use ice packs periodically to reduce pain and swelling. You may shower with honeycomb dressing 3 days after your surgery. Do not submerge incision site under water. Remove honeycomb dressing 7 days after surgery and allow dermabond to fall off on its own.   MEDICATIONS: You may resume your regular medications. Please take the pain medication as prescribed on the medication list. Do not take pain medication on an empty stomach. Continue with Coumadin and Lovenox.  Once her INR is therapeutic, please discontinue Lovenox. Pain medications and iron supplements can cause constipation. Use a stool softener (Senokot or Colace) on a daily basis and a laxative (dulcolax or miralax) as needed. Do not drive or drink alcoholic beverages when taking pain medications.  POSTOPERATIVE CONSTIPATION PROTOCOL Constipation - defined medically as fewer than three stools per week and severe constipation as less than one stool per week.  One of the most common issues patients have following surgery is constipation.  Even if you have a  regular bowel pattern at home, your normal regimen is likely to be disrupted due to multiple reasons following surgery.  Combination of anesthesia, postoperative narcotics, change in appetite and fluid intake all can affect your bowels.  In order to avoid complications following surgery, here are some recommendations in order to help you during your recovery period.  Colace (docusate) - Pick up an over-the-counter form of Colace or another stool softener and take twice a day as long as you are requiring postoperative pain medications.  Take with a full glass of water daily.  If you experience loose stools or diarrhea, hold the colace until you stool forms back up.  If your symptoms do not get better within 1 week or if they get worse, check with your doctor.  Dulcolax (bisacodyl) - Pick up over-the-counter and take as directed by the product packaging as needed to assist with the movement of your bowels.  Take with a full glass of water.  Use this product as needed if not relieved by Colace only.   MiraLax (polyethylene glycol) - Pick up over-the-counter to have on hand.  MiraLax is a solution that will increase the amount of water in your bowels to assist with bowel movements.  Take as directed and can mix with a glass of water, juice, soda, coffee, or tea.  Take if you go more than two days without a movement. Do not use MiraLax more than once per day. Call your doctor if you are still constipated or irregular after using this medication for 7 days in a row.  If you continue to  have problems with postoperative constipation, please contact the office for further assistance and recommendations.  If you experience "the worst abdominal pain ever" or develop nausea or vomiting, please contact the office immediatly for further recommendations for treatment.   CALL THE OFFICE FOR: Temperature above 101 degrees Excessive bleeding or drainage on the dressing. Excessive swelling, coldness, or paleness of the  toes. Persistent nausea and vomiting.  FOLLOW-UP:  You should have an appointment to return to the office in 2 weeks after surgery. Arrangements have been made for continuation of Physical Therapy (either home therapy or outpatient therapy).

## 2023-07-19 NOTE — Anesthesia Postprocedure Evaluation (Signed)
 Anesthesia Post Note  Patient: Lance [name]  Procedure(s) Performed: ARTHROPLASTY, HIP, TOTAL, ANTERIOR APPROACH (Left: Hip)  Patient location during evaluation: Nursing Unit Anesthesia Type: Spinal Level of consciousness: awake and alert and oriented Pain management: pain level controlled Vital Signs Assessment: post-procedure vital signs reviewed and stable Respiratory status: respiratory function stable Cardiovascular status: stable Postop Assessment: no headache, no backache, patient able to bend at knees, no apparent nausea or vomiting, able to ambulate and adequate PO intake Anesthetic complications: no   No notable events documented.   Last Vitals:  Vitals:   07/19/23 0627 07/19/23 0746  BP: 136/60 (!) 141/59  Pulse: 76 76  Resp: 20 20  Temp: (!) 36.2 C 36.7 C  SpO2: 96% 96%    Last Pain:  Vitals:   07/19/23 0627  TempSrc:   PainSc: 3                  Zachary George

## 2023-07-19 NOTE — Plan of Care (Signed)
 Patient ready for discharge.

## 2023-07-25 ENCOUNTER — Other Ambulatory Visit: Payer: Self-pay

## 2023-07-25 DIAGNOSIS — E291 Testicular hypofunction: Secondary | ICD-10-CM

## 2023-07-29 ENCOUNTER — Other Ambulatory Visit: Payer: Medicare Other

## 2023-07-29 DIAGNOSIS — E291 Testicular hypofunction: Secondary | ICD-10-CM

## 2023-07-30 LAB — HEMOGLOBIN AND HEMATOCRIT, BLOOD
Hematocrit: 41.9 % (ref 37.5–51.0)
Hemoglobin: 12.4 g/dL — ABNORMAL LOW (ref 13.0–17.7)

## 2023-07-30 LAB — TESTOSTERONE: Testosterone: 672 ng/dL (ref 264–916)

## 2023-08-04 ENCOUNTER — Ambulatory Visit: Payer: Medicare Other | Admitting: Physician Assistant

## 2023-08-22 ENCOUNTER — Other Ambulatory Visit (INDEPENDENT_AMBULATORY_CARE_PROVIDER_SITE_OTHER): Payer: Self-pay | Admitting: Vascular Surgery

## 2023-08-22 DIAGNOSIS — Z86718 Personal history of other venous thrombosis and embolism: Secondary | ICD-10-CM

## 2023-08-22 DIAGNOSIS — Z95828 Presence of other vascular implants and grafts: Secondary | ICD-10-CM

## 2023-08-25 ENCOUNTER — Ambulatory Visit (INDEPENDENT_AMBULATORY_CARE_PROVIDER_SITE_OTHER)

## 2023-08-25 ENCOUNTER — Ambulatory Visit (INDEPENDENT_AMBULATORY_CARE_PROVIDER_SITE_OTHER): Payer: PRIVATE HEALTH INSURANCE | Admitting: Nurse Practitioner

## 2023-08-25 DIAGNOSIS — Z86718 Personal history of other venous thrombosis and embolism: Secondary | ICD-10-CM | POA: Diagnosis not present

## 2023-08-25 DIAGNOSIS — Z95828 Presence of other vascular implants and grafts: Secondary | ICD-10-CM

## 2023-08-29 ENCOUNTER — Ambulatory Visit (INDEPENDENT_AMBULATORY_CARE_PROVIDER_SITE_OTHER): Admitting: Nurse Practitioner

## 2023-08-29 ENCOUNTER — Encounter (INDEPENDENT_AMBULATORY_CARE_PROVIDER_SITE_OTHER): Payer: Self-pay | Admitting: Nurse Practitioner

## 2023-08-29 VITALS — BP 138/74 | HR 95 | Resp 18 | Wt 301.5 lb

## 2023-08-29 DIAGNOSIS — R0989 Other specified symptoms and signs involving the circulatory and respiratory systems: Secondary | ICD-10-CM

## 2023-08-29 DIAGNOSIS — Z86718 Personal history of other venous thrombosis and embolism: Secondary | ICD-10-CM

## 2023-08-29 DIAGNOSIS — E1169 Type 2 diabetes mellitus with other specified complication: Secondary | ICD-10-CM | POA: Diagnosis not present

## 2023-08-29 DIAGNOSIS — Z95828 Presence of other vascular implants and grafts: Secondary | ICD-10-CM | POA: Diagnosis not present

## 2023-08-29 NOTE — Progress Notes (Signed)
 Subjective:    Patient ID: Lance [name], male    DOB: Feb 06, 1951, 73 y.o.   MRN: [id number] Chief Complaint  Patient presents with   Follow-up    [facility] 6 week with dvt    The patient presents to the office for follow-up status post IVC filter placement prior to joint replacement surgery.  The patient has a history of DVT and therefore was at high risk for recurrence in the perioperative timeframe after undergoing a total joint replacement.    Left hip arthroplasty surgery was on 07/18/2023.  The patient has been doing well with rehab and their mobility is increasing.  He notes that he is also due for a right hip replacement as well.  This may be coming in the next several months.  The patient notes the affected leg is not increasingly swollen or painful.  No signs and symptoms consistent with recurrence of DVT in the lower extremity.  Patient does note that as a baseline the affected extremity tends to swell with prolonged dependence, symptoms are much better with elevation.  The patient notes minimal edema in the morning which steadily worsens throughout the day.  The patient has not been using compression therapy at this point.  The patient is on anticoagulation.  No SOB or pleuritic chest pains.  No cough or hemoptysis.  No blood per rectum or blood in any sputum.  No excessive bruising per the patient.   No recent shortening of the patient's walking distance or new symptoms consistent with claudication.  No history of rest pain symptoms. No new ulcers or wounds of the lower extremities have occurred.  The patient denies amaurosis fugax or recent TIA symptoms. There are no recent neurological changes noted. No recent episodes of angina or shortness of breath documented.     Review of Systems  Musculoskeletal:  Positive for arthralgias.  All other systems reviewed and are negative.      Objective:   Physical Exam Vitals reviewed.  HENT:     Head: Normocephalic.   Neck:     Vascular: Carotid bruit present.  Cardiovascular:     Rate and Rhythm: Normal rate.  Pulmonary:     Effort: Pulmonary effort is normal.  Skin:    General: Skin is warm and dry.  Neurological:     Mental Status: He is alert and oriented to person, place, and time.  Psychiatric:        Mood and Affect: Mood normal.        Behavior: Behavior normal.        Thought Content: Thought content normal.        Judgment: Judgment normal.     BP 138/74   Pulse 95   Resp 18   Wt (!) 301 lb 8 oz (136.8 kg)   BMI 40.89 kg/m   Past Medical History:  Diagnosis Date   Acute cystitis    Allergic rhinitis    BPH (benign prostatic hyperplasia)    Bronchitis    chronic   Carpal tunnel syndrome    Complication of anesthesia    delayed emergence after fatty tumor surgery   DDD (degenerative disc disease), lumbar    whole body, joints   Deficiency, protein S (HCC)    Degenerative lumbar spinal stenosis    chronic back pain, left leg and foot   Diabetes mellitus (HCC)    diet controlled   DISH (diffuse idiopathic skeletal hyperostosis)    neck pain   Dyspnea  with exertion, wheezing occasionally   Erectile dysfunction    Factor V Leiden (HCC)    GERD (gastroesophageal reflux disease)    Gout    Headache    migraines-occasionally   History of DVT (deep vein thrombosis)    left leg-after left foot surgery   History of migraine headaches    History of pulmonary embolism    bilateral   HOH (hard of hearing)    wears aids   Hypercholesterolemia    Hypercoagulable state (HCC)    Factor V   Hypertension    controlled on meds   Hypogonadism in male    Infection of skin    Iron deficiency anemia    Lower urinary tract symptoms    Meralgia paresthetica    Neck pain    Obesity    OSA on CPAP    Post-void dribbling    Prostatitis    Pulmonary embolism (HCC)    after carpal tunnel surgery   SNHL (sensorineural hearing loss)    Vertigo    occasional    Social  History   Socioeconomic History   Marital status: Married    Spouse name: Not on file   Number of children: Not on file   Years of education: Not on file   Highest education level: Not on file  Occupational History   Not on file  Tobacco Use   Smoking status: Never   Smokeless tobacco: Never  Vaping Use   Vaping status: Never Used  Substance and Sexual Activity   Alcohol use: No    Alcohol/week: 0.0 standard drinks of alcohol   Drug use: Never   Sexual activity: Not on file  Other Topics Concern   Not on file  Social History Narrative   Not on file   Social Drivers of Health   Financial Resource Strain: Low Risk  (07/11/2023)   Received from [facility]   Overall Financial Resource Strain (CARDIA)    Difficulty of Paying Living Expenses: Not hard at all  Food Insecurity: No Food Insecurity (07/18/2023)   Hunger Vital Sign    Worried About Running Out of Food in the Last Year: Never true    Ran Out of Food in the Last Year: Never true  Transportation Needs: No Transportation Needs (07/18/2023)   PRAPARE - Administrator, Civil Service (Medical): No    Lack of Transportation (Non-Medical): No  Physical Activity: Not on file  Stress: Not on file  Social Connections: Socially Integrated (07/18/2023)   Social Connection and Isolation Panel [NHANES]    Frequency of Communication with Friends and Family: Three times a week    Frequency of Social Gatherings with Friends and Family: Three times a week    Attends Religious Services: More than 4 times per year    Active Member of Clubs or Organizations: Yes    Attends Banker Meetings: 1 to 4 times per year    Marital Status: Married  Catering manager Violence: Not At Risk (07/18/2023)   Humiliation, Afraid, Rape, and Kick questionnaire    Fear of Current or Ex-Partner: No    Emotionally Abused: No    Physically Abused: No    Sexually Abused: No    Past Surgical History:  Procedure  Laterality Date   Blood vessel Surgery     CARPAL TUNNEL RELEASE Bilateral 2011   CATARACT EXTRACTION W/PHACO Right 10/17/2017   Procedure: CATARACT EXTRACTION PHACO AND INTRAOCULAR LENS PLACEMENT (IOC)  RIGHT DIABETIC;  Surgeon: Annell Kidney, MD;  Location: [facility];  Service: Ophthalmology;  Laterality: Right;  CPAP Diet controlled diabetic   CATARACT EXTRACTION W/PHACO Left 11/23/2017   Procedure: CATARACT EXTRACTION PHACO AND INTRAOCULAR LENS PLACEMENT (IOC)  LEFT DIABETIC;  Surgeon: Annell Kidney, MD;  Location: [facility];  Service: Ophthalmology;  Laterality: Left;  Diabetic  sleep apnea   COLONOSCOPY WITH PROPOFOL  N/A 07/11/2015   Procedure: COLONOSCOPY WITH PROPOFOL ;  Surgeon: Deveron Fly, MD;  Location: [facility];  Service: Endoscopy;  Laterality: N/A;   COLONOSCOPY WITH PROPOFOL  N/A 02/02/2019   Procedure: COLONOSCOPY WITH PROPOFOL ;  Surgeon: Deveron Fly, MD;  Location: [facility] END[facility]COPY;  Service: Endoscopy;  Laterality: N/A;   FOOT SURGERY Bilateral    FOOT SURGERY Left 2003   complicated by DVT   HEMORRHOID SURGERY  1984   HERNIA REPAIR Bilateral 1985   x2   HOLEP-LASER ENUCLEATION OF THE PROSTATE WITH MORCELLATION  08/28/2014   Dr. Dustin Gimenez   HYDROCELE EXCISION / REPAIR  1985   x2   Hydrocelectomy     x 2   INNER EAR SURGERY     INSERTION OF MESH Right 10/01/2022   Procedure: INSERTION OF MESH;  Surgeon: Conrado Delay, DO;  Location: [facility];  Service: General;  Laterality: Right;   IVC FILTER INSERTION N/A 07/14/2023   Procedure: IVC FILTER INSERTION;  Surgeon: Celso College, MD;  Location: [facility] IN[facility];  Service: Cardiovascular;  Laterality: N/A;   JOINT REPLACEMENT     left knee, right shoulder   LEG SURGERY Right 1974   fatty tumor   PROSTATE SURGERY     REPLACEMENT TOTAL KNEE Left 2012   SHOULDER SURGERY Right 1968   TONSILLECTOMY  1958   TOTAL HIP ARTHROPLASTY Left 07/18/2023   Procedure:  ARTHROPLASTY, HIP, TOTAL, ANTERIOR APPROACH;  Surgeon: Venus Ginsberg, MD;  Location: [facility];  Service: Orthopedics;  Laterality: Left;   TOTAL SHOULDER ARTHROPLASTY Right 09/18/2014   arthroplasty, glenohumeral joint; total shoulder (glenoid and prosimal humeral replacement)   TUMOR EXCISION Right    fatty tumor   VASECTOMY  1979    Family History  Problem Relation Age of Onset   Thrombosis Other    Skin cancer Father    Kidney disease Neg Hx    Prostate cancer Neg Hx    Kidney cancer Neg Hx    Bladder Cancer Neg Hx     Allergies  Allergen Reactions   Penicillins     Other reaction(s):unknown,as a child       Latest Ref Rng & Units 07/29/2023    8:44 AM 07/19/2023    5:50 AM 07/07/2023    3:20 PM  CBC  WBC 4.0 - 10.5 K/uL  12.6  7.0   Hemoglobin 13.0 - 17.7 g/dL 40.9  81.1  91.4   Hematocrit 37.5 - 51.0 % 41.9  36.2  42.4   Platelets 150 - 400 K/uL  237  244       CMP     Component Value Date/Time   NA 137 07/19/2023 0550   NA 136 08/19/2014 1320   K 4.4 07/19/2023 0550   K 3.8 08/19/2014 1320   CL 103 07/19/2023 0550   CL 103 08/19/2014 1320   CO2 27 07/19/2023 0550   CO2 28 08/19/2014 1320   GLUCOSE 198 (H) 07/19/2023 0550   GLUCOSE 92 08/19/2014 1320   BUN 16 07/19/2023 0550   BUN 14 08/19/2014 1320  CREATININE 1.11 07/19/2023 0550   CREATININE 0.89 08/19/2014 1320   CALCIUM 8.6 (L) 07/19/2023 0550   CALCIUM 9.1 08/19/2014 1320   PROT 6.6 07/07/2023 1520   PROT 6.9 04/10/2015 0826   ALBUMIN 3.1 (L) 07/07/2023 1520   ALBUMIN 3.6 04/10/2015 0826   AST 21 07/07/2023 1520   ALT 20 07/07/2023 1520   ALKPHOS 61 07/07/2023 1520   BILITOT 0.4 07/07/2023 1520   BILITOT 0.4 04/10/2015 0826   GFRNONAA >60 07/19/2023 0550   GFRNONAA >60 08/19/2014 1320     No results found.     Assessment & Plan:   1. History of deep vein thrombosis (DVT) of lower extremity (Primary) No evidence of new DVT noted today.  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  No additional intervention necessary.  Continue with current anticoagulation.  Anticoagulation will continue to be directed by PCP. 2. Bruit of right carotid artery Patient had a very faint bruit.  In light of the patient's family member having a recent stroke we will have him undergo a carotid duplex to rule out any worsening disease.  3. Type 2 diabetes mellitus with other specified complication, unspecified whether long term insulin use (HCC) Continue hypoglycemic medications as already ordered, these medications have been reviewed and there are no changes at this time.  Hgb A1C to be monitored as already arranged by primary service  4. S/P IVC filter The patient notes that he will hopefully have his right hip replaced soon.  He will be meeting with his surgeon this week to discuss the feasibility of his right hip replacement.  I have discussed with the patient that if this is going to be done in the next few months it is reasonable to leave his IVC filter in place and removed following his surgery.   Current Outpatient Medications on File Prior to Visit  Medication Sig Dispense Refill   chlorhexidine  (HIBICLENS ) 4 % external liquid Apply 15 mLs (1 Application total) topically as directed for 30 doses. Use as directed daily for 5 days every other week for 6 weeks. 946 mL 1   Cholecalciferol (VITAMIN D-3) 125 MCG (5000 UT) TABS Take 5,000 Units by mouth daily.     cyanocobalamin (VITAMIN B12) 1000 MCG tablet Take 1,000 mcg by mouth daily.     docusate sodium  (COLACE) 100 MG capsule Take 1 capsule (100 mg total) by mouth 2 (two) times daily. 10 capsule 0   DULoxetine  (CYMBALTA ) 60 MG capsule Take 60 mg by mouth every morning.     enoxaparin  (LOVENOX ) 30 MG/0.3ML injection Inject 0.3 mLs (30 mg total) into the skin every 12 (twelve) hours. DC Lovenox  once INR therapeutic 1.8 mL 0   fluticasone (FLONASE) 50 MCG/ACT nasal spray Place 2 sprays into both  nostrils daily as needed for allergies.     gabapentin  (NEURONTIN ) 300 MG capsule Take 300 mg by mouth 4 (four) times daily.      HYDROcodone -acetaminophen  (NORCO/VICODIN) 5-325 MG tablet Take 1 tablet by mouth every 6 (six) hours. 6 tablet 0   lisinopril  (ZESTRIL ) 5 MG tablet Take 5 mg by mouth daily.     metFORMIN (GLUCOPHAGE-XR) 500 MG 24 hr tablet Take 1,000 mg by mouth every evening.     NEEDLE, DISP, 18 G (BD DISP NEEDLES) 18G X 1-1/2" MISC 1 mg by Does not apply route every 14 (fourteen) days. 50 each 0   NEEDLE, DISP, 21 G (BD DISP NEEDLES) 21G  X 1-1/2" MISC 1 mg by Does not apply route every 14 (fourteen) days. 50 each 0   ondansetron  (ZOFRAN ) 4 MG tablet Take 1 tablet (4 mg total) by mouth every 6 (six) hours as needed for nausea. 20 tablet 0   pantoprazole  (PROTONIX ) 40 MG tablet Take 40 mg by mouth 2 (two) times daily.     pravastatin  (PRAVACHOL ) 20 MG tablet Take 20 mg by mouth at bedtime.     propranolol  (INDERAL ) 10 MG tablet Take 10 mg by mouth 4 (four) times daily as needed (Takes for Tremors).     Syringe, Disposable, (2-3CC SYRINGE) 3 ML MISC 1 mg by Does not apply route every 14 (fourteen) days. 25 each 3   tadalafil  (CIALIS ) 20 MG tablet Take 1 tablet (20 mg total) by mouth daily as needed for erectile dysfunction. 30 tablet 3   testosterone  cypionate (DEPOTESTOSTERONE CYPIONATE) 200 MG/ML injection Inject 1 mL (200 mg total) into the muscle every 14 (fourteen) days. 10 mL 0   warfarin (COUMADIN ) 5 MG tablet Take 2.5-5 mg by mouth daily. 5 mg M, W, F and 2.5 mg on T, Th, Sat and Sun     oxyCODONE  (ROXICODONE ) 5 MG immediate release tablet Take 0.5-1 tablets (2.5-5 mg total) by mouth every 6 (six) hours as needed for breakthrough pain. (Patient not taking: Reported on 08/29/2023) 30 tablet 0   No current facility-administered medications on file prior to visit.    There are no Patient Instructions on file for this visit. No follow-ups on file.   Tasheka Houseman E Raphaela Cannaday, NP

## 2023-09-27 ENCOUNTER — Encounter (INDEPENDENT_AMBULATORY_CARE_PROVIDER_SITE_OTHER): Payer: Self-pay

## 2023-09-27 ENCOUNTER — Other Ambulatory Visit (INDEPENDENT_AMBULATORY_CARE_PROVIDER_SITE_OTHER): Payer: Self-pay | Admitting: Nurse Practitioner

## 2023-09-27 DIAGNOSIS — R0989 Other specified symptoms and signs involving the circulatory and respiratory systems: Secondary | ICD-10-CM

## 2023-09-29 ENCOUNTER — Ambulatory Visit (INDEPENDENT_AMBULATORY_CARE_PROVIDER_SITE_OTHER)

## 2023-09-29 ENCOUNTER — Ambulatory Visit (INDEPENDENT_AMBULATORY_CARE_PROVIDER_SITE_OTHER): Payer: PRIVATE HEALTH INSURANCE | Admitting: Nurse Practitioner

## 2023-09-29 ENCOUNTER — Encounter (INDEPENDENT_AMBULATORY_CARE_PROVIDER_SITE_OTHER): Payer: Self-pay | Admitting: Nurse Practitioner

## 2023-09-29 VITALS — BP 157/80 | HR 71 | Resp 18 | Ht 72.0 in | Wt 293.6 lb

## 2023-09-29 DIAGNOSIS — I10 Essential (primary) hypertension: Secondary | ICD-10-CM | POA: Diagnosis not present

## 2023-09-29 DIAGNOSIS — R0989 Other specified symptoms and signs involving the circulatory and respiratory systems: Secondary | ICD-10-CM | POA: Diagnosis not present

## 2023-09-29 DIAGNOSIS — Z95828 Presence of other vascular implants and grafts: Secondary | ICD-10-CM | POA: Diagnosis not present

## 2023-10-03 ENCOUNTER — Encounter (INDEPENDENT_AMBULATORY_CARE_PROVIDER_SITE_OTHER): Payer: Self-pay | Admitting: Nurse Practitioner

## 2023-10-03 NOTE — Progress Notes (Signed)
 Subjective:    Patient ID: Lance [name], male    DOB: 1950-06-26, 73 y.o.   MRN: [id number] Chief Complaint  Patient presents with   Follow-up    Pt conv carotid follow up    The patient returns today for follow-up of noninvasive studies including carotid duplex after her demonstration of a right carotid bruit.  The patient notes that he recently met with his orthopedic surgeon and he will not need to have a right hip replacement but he does note that he may need to have his knee replaced done shortly.  He denies any worsening of his swelling other than his lymphedema that has been ongoing.  He continues to utilize conservative therapy including elevation and activity as tolerable.    Review of Systems  Cardiovascular:  Positive for leg swelling.  All other systems reviewed and are negative.      Objective:    Physical Exam Vitals reviewed.  HENT:     Head: Normocephalic.  Cardiovascular:     Rate and Rhythm: Normal rate.     Pulses: Normal pulses.  Pulmonary:     Effort: Pulmonary effort is normal.  Musculoskeletal:     Right lower leg: Edema present.     Left lower leg: Edema present.  Skin:    General: Skin is warm and dry.  Neurological:     Mental Status: He is alert and oriented to person, place, and time.  Psychiatric:        Mood and Affect: Mood normal.        Behavior: Behavior normal.        Thought Content: Thought content normal.        Judgment: Judgment normal.     BP (!) 157/80   Pulse 71   Resp 18   Ht 6' (1.829 m)   Wt 293 lb 9.6 oz (133.2 kg)   BMI 39.82 kg/m   Past Medical History:  Diagnosis Date   Acute cystitis    Allergic rhinitis    BPH (benign prostatic hyperplasia)    Bronchitis    chronic   Carpal tunnel syndrome    Complication of anesthesia    delayed emergence after fatty tumor surgery   DDD (degenerative disc disease), lumbar    whole body, joints   Deficiency, protein S (HCC)    Degenerative lumbar spinal  stenosis    chronic back pain, left leg and foot   Diabetes mellitus (HCC)    diet controlled   DISH (diffuse idiopathic skeletal hyperostosis)    neck pain   Dyspnea    with exertion, wheezing occasionally   Erectile dysfunction    Factor V Leiden (HCC)    GERD (gastroesophageal reflux disease)    Gout    Headache    migraines-occasionally   History of DVT (deep vein thrombosis)    left leg-after left foot surgery   History of migraine headaches    History of pulmonary embolism    bilateral   HOH (hard of hearing)    wears aids   Hypercholesterolemia    Hypercoagulable state (HCC)    Factor V   Hypertension    controlled on meds   Hypogonadism in male    Infection of skin    Iron deficiency anemia    Lower urinary tract symptoms    Meralgia paresthetica    Neck pain    Obesity    OSA on CPAP    Post-void dribbling  Prostatitis    Pulmonary embolism (HCC)    after carpal tunnel surgery   SNHL (sensorineural hearing loss)    Vertigo    occasional    Social History   Socioeconomic History   Marital status: Married    Spouse name: Not on file   Number of children: Not on file   Years of education: Not on file   Highest education level: Not on file  Occupational History   Not on file  Tobacco Use   Smoking status: Never   Smokeless tobacco: Never  Vaping Use   Vaping status: Never Used  Substance and Sexual Activity   Alcohol use: No    Alcohol/week: 0.0 standard drinks of alcohol   Drug use: Never   Sexual activity: Not on file  Other Topics Concern   Not on file  Social History Narrative   Not on file   Social Drivers of Health   Financial Resource Strain: Low Risk  (07/11/2023)   [facility] from [facility]   Overall Financial Resource Strain (CARDIA)    Difficulty of Paying Living Expenses: Not hard at all  Food Insecurity: No Food Insecurity (07/18/2023)   Hunger Vital Sign    Worried About Running Out of Food in the Last  Year: Never true    Ran Out of Food in the Last Year: Never true  Transportation Needs: No Transportation Needs (07/18/2023)   PRAPARE - Administrator, Civil Service (Medical): No    Lack of Transportation (Non-Medical): No  Physical Activity: Not on file  Stress: Not on file  Social Connections: Socially Integrated (07/18/2023)   Social Connection and Isolation Panel [NHANES]    Frequency of Communication with Friends and Family: Three times a week    Frequency of Social Gatherings with Friends and Family: Three times a week    Attends Religious Services: More than 4 times per year    Active Member of Clubs or Organizations: Yes    Attends Banker Meetings: 1 to 4 times per year    Marital Status: Married  Catering manager Violence: Not At Risk (07/18/2023)   Humiliation, Afraid, Rape, and Kick questionnaire    Fear of Current or Ex-Partner: No    Emotionally Abused: No    Physically Abused: No    Sexually Abused: No    Past Surgical History:  Procedure Laterality Date   Blood vessel Surgery     CARPAL TUNNEL RELEASE Bilateral 2011   CATARACT EXTRACTION W/PHACO Right 10/17/2017   Procedure: CATARACT EXTRACTION PHACO AND INTRAOCULAR LENS PLACEMENT (IOC)  RIGHT DIABETIC;  Surgeon: Annell Kidney, MD;  Location: [facility];  Service: Ophthalmology;  Laterality: Right;  CPAP Diet controlled diabetic   CATARACT EXTRACTION W/PHACO Left 11/23/2017   Procedure: CATARACT EXTRACTION PHACO AND INTRAOCULAR LENS PLACEMENT (IOC)  LEFT DIABETIC;  Surgeon: Annell Kidney, MD;  Location: [facility];  Service: Ophthalmology;  Laterality: Left;  Diabetic  sleep apnea   COLONOSCOPY WITH PROPOFOL  N/A 07/11/2015   Procedure: COLONOSCOPY WITH PROPOFOL ;  Surgeon: Deveron Fly, MD;  Location: [facility];  Service: Endoscopy;  Laterality: N/A;   COLONOSCOPY WITH PROPOFOL  N/A 02/02/2019   Procedure: COLONOSCOPY WITH PROPOFOL ;  Surgeon: Deveron Fly, MD;  Location: [facility] ENDOSCOPY;  Service: Endoscopy;  Laterality: N/A;   FOOT SURGERY Bilateral    FOOT SURGERY Left 2003   complicated by DVT   HEMORRHOID SURGERY  1984   HERNIA REPAIR Bilateral 1985  x2   HOLEP-LASER ENUCLEATION OF THE PROSTATE WITH MORCELLATION  08/28/2014   Dr. Dustin Gimenez   HYDROCELE EXCISION / REPAIR  1985   x2   Hydrocelectomy     x 2   INNER EAR SURGERY     INSERTION OF MESH Right 10/01/2022   Procedure: INSERTION OF MESH;  Surgeon: Conrado Delay, DO;  Location: [facility] ORS;  Service: General;  Laterality: Right;   IVC FILTER INSERTION N/A 07/14/2023   Procedure: IVC FILTER INSERTION;  Surgeon: Celso College, MD;  Location: [facility] INVASIVE [facility];  Service: Cardiovascular;  Laterality: N/A;   JOINT REPLACEMENT     left knee, right shoulder   LEG SURGERY Right 1974   fatty tumor   PROSTATE SURGERY     REPLACEMENT TOTAL KNEE Left 2012   SHOULDER SURGERY Right 1968   TONSILLECTOMY  1958   TOTAL HIP ARTHROPLASTY Left 07/18/2023   Procedure: ARTHROPLASTY, HIP, TOTAL, ANTERIOR APPROACH;  Surgeon: Venus Ginsberg, MD;  Location: [facility];  Service: Orthopedics;  Laterality: Left;   TOTAL SHOULDER ARTHROPLASTY Right 09/18/2014   arthroplasty, glenohumeral joint; total shoulder (glenoid and prosimal humeral replacement)   TUMOR EXCISION Right    fatty tumor   VASECTOMY  1979    Family History  Problem Relation Age of Onset   Thrombosis Other    Skin cancer Father    Kidney disease Neg Hx    Prostate cancer Neg Hx    Kidney cancer Neg Hx    Bladder Cancer Neg Hx     Allergies  Allergen Reactions   Penicillins     Other reaction(s):unknown,as a child       Latest Ref Rng & Units 07/29/2023    8:44 AM 07/19/2023    5:50 AM 07/07/2023    3:20 PM  CBC  WBC 4.0 - 10.5 K/uL  12.6  7.0   Hemoglobin 13.0 - 17.7 g/dL 32.4  40.1  02.7   Hematocrit 37.5 - 51.0 % 41.9  36.2  42.4   Platelets 150 - 400 K/uL  237  244        CMP     Component Value  Date/Time   NA 137 07/19/2023 0550   NA 136 08/19/2014 1320   K 4.4 07/19/2023 0550   K 3.8 08/19/2014 1320   CL 103 07/19/2023 0550   CL 103 08/19/2014 1320   CO2 27 07/19/2023 0550   CO2 28 08/19/2014 1320   GLUCOSE 198 (H) 07/19/2023 0550   GLUCOSE 92 08/19/2014 1320   BUN 16 07/19/2023 0550   BUN 14 08/19/2014 1320   CREATININE 1.11 07/19/2023 0550   CREATININE 0.89 08/19/2014 1320   CALCIUM 8.6 (L) 07/19/2023 0550   CALCIUM 9.1 08/19/2014 1320   PROT 6.6 07/07/2023 1520   PROT 6.9 04/10/2015 0826   ALBUMIN 3.1 (L) 07/07/2023 1520   ALBUMIN 3.6 04/10/2015 0826   AST 21 07/07/2023 1520   ALT 20 07/07/2023 1520   ALKPHOS 61 07/07/2023 1520   BILITOT 0.4 07/07/2023 1520   BILITOT 0.4 04/10/2015 0826   GFRNONAA >60 07/19/2023 0550   GFRNONAA >60 08/19/2014 1320     No results found.     Assessment & Plan:   1. Bruit of right carotid artery (Primary) Today carotid artery duplex is normal with no evidence of significant disease.  This can be followed up on an as-needed basis.  2. S/P IVC filter He notes that at this time he does not need his right  hip replaced but he notes that he actually may need his knee replaced.  He is going to be meeting with his orthopedic surgeon soon.  I discussed with the patient that once he determines if he is going to have surgery within the next month or 2 leaving his IVC filter in place is reasonable.  However if this is going to go longer such as maybe a year or more than it would be reasonable to remove his filter given his history of factor V Leiden.  The patient is understanding.  He will contact us  once he is spoken with his orthopedic surgeon in the next week or 2.  3. Essential hypertension Continue antihypertensive medications as already ordered, these medications have been reviewed and there are no changes at this time.   Current Outpatient Medications on File Prior to Visit  Medication Sig Dispense Refill   chlorhexidine   (HIBICLENS ) 4 % external liquid Apply 15 mLs (1 Application total) topically as directed for 30 doses. Use as directed daily for 5 days every other week for 6 weeks. 946 mL 1   Cholecalciferol (VITAMIN D-3) 125 MCG (5000 UT) TABS Take 5,000 Units by mouth daily.     cyanocobalamin (VITAMIN B12) 1000 MCG tablet Take 1,000 mcg by mouth daily.     docusate sodium  (COLACE) 100 MG capsule Take 1 capsule (100 mg total) by mouth 2 (two) times daily. 10 capsule 0   DULoxetine  (CYMBALTA ) 60 MG capsule Take 60 mg by mouth every morning.     enoxaparin  (LOVENOX ) 30 MG/0.3ML injection Inject 0.3 mLs (30 mg total) into the skin every 12 (twelve) hours. DC Lovenox  once INR therapeutic 1.8 mL 0   fluticasone (FLONASE) 50 MCG/ACT nasal spray Place 2 sprays into both nostrils daily as needed for allergies.     gabapentin  (NEURONTIN ) 300 MG capsule Take 300 mg by mouth 4 (four) times daily.      HYDROcodone -acetaminophen  (NORCO/VICODIN) 5-325 MG tablet Take 1 tablet by mouth every 6 (six) hours. 6 tablet 0   lisinopril  (ZESTRIL ) 5 MG tablet Take 5 mg by mouth daily.     metFORMIN (GLUCOPHAGE-XR) 500 MG 24 hr tablet Take 1,000 mg by mouth every evening.     NEEDLE, DISP, 18 G (BD DISP NEEDLES) 18G X 1-1/2" MISC 1 mg by Does not apply route every 14 (fourteen) days. 50 each 0   NEEDLE, DISP, 21 G (BD DISP NEEDLES) 21G X 1-1/2" MISC 1 mg by Does not apply route every 14 (fourteen) days. 50 each 0   ondansetron  (ZOFRAN ) 4 MG tablet Take 1 tablet (4 mg total) by mouth every 6 (six) hours as needed for nausea. 20 tablet 0   pantoprazole  (PROTONIX ) 40 MG tablet Take 40 mg by mouth 2 (two) times daily.     pravastatin  (PRAVACHOL ) 20 MG tablet Take 20 mg by mouth at bedtime.     propranolol  (INDERAL ) 10 MG tablet Take 10 mg by mouth 4 (four) times daily as needed (Takes for Tremors).     Syringe, Disposable, (2-3CC SYRINGE) 3 ML MISC 1 mg by Does not apply route every 14 (fourteen) days. 25 each 3   tadalafil  (CIALIS ) 20 MG  tablet Take 1 tablet (20 mg total) by mouth daily as needed for erectile dysfunction. 30 tablet 3   testosterone  cypionate (DEPOTESTOSTERONE CYPIONATE) 200 MG/ML injection Inject 1 mL (200 mg total) into the muscle every 14 (fourteen) days. 10 mL 0   warfarin (COUMADIN ) 5 MG tablet Take 2.5-5 mg  by mouth daily. 5 mg M, W, F and 2.5 mg on T, Th, Sat and Sun     oxyCODONE  (ROXICODONE ) 5 MG immediate release tablet Take 0.5-1 tablets (2.5-5 mg total) by mouth every 6 (six) hours as needed for breakthrough pain. (Patient not taking: Reported on 09/29/2023) 30 tablet 0   No current facility-administered medications on file prior to visit.    There are no Patient Instructions on file for this visit. No follow-ups on file.   Lateka Rady E Addalie Calles, NP

## 2023-11-09 ENCOUNTER — Emergency Department
Admission: EM | Admit: 2023-11-09 | Discharge: 2023-11-09 | Disposition: A | Discharge status: Home / Self Care | Source: Ambulatory Visit | Attending: Emergency Medicine | Admitting: Emergency Medicine

## 2023-11-09 ENCOUNTER — Other Ambulatory Visit: Payer: Self-pay

## 2023-11-09 ENCOUNTER — Ambulatory Visit (INDEPENDENT_AMBULATORY_CARE_PROVIDER_SITE_OTHER): Admitting: Urology

## 2023-11-09 ENCOUNTER — Encounter: Payer: Self-pay | Admitting: Urology

## 2023-11-09 ENCOUNTER — Encounter: Payer: Self-pay | Admitting: Emergency Medicine

## 2023-11-09 VITALS — BP 152/118 | HR 56 | Temp 98.2°F | Ht 72.0 in | Wt 292.0 lb

## 2023-11-09 DIAGNOSIS — T485X5A Adverse effect of other anti-common-cold drugs, initial encounter: Secondary | ICD-10-CM | POA: Insufficient documentation

## 2023-11-09 DIAGNOSIS — N529 Male erectile dysfunction, unspecified: Secondary | ICD-10-CM

## 2023-11-09 DIAGNOSIS — I10 Essential (primary) hypertension: Secondary | ICD-10-CM | POA: Diagnosis not present

## 2023-11-09 DIAGNOSIS — E291 Testicular hypofunction: Secondary | ICD-10-CM

## 2023-11-09 DIAGNOSIS — R42 Dizziness and giddiness: Secondary | ICD-10-CM | POA: Diagnosis present

## 2023-11-09 DIAGNOSIS — N138 Other obstructive and reflux uropathy: Secondary | ICD-10-CM

## 2023-11-09 DIAGNOSIS — T50901A Poisoning by unspecified drugs, medicaments and biological substances, accidental (unintentional), initial encounter: Secondary | ICD-10-CM

## 2023-11-09 DIAGNOSIS — T444X5A Adverse effect of predominantly alpha-adrenoreceptor agonists, initial encounter: Secondary | ICD-10-CM

## 2023-11-09 DIAGNOSIS — N401 Enlarged prostate with lower urinary tract symptoms: Secondary | ICD-10-CM

## 2023-11-09 LAB — CBC WITH DIFFERENTIAL/PLATELET
Abs Immature Granulocytes: 0.04 10*3/uL (ref 0.00–0.07)
Basophils Absolute: 0 10*3/uL (ref 0.0–0.1)
Basophils Relative: 0 %
Eosinophils Absolute: 0.1 10*3/uL (ref 0.0–0.5)
Eosinophils Relative: 1 %
HCT: 38.4 % — ABNORMAL LOW (ref 39.0–52.0)
Hemoglobin: 11.3 g/dL — ABNORMAL LOW (ref 13.0–17.0)
Immature Granulocytes: 0 %
Lymphocytes Relative: 14 %
Lymphs Abs: 1.3 10*3/uL (ref 0.7–4.0)
MCH: 21.8 pg — ABNORMAL LOW (ref 26.0–34.0)
MCHC: 29.4 g/dL — ABNORMAL LOW (ref 30.0–36.0)
MCV: 74.1 fL — ABNORMAL LOW (ref 80.0–100.0)
Monocytes Absolute: 0.6 10*3/uL (ref 0.1–1.0)
Monocytes Relative: 7 %
Neutro Abs: 6.9 10*3/uL (ref 1.7–7.7)
Neutrophils Relative %: 78 %
Platelets: 267 10*3/uL (ref 150–400)
RBC: 5.18 MIL/uL (ref 4.22–5.81)
RDW: 22.5 % — ABNORMAL HIGH (ref 11.5–15.5)
Smear Review: NORMAL
WBC: 9 10*3/uL (ref 4.0–10.5)
nRBC: 0 % (ref 0.0–0.2)

## 2023-11-09 LAB — BASIC METABOLIC PANEL WITH GFR
Anion gap: 6 (ref 5–15)
BUN: 16 mg/dL (ref 8–23)
CO2: 28 mmol/L (ref 22–32)
Calcium: 9.1 mg/dL (ref 8.9–10.3)
Chloride: 105 mmol/L (ref 98–111)
Creatinine, Ser: 1.07 mg/dL (ref 0.61–1.24)
GFR, Estimated: >60 mL/min (ref >60)
Glucose, Bld: 118 mg/dL — ABNORMAL HIGH (ref 70–99)
Potassium: 4.5 mmol/L (ref 3.5–5.1)
Sodium: 139 mmol/L (ref 135–145)

## 2023-11-09 MED ORDER — PHENYLEPHRINE 100 MCG/ML FOR PRIAPISM (OUTPATIENT ~~LOC~~ UROLOGY USE ONLY)
100.0000 ug | Freq: Once | INTRAMUSCULAR | Status: AC
  Administered 2023-11-09: 100 ug via INTRACAVERNOUS

## 2023-11-09 MED ORDER — SODIUM CHLORIDE 0.9 % IV BOLUS
1000.0000 mL | Freq: Once | INTRAVENOUS | Status: AC
  Administered 2023-11-09: 1000 mL via INTRAVENOUS

## 2023-11-09 MED ORDER — TADALAFIL 20 MG PO TABS
20.0000 mg | ORAL_TABLET | Freq: Every day | ORAL | 3 refills | Status: AC | PRN
Start: 2023-11-09 — End: ?

## 2023-11-09 NOTE — Discharge Instructions (Signed)
 Follow-up with your primary care provider.  Return to the ER immediately for new, worsening, or persistent severe weakness or lightheadedness, feel like you are going to pass out, low blood pressure readings, palpitations, chest pain, or any other new or worsening symptoms that concern you.  Check your blood pressure at least once or twice during the rest of the day today as well as tomorrow to make sure that it is in the normal range.

## 2023-11-09 NOTE — Progress Notes (Signed)
 11/09/23 3:23 PM   Lance [name] Jun 01, 1950 [id number]  Referring provider:  Steva Clotilda DEL, NP [address] [place],  [place] [address]  Urological history  1.  Hypogonadism -testosterone  level (07/2023) 672 -HCT/hemoglobin (07/2023) 12.4/41.9 -failed Testopel   -AVEED  75 mg every 10 weeks   2. BPH with LU TS -PSA pending  -s/p HoLEP 2016 -pathology of prostate chips negative   3. ED -contributing factors of age, BPH, testosterone  deficiency, DM, HTN, HLD, spinal injury, COPD, anticoagulation therapy and sleep apnea (sleeps with CPAP) -tadalafil  20 mg daily    5. Bilateral renal cysts - contrast CT 01/2021 - Bilateral renal cysts measuring up to 4 cm in the left mid kidney - stable on CT renal stone study 09/2021   6. Bladder herniation -CT renal stone study 09/2021 - right inguinal hernia containing fat and small portion of the right anterior lateral bladder wall, unchanged when compared to prior CT in 2022  Chief Complaint  Patient presents with   Hypogonadism    Pt had Psa done today     HPI: Lance [name] is a 73 y.o.male who presents today to for an AVEED  injection.    Previous records reviewed.   PSA is drawn today.    He received the injection around 10:00 this morning.  As per protocol, he was waiting in the room for 30 minutes after the AVEED  injection as required per package insert to monitor for POMES.    A few minutes after the injection, he started to sweat profusely and get chills.  He stated that the injection felt different from the previous Aveed  injections he had received.  He states after the injection was administered he actually felt his prostate tightened up and had to urinate.  His blood pressure also spiked to 198/111.  He stayed with us  for one hour and 15 minutes while we continue to monitor his blood pressure until it reached his baseline.  He stated he felt back to normal and we started to send him home from the  office.  When I went to chart his note today, I noticed that he had been charged for phenylephrine .  I then confirmed with my staff exactly what medication was injected and it was indeed the phenylephrine  100 mg / 10 mL and the whole 10 mL vial was given.  I then spoke with our inpatient pharmacy staff and after some research and further discussion, we decided to send him to the emergency department for further evaluation and monitoring.    PMH: Past Medical History:  Diagnosis Date   Acute cystitis    Allergic rhinitis    BPH (benign prostatic hyperplasia)    Bronchitis    chronic   Carpal tunnel syndrome    Complication of anesthesia    delayed emergence after fatty tumor surgery   DDD (degenerative disc disease), lumbar    whole body, joints   Deficiency, protein S (HCC)    Degenerative lumbar spinal stenosis    chronic back pain, left leg and foot   Diabetes mellitus (HCC)    diet controlled   DISH (diffuse idiopathic skeletal hyperostosis)    neck pain   Dyspnea    with exertion, wheezing occasionally   Erectile dysfunction    Factor V Leiden (HCC)    GERD (gastroesophageal reflux disease)    Gout    Headache    migraines-occasionally   History of DVT (deep vein thrombosis)    left leg-after left foot  surgery   History of migraine headaches    History of pulmonary embolism    bilateral   HOH (hard of hearing)    wears aids   Hypercholesterolemia    Hypercoagulable state (HCC)    Factor V   Hypertension    controlled on meds   Hypogonadism in male    Infection of skin    Iron deficiency anemia    Lower urinary tract symptoms    Meralgia paresthetica    Neck pain    Obesity    OSA on CPAP    Post-void dribbling    Prostatitis    Pulmonary embolism (HCC)    after carpal tunnel surgery   SNHL (sensorineural hearing loss)    Vertigo    occasional    Surgical History: Past Surgical History:  Procedure Laterality Date   Blood vessel Surgery     CARPAL  TUNNEL RELEASE Bilateral 2011   CATARACT EXTRACTION W/PHACO Right 10/17/2017   Procedure: CATARACT EXTRACTION PHACO AND INTRAOCULAR LENS PLACEMENT (IOC)  RIGHT DIABETIC;  Surgeon: Mittie Gaskin, MD;  Location: [facility];  Service: Ophthalmology;  Laterality: Right;  CPAP Diet controlled diabetic   CATARACT EXTRACTION W/PHACO Left 11/23/2017   Procedure: CATARACT EXTRACTION PHACO AND INTRAOCULAR LENS PLACEMENT (IOC)  LEFT DIABETIC;  Surgeon: Mittie Gaskin, MD;  Location: [facility];  Service: Ophthalmology;  Laterality: Left;  Diabetic  sleep apnea   COLONOSCOPY WITH PROPOFOL  N/A 07/11/2015   Procedure: COLONOSCOPY WITH PROPOFOL ;  Surgeon: Gladis RAYMOND Mariner, MD;  Location: [facility] END[facility];  Service: Endoscopy;  Laterality: N/A;   COLONOSCOPY WITH PROPOFOL  N/A 02/02/2019   Procedure: COLONOSCOPY WITH PROPOFOL ;  Surgeon: Mariner Gladis RAYMOND, MD;  Location: [facility] ENDOSCOPY;  Service: Endoscopy;  Laterality: N/A;   FOOT SURGERY Bilateral    FOOT SURGERY Left 2003   complicated by DVT   HEMORRHOID SURGERY  1984   HERNIA REPAIR Bilateral 1985   x2   HOLEP-LASER ENUCLEATION OF THE PROSTATE WITH MORCELLATION  08/28/2014   Dr. Rosina Riis   HYDROCELE EXCISION / REPAIR  1985   x2   Hydrocelectomy     x 2   INNER EAR SURGERY     INSERTION OF MESH Right 10/01/2022   Procedure: INSERTION OF MESH;  Surgeon: Tye Millet, DO;  Location: [facility] ORS;  Service: General;  Laterality: Right;   IVC FILTER INSERTION N/A 07/14/2023   Procedure: IVC FILTER INSERTION;  Surgeon: Marea Selinda RAMAN, MD;  Location: [facility] INVASIVE CV LAB;  Service: Cardiovascular;  Laterality: N/A;   JOINT REPLACEMENT     left knee, right shoulder   LEG SURGERY Right 1974   fatty tumor   PROSTATE SURGERY     REPLACEMENT TOTAL KNEE Left 2012   SHOULDER SURGERY Right 1968   TONSILLECTOMY  1958   TOTAL HIP ARTHROPLASTY Left 07/18/2023   Procedure: ARTHROPLASTY, HIP, TOTAL, ANTERIOR APPROACH;  Surgeon:  Lorelle Hussar, MD;  Location: [facility] ORS;  Service: Orthopedics;  Laterality: Left;   TOTAL SHOULDER ARTHROPLASTY Right 09/18/2014   arthroplasty, glenohumeral joint; total shoulder (glenoid and prosimal humeral replacement)   TUMOR EXCISION Right    fatty tumor   VASECTOMY  1979    Home Medications:  Allergies as of 11/09/2023       Reactions   Penicillins    Other reaction(s):unknown,as a child        Medication List        Accurate as of November 09, 2023  3:23 PM. If you  have any questions, ask your nurse or doctor.          2-3CC SYRINGE 3 ML Misc 1 mg by Does not apply route every 14 (fourteen) days.   BD Disp Needles 18G X 1-1/2 Misc Generic drug: NEEDLE (DISP) 18 G 1 mg by Does not apply route every 14 (fourteen) days.   BD Disp Needles 21G X 1-1/2 Misc Generic drug: NEEDLE (DISP) 21 G 1 mg by Does not apply route every 14 (fourteen) days.   chlorhexidine  4 % external liquid Commonly known as: HIBICLENS  Apply 15 mLs (1 Application total) topically as directed for 30 doses. Use as directed daily for 5 days every other week for 6 weeks.   cyanocobalamin 1000 MCG tablet Commonly known as: VITAMIN B12 Take 1,000 mcg by mouth daily.   docusate sodium  100 MG capsule Commonly known as: COLACE Take 1 capsule (100 mg total) by mouth 2 (two) times daily.   DULoxetine  60 MG capsule Commonly known as: CYMBALTA  Take 60 mg by mouth every morning.   enoxaparin  30 MG/0.3ML injection Commonly known as: LOVENOX  Inject 0.3 mLs (30 mg total) into the skin every 12 (twelve) hours. DC Lovenox  once INR therapeutic   fluticasone 50 MCG/ACT nasal spray Commonly known as: FLONASE Place 2 sprays into both nostrils daily as needed for allergies.   gabapentin  300 MG capsule Commonly known as: NEURONTIN  Take 300 mg by mouth 4 (four) times daily.   HYDROcodone -acetaminophen  5-325 MG tablet Commonly known as: NORCO/VICODIN Take 1 tablet by mouth every 6 (six) hours.    lisinopril  5 MG tablet Commonly known as: ZESTRIL  Take 5 mg by mouth daily.   metFORMIN 500 MG 24 hr tablet Commonly known as: GLUCOPHAGE-XR Take 1,000 mg by mouth every evening.   ondansetron  4 MG tablet Commonly known as: ZOFRAN  Take 1 tablet (4 mg total) by mouth every 6 (six) hours as needed for nausea.   oxyCODONE  5 MG immediate release tablet Commonly known as: Roxicodone  Take 0.5-1 tablets (2.5-5 mg total) by mouth every 6 (six) hours as needed for breakthrough pain.   pantoprazole  40 MG tablet Commonly known as: PROTONIX  Take 40 mg by mouth 2 (two) times daily.   pravastatin  20 MG tablet Commonly known as: PRAVACHOL  Take 20 mg by mouth at bedtime.   propranolol  10 MG tablet Commonly known as: INDERAL  Take 10 mg by mouth 4 (four) times daily as needed (Takes for Tremors).   tadalafil  20 MG tablet Commonly known as: CIALIS  Take 1 tablet (20 mg total) by mouth daily as needed for erectile dysfunction.   testosterone  cypionate 200 MG/ML injection Commonly known as: DEPOTESTOSTERONE CYPIONATE Inject 1 mL (200 mg total) into the muscle every 14 (fourteen) days.   Vitamin D-3 125 MCG (5000 UT) Tabs Take 5,000 Units by mouth daily.   warfarin 5 MG tablet Commonly known as: COUMADIN  Take 2.5-5 mg by mouth daily. 5 mg M, W, F and 2.5 mg on T, Th, Sat and Sun        Allergies:  Allergies  Allergen Reactions   Penicillins     Other reaction(s):unknown,as a child    Family History: Family History  Problem Relation Age of Onset   Thrombosis Other    Skin cancer Father    Kidney disease Neg Hx    Prostate cancer Neg Hx    Kidney cancer Neg Hx    Bladder Cancer Neg Hx     Social History:  reports that he has never smoked. He  has never used smokeless tobacco. He reports that he does not drink alcohol and does not use drugs.   Physical Exam: BP (!) 152/118   Pulse (!) 56   Temp 98.2 F (36.8 C)   Ht 6' (1.829 m)   Wt 292 lb (132.5 kg)   BMI 39.60  kg/m   Constitutional:  Well nourished. Alert and oriented, acute distress.  Sweating profusely.  HEENT: Dendron AT, moist mucus membranes.  Trachea midline Cardiovascular: No clubbing, cyanosis, or edema. Respiratory: Normal respiratory effort, no increased work of breathing. Skin: No rashes, bruises or suspicious lesions.  Injection site is clean and dry.  No bleeding or swelling.  Lymph: No cervical or inguinal adenopathy. Neurologic: Grossly intact, no focal deficits, moving all 4 extremities. Psychiatric: Normal mood and affect.   Laboratory Data: See EPIC and HPI I have reviewed the labs.    Pertinent Imaging: N/A    Assessment & Plan:    1. Medication Error - Patient received phenylephrine  100 mg/10 mL into the right gluteus - The intended medication AVEED  and that was not administered today - Patient was transferred to the emergency department for further monitoring and evaluation  2. Hypogonadism  - After today's event, patient wants to switch to Testopel  - We will check with his insurance and an urgent matter to see if it will cover the Testopel  and get him scheduled ASAP if it is a covered service   3. BPH with LUTS -PSA pending  -continue conservative management, avoiding bladder irritants and timed voiding's -Continue tamsulosin  0.4 mg daily  4. Erectile dysfunction:    -he will continue tadalafil  20 mg daily; refill given today   Return for To the ED; pending Testopel  PA .  CLOTILDA HELON RIGGERS   [facility] [address] [place], [place] [address] [phone]

## 2023-11-09 NOTE — ED Provider Notes (Signed)
 [facility] Provider Note    Event Date/Time   First MD Initiated Contact with Patient 11/09/23 1218     (approximate)   History   Medication Reaction   HPI  Lance [name] is a 73 y.o. male with a history of hypertension who presents with an accidental administration of medication.  The patient was at [facility] and apparently received IM phenylephrine  instead of the testosterone  he was intended to receive.  This was shortly after 10 AM.  Per the patient, his blood pressure went as high as 200 systolic and he started to feel lightheaded.  The patient states that he is feeling better now although still feels slightly woozy.  He denies any chest pain or difficulty breathing.  He has no palpitations.  Per the triage RN, they were told that the patient received 100 mg of phenylephrine .  I reviewed the past medical records.  The patient's most recent outpatient encounter was with orthopedic surgery on 6/24 for follow-up after hip replacement.  I am unable to see the urology notes pertinent to today's visit.   Physical Exam   Triage Vital Signs: ED Triage Vitals  Encounter Vitals Group     BP 11/09/23 1215 (!) 101/58     Girls Systolic BP Percentile --      Girls Diastolic BP Percentile --      Boys Systolic BP Percentile --      Boys Diastolic BP Percentile --      Pulse Rate 11/09/23 1215 63     Resp 11/09/23 1215 17     Temp 11/09/23 1215 97.8 F (36.6 C)     Temp Source 11/09/23 1215 Oral     SpO2 11/09/23 1215 98 %     Weight 11/09/23 1214 290 lb (131.5 kg)     Height 11/09/23 1214 6' 1 (1.854 m)     Head Circumference --      Peak Flow --      Pain Score 11/09/23 1213 5     Pain Loc --      Pain Education --      Exclude from Growth Chart --     Most recent vital signs: Vitals:   11/09/23 1500 11/09/23 1515  BP: (!) 123/59 (!) 116/55  Pulse: (!) 54 (!) 58  Resp: 17 18  Temp:    SpO2: 98% 99%      General: Awake, no distress.  CV:  Good peripheral perfusion.  Resp:  Normal effort.  Abd:  No distention.  Other:  No peripheral edema.   ED Results / Procedures / Treatments   Labs (all labs ordered are listed, but only abnormal results are displayed) Labs Reviewed  BASIC METABOLIC PANEL WITH GFR - Abnormal; Notable for the following components:      Result Value   Glucose, Bld 118 (*)    All other components within normal limits  CBC WITH DIFFERENTIAL/PLATELET - Abnormal; Notable for the following components:   Hemoglobin 11.3 (*)    HCT 38.4 (*)    MCV 74.1 (*)    MCH 21.8 (*)    MCHC 29.4 (*)    RDW 22.5 (*)    All other components within normal limits     EKG  ED ECG REPORT I, Waylon Cassis, the attending physician, personally viewed and interpreted this ECG.  Date: 11/09/2023 EKG Time: 1321 Rate: 57 Rhythm: normal sinus rhythm QRS Axis: normal Intervals: normal ST/T Wave abnormalities: normal Narrative  Interpretation: no evidence of acute ischemia   RADIOLOGY   PROCEDURES:  Critical Care performed: No  Procedures   MEDICATIONS ORDERED IN ED: Medications  sodium chloride  0.9 % bolus 1,000 mL (0 mLs Intravenous Stopped 11/09/23 1512)     IMPRESSION / MDM / ASSESSMENT AND PLAN / ED COURSE  I reviewed the triage vital signs and the nursing notes.  73 year old male with PMH as noted above presents after an accidental administration of IM phenylephrine  approximately 2 hours ago.  He had hypertension and some lightheadedness at that time.  The symptoms are resolving.  His blood pressure and other vital signs are currently normal.  Differential diagnosis includes, but is not limited to, accidental medication overdose.  We will obtain basic labs, contact [facility] to confirm the dosage and timing, and then discussed with poison control to determine how long the patient will need to be observed.  Patient's presentation is most  consistent with acute presentation with potential threat to life or bodily function.  The patient is on the cardiac monitor to evaluate for evidence of arrhythmia and/or significant heart rate changes.  ----------------------------------------- 3:22 PM on 11/09/2023 -----------------------------------------  BMP and CBC show no acute findings.  The patient has been anemic for at least several months.  EKG is nonischemic.  I consulted and discussed case with the provider at the [facility] who recommended getting the EKG.  They advised that the patient could be observed for up to 6 hours although could be discharged sooner if her symptoms resolve.  Paradoxically, the patient's blood pressure has actually been ranging low while in the ED.  He has slight orthostatic lightheadedness.  Initially his orthostatics showed a significant blood pressure drop when he sat and stood.  After fluids, this has improved.  I suspect that this relative hypotension is an aftereffect of the phenylephrine .  He feels much better and would like to go home.  He is stable for discharge at this time.  I gave strict return precautions, and he expressed understanding.   FINAL CLINICAL IMPRESSION(S) / ED DIAGNOSES   Final diagnoses:  Adverse effect of phenylephrine      Rx / DC Orders   ED Discharge Orders     None        Note:  This document was prepared using Dragon voice recognition software and may include unintentional dictation errors.    Jacolyn Pae, MD 11/09/23 1524

## 2023-11-09 NOTE — ED Triage Notes (Signed)
 Patient to ED from [facility] after being given the wrong medication. Pt was suppose to receive testosterone  injection but was accidentally given 100mg  of phenylephrine . No complaints by patient at this time.

## 2023-11-10 ENCOUNTER — Telehealth: Payer: Self-pay

## 2023-11-10 LAB — PSA: Prostate Specific Ag, Serum: 0.4 ng/mL (ref 0.0–4.0)

## 2023-11-10 NOTE — Telephone Encounter (Signed)
 Called Pt to follow up on his ED visit  and office visit from yesterday.   Pt states he is feeling tired today. He states they gave him fluids/meds in the ED and sent him home after a few hours.   He is able to check his BP at home. Per Pt it is low but he does not know what the reading was.   Advised pt to continue to monitor his BP at home. If he feels light headed/dizzy he should contact his PCP or be seen at the ED.   Pt asked when he will be scheduled for his Testopel . Advised pt that I am working on contacting his insurance to determine if we need a prior serbia. Will let him know ASAP.   Pt voiced understanding.

## 2023-11-15 ENCOUNTER — Telehealth: Payer: Self-pay

## 2023-11-15 NOTE — Telephone Encounter (Signed)
 Called medicare Part B Provider line. S/W Gordy CROME  Ref#- [id number]  CPT 88019- Testopel  -does not require PA.

## 2023-11-23 ENCOUNTER — Other Ambulatory Visit: Payer: Self-pay

## 2023-11-23 DIAGNOSIS — E291 Testicular hypofunction: Secondary | ICD-10-CM

## 2023-11-23 NOTE — Telephone Encounter (Signed)
 Appts made by BT

## 2023-11-29 ENCOUNTER — Other Ambulatory Visit

## 2023-11-29 DIAGNOSIS — E291 Testicular hypofunction: Secondary | ICD-10-CM

## 2023-11-30 LAB — TESTOSTERONE: Testosterone: 91 ng/dL — ABNORMAL LOW (ref 264–916)

## 2023-12-18 NOTE — ED Provider Notes (Signed)
 [facility] Emergency Department Provider Note  ED Clinical Impression   Final diagnoses:  None    HPI, ED Course, Assessment and Plan   Initial Clinical Impression:  December 18, 2023 8:25 PM  History of Present Illness Lance [name] is a 73 year old male with a history of DVT who presents with a fall and head injury. He is accompanied by his wife, Lance [name].  He fell while unloading a car at home, hitting his head on a table, which caused a laceration with significant bleeding due to anticoagulation therapy. He has experienced multiple falls over the past year and has had difficulty with mobility recently. He denies chest pain, difficulty breathing, nausea, or vomiting prior to the fall. He has experienced dizziness throughout the week, especially when standing, and reports balance issues. Hydration is adequate despite recent travel.  He is on warfarin for anticoagulation due to a history of DVT and pulmonary embolisms. He has a hereditary factor V Leiden mutation. He was supposed to take his medications at 5 PM but fell before doing so, with the last dose of blood thinner taken yesterday. Patient also complaining of back pain that is diffuse and worsened since falling, otherwise consistent with chronic back pain.  BP 161/79   Pulse 78   Temp 36.9 C (98.5 F) (Oral)   Resp 16   SpO2 98%   Medical Decision Making Medical Decision Making A 73 year old male with a history of multiple joint replacements, chronic pain, Factor V Leiden thrombophilia, prior DVT and pulmonary embolism on warfarin, presented after a ground-level fall resulting in a head laceration. He reported recurrent falls over the past year, recent dizziness, and gait instability, but denied chest pain, shortness of breath, nausea, or vomiting prior to the fall. Exam revealed a scalp laceration, no focal neurological deficits, and chronic musculoskeletal pain. He is at increased risk for intracranial hemorrhage due to  anticoagulation.  Differential diagnosis includes, but is not limited to: - Intracranial Hemorrhage: Considered due to recent fall with head trauma while on warfarin, necessitating head imaging to rule out acute intracranial process such as subdural epidural hematoma, traumatic IPH. -Back injury such as fracture, subluxation or dislocation.  Will order CT of cervical, thoracic, lumbar spine. - Mechanical Fall with Gait Instability and Dizziness: Recurrent falls and dizziness without clear mechanical trigger, possibly related to chronic musculoskeletal issues or orthostatic changes; no evidence of syncope or acute neurologic event on history or exam.  Plan: Head laceration and risk of intracranial hemorrhage on anticoagulation - Order head imaging and cervical spine imaging. - Performed staples to close the head laceration per procedure note - Administered local anesthesia prior to stapling  Chronic pain and recurrent falls - Administered home pain medication (hydrocodone-acetaminophen) - Discharge home with instructions to follow up with primary care for further evaluation of gait instability and dizziness.  Patient is ambulatory with normal gait at time of discharge.  Further ED updates and updates to plan as per ED Course below:  ED Course as of 12/20/23 0516  Mon Dec 19, 2023  0021 CT Head Wo Contrast . No acute intracranial hemorrhage or depressed skull fracture. 2. No acute cervical, thoracic, or lumbar spine fracture. 3. Degenerative changes in the spine as noted above.     External Records Reviewed:   Independent Interpretation of Studies: I have independently interpreted the following studies: CT head  Discussion of Management With Other Providers or Support Staff: I discussed the management of this patient with the: Attending  physician  Considerations Regarding Disposition/Escalation of Care and Critical Care: Appropriate for outpatient management        Social  Drivers of Health with Concerns   Alcohol Use: Not on file  Physical Activity: Not on file  Stress: Not on file  Interpersonal Safety: Not on file  Substance Use: Not on file (03/17/2023)  Health Literacy: Not on file  Internet Connectivity: Not on file   _____________________________________________________________________  The case was discussed with the attending physician who is in agreement with the above assessment and plan  Past History   PAST MEDICAL HISTORY/PAST SURGICAL HISTORY:  Past Medical History[1]  Past Surgical History[2]  MEDICATIONS:  No current facility-administered medications for this encounter.  Current Outpatient Medications:  .  albuterol (PROVENTIL HFA;VENTOLIN HFA) 90 mcg/actuation inhaler, Inhale 2 puffs every four (4) hours as needed for wheezing., Disp: , Rfl:  .  cetirizine (ZYRTEC) 10 MG tablet, Take 1 tablet (10 mg total) by mouth daily as needed., Disp: , Rfl:  .  chlorhexidine (PERIDEX) 0.12 % solution, 15 mL by Mouth route two (2) times a day., Disp: 473 mL, Rfl: 0 .  cyclobenzaprine (FLEXERIL) 5 MG tablet, Take 1-2 tablets (5-10 mg total) by mouth daily as needed., Disp: , Rfl:  .  finasteride (PROSCAR) 5 mg tablet, Take 1 tablet (5 mg total) by mouth. Frequency:QD   Dosage:5   MG  Instructions:  Note:Dose: 5 MG (Patient not taking: Reported on 01/25/2023), Disp: , Rfl:  .  fluticasone (FLONASE) 50 mcg/actuation nasal spray, 1 spray into each nostril daily as needed for rhinitis., Disp: , Rfl:  .  gabapentin (NEURONTIN) 300 MG capsule, Take 1 capsule (300 mg total) by mouth. Frequency:TID   Dosage:300   MG  Instructions:  Note:Dose: 300MG , Disp: , Rfl:  .  gabapentin (NEURONTIN) 300 MG capsule, Take 1 capsule (300 mg total) by mouth., Disp: , Rfl:  .  hydrochlorothiazide (HYDRODIURIL) 25 MG tablet, Take 0.5 tablets (12.5 mg total) by mouth. Frequency:QD   Dosage:12.5   MG  Instructions:  Note:Dose: 12.5MG , Disp: , Rfl:  .  HYDROcodone-acetaminophen  (NORCO) 5-325 mg per tablet, 5-325 tablets. 1/2 to 1 tablet 3 times a day, Disp: , Rfl:  .  lansoprazole (PREVACID) 30 MG capsule, Take 1 capsule (30 mg total) by mouth. Frequency:QAM   Dosage:30   MG  Instructions:  Note:Dose: 30 MG (Patient not taking: Reported on 01/25/2023), Disp: , Rfl:  .  lisinopril (PRINIVIL,ZESTRIL) 20 MG tablet, Take 20 mg by mouth. Frequency:QD   Dosage:20   MG  Instructions:  Note:Dose: 20 MG (Patient not taking: Reported on 01/25/2023), Disp: , Rfl:  .  lisinopriL-hydrochlorothiazide (PRINZIDE,ZESTORETIC) 10-12.5 mg per tablet, Take 1 tablet by mouth., Disp: , Rfl:  .  MULTIVITAMIN ORAL, Take by mouth. Frequency:QD   Dosage:0.0     Instructions:  Note:Dose: 1 (Patient not taking: Reported on 10/18/2022), Disp: , Rfl:  .  pantoprazole (PROTONIX) 40 MG tablet, Take 1 tablet (40 mg total) by mouth., Disp: , Rfl:  .  polyethylene glycol (GLYCOLAX) 17 gram/dose powder, Take 8.5 g by mouth daily as needed. (Patient not taking: Reported on 01/25/2023), Disp: , Rfl:  .  pravastatin (PRAVACHOL) 20 MG tablet, Take 1 tablet (20 mg total) by mouth., Disp: , Rfl:  .  predniSONE (DELTASONE) 10 mg tablet pack, , Disp: , Rfl:  .  psyllium (METAMUCIL) 3.4 gram packet, Take 1 packet by mouth daily. (Patient not taking: Reported on 10/18/2022), Disp: , Rfl:  .  tamsulosin  (FLOMAX ) 0.4 mg capsule, Take 0.4 mg by mouth. (Patient not taking: Reported on 01/25/2023), Disp: , Rfl:  .  testosterone  (TESTOPEL ) 75 mg Pllt, Inject under the skin., Disp: , Rfl:  .  triamcinolone (KENALOG) 0.025 % cream, APPLY CREAM TOPICALLY TWICE DAILY FROM KNEES DOWN, Disp: , Rfl:  .  warfarin (COUMADIN ) 5 MG tablet, Take by mouth. Frequency:PHARMDIR   Dosage:0.0     Instructions:  Note:1/2 tablet mon,fri                 1 tablet the other days af the week Dose: 2.5/5 MG, Disp: , Rfl:   ALLERGIES:  Penicillins  SOCIAL HISTORY:  Social History   Tobacco Use  . Smoking status: Never  . Smokeless tobacco: Never   Substance Use Topics  . Alcohol use: Not on file    FAMILY HISTORY: Family History[3]   Review of Systems   A review of systems was performed and relevant portions were as noted above in HPI   Physical Exam   VITAL SIGNS:   BP 161/79   Pulse 78   Temp 36.9 C (98.5 F) (Oral)   Resp 16   SpO2 98%    Constitutional:  Alert and oriented.  Head:  Normocephalic and atraumatic Eyes:  Conjunctivae are normal, EOMI, PERRL ENT:  No notable congestion, Mucous membranes moist, External ears normal, no notable stridor Cardiovascular:  Rate as vitals above. Appears warm and well perfused Respiratory:  Normal respiratory effort. Breath sounds are normal. Gastrointestinal:  Soft, non-distended, and nontender.  Genitourinary:  Deferred Musculoskeletal:   Normal range of motion in all extremities. No tenderness or edema noted in B/L lower extremities Neurologic:  No gross focal neurologic deficits beyond baseline are appreciated. Skin:  Skin is warm, dry.  Stellate laceration approximately 3 cm x 3 cm on the posterior occipital scalp.  Bleeding controlled with direct pressure.  No contaminants.  Extensively washed out.   Radiology   CT Head Wo Contrast    (Results Pending)  CT Cervical Spine Wo Contrast    (Results Pending)    Labs   Labs Reviewed - No data to display   Pertinent labs & imaging results that were available during my care of the patient were reviewed by me and considered in my medical decision making (see chart for details).  Please note- This chart has been created using [employer]. Chart creation errors have been sought, but may not always be located and such creation errors, especially pronoun confusion, do NOT reflect on the standard of medical care.       [1] Past Medical History: Diagnosis Date  . Anemia   . Arthritis   . Chronic pain disorder   . Deep vein thrombosis (DVT) of left lower extremity      . Dental disease   . Depression    . Diabetes mellitus      . GERD (gastroesophageal reflux disease)   . Hypertension   . Migraine   . Sleep apnea, obstructive   [2] Past Surgical History: Procedure Laterality Date  . LAPAROSCOPIC INGUINAL HERNIA REPAIR Right   . REPLACEMENT TOTAL HIP W/  RESURFACING IMPLANTS Left   . TONSILLECTOMY    [3] History reviewed. No pertinent family history.  Malvin Elveria BIRCH, MD Resident 12/20/23 [phone]

## 2023-12-20 NOTE — Progress Notes (Signed)
   He presents today for Testopel  insertion.  Identified upper outer quadrant of right hip for insertion; prepped area with Betadine and injected 10 cc's of Lidocaine  1% to anesthetize superficially and distally along trocar tract.  Made 3 mm incision using 11 blade of scalpel; trocar with sharp ended stylet was inserted into subcutaneous tissue in line with femur. Sharp stylet was withdrawn and 6  pellets were placed into trocar well. Testopel  pellets advanced into tissue using blunt ended stylet. Trocar removed and incision closed using 6 Steri-Strips. Cleansed area to remove Betadine and covered Steri-Strips with outer Band-Aid.  Careful inspection of insertion is done and patient informed of post procedure instructions.  Advised patient to apply ice to the site for 20-30 minutes every hour if needed.  Avoid hot tubes, swimming or full water immersion of the insertion site for 72 hours.  Bandage may be removed after one week.    Patient is advised to contact the office if experiencing drainage of the insertion site, excessive redness or swelling of the site, chills and/or fevers > 101.5, nausea or vomiting, dizziness or lightheadedness and excessive tenderness.  Avoid strenuous activity and heavy lifting for 72 hours.     He will return in one month for serum testosterone , hemoglobin and hematocrit.

## 2023-12-21 ENCOUNTER — Other Ambulatory Visit: Payer: Self-pay | Admitting: Family Medicine

## 2023-12-21 ENCOUNTER — Ambulatory Visit: Admitting: Urology

## 2023-12-21 VITALS — BP 110/50 | HR 78 | Ht 74.0 in | Wt 287.0 lb

## 2023-12-21 DIAGNOSIS — R29898 Other symptoms and signs involving the musculoskeletal system: Secondary | ICD-10-CM

## 2023-12-21 DIAGNOSIS — E291 Testicular hypofunction: Secondary | ICD-10-CM

## 2023-12-21 MED ORDER — TESTOSTERONE 75 MG IL PLLT
450.0000 mg | PELLET | Freq: Once | Status: AC
  Administered 2023-12-21 (×2): 450 mg

## 2023-12-24 ENCOUNTER — Inpatient Hospital Stay: Admission: RE | Admit: 2023-12-24 | Source: Ambulatory Visit

## 2023-12-27 ENCOUNTER — Ambulatory Visit
Admission: RE | Admit: 2023-12-27 | Discharge: 2023-12-27 | Disposition: A | Discharge status: Home / Self Care | Source: Ambulatory Visit | Attending: Family Medicine | Admitting: Family Medicine

## 2023-12-27 DIAGNOSIS — R29898 Other symptoms and signs involving the musculoskeletal system: Secondary | ICD-10-CM

## 2024-01-23 ENCOUNTER — Other Ambulatory Visit

## 2024-01-23 DIAGNOSIS — E291 Testicular hypofunction: Secondary | ICD-10-CM

## 2024-01-24 LAB — TESTOSTERONE: Testosterone: 497 ng/dL (ref 264–916)

## 2024-01-24 LAB — HEMOGLOBIN AND HEMATOCRIT, BLOOD
Hematocrit: 40 % (ref 37.5–51.0)
Hemoglobin: 12.1 g/dL — ABNORMAL LOW (ref 13.0–17.7)

## 2024-01-26 ENCOUNTER — Ambulatory Visit: Payer: Self-pay | Admitting: Urology

## 2024-02-13 ENCOUNTER — Ambulatory Visit: Attending: Nurse Practitioner | Admitting: Physical Therapy

## 2024-02-13 ENCOUNTER — Encounter: Payer: Self-pay | Admitting: Physical Therapy

## 2024-02-13 DIAGNOSIS — M6281 Muscle weakness (generalized): Secondary | ICD-10-CM | POA: Insufficient documentation

## 2024-02-13 DIAGNOSIS — R262 Difficulty in walking, not elsewhere classified: Secondary | ICD-10-CM | POA: Insufficient documentation

## 2024-02-13 DIAGNOSIS — R2681 Unsteadiness on feet: Secondary | ICD-10-CM | POA: Insufficient documentation

## 2024-02-13 NOTE — Therapy (Unsigned)
 OUTPATIENT PHYSICAL THERAPY BALANCE EVALUATION   Patient Name: Lance [name] MRN: [id number] DOB:18-May-1950, 73 y.o., male Today's Date: 02/13/2024   PT End of Session - 02/13/24 1102     Visit Number 1    Number of Visits 21    Date for Recertification  04/23/24    Authorization Type Medicare A&B    PT Start Time 1036    PT Stop Time 1118    PT Time Calculation (min) 42 min    Equipment Utilized During Treatment Gait belt    Behavior During Therapy WFL for tasks assessed/performed          Past Medical History:  Diagnosis Date   Acute cystitis    Allergic rhinitis    BPH (benign prostatic hyperplasia)    Bronchitis    chronic   Carpal tunnel syndrome    Complication of anesthesia    delayed emergence after fatty tumor surgery   DDD (degenerative disc disease), lumbar    whole body, joints   Deficiency, protein S    Degenerative lumbar spinal stenosis    chronic back pain, left leg and foot   Diabetes mellitus (HCC)    diet controlled   DISH (diffuse idiopathic skeletal hyperostosis)    neck pain   Dyspnea    with exertion, wheezing occasionally   Erectile dysfunction    Factor V Leiden    GERD (gastroesophageal reflux disease)    Gout    Headache    migraines-occasionally   History of DVT (deep vein thrombosis)    left leg-after left foot surgery   History of migraine headaches    History of pulmonary embolism    bilateral   HOH (hard of hearing)    wears aids   Hypercholesterolemia    Hypercoagulable state    Factor V   Hypertension    controlled on meds   Hypogonadism in male    Infection of skin    Iron deficiency anemia    Lower urinary tract symptoms    Meralgia paresthetica    Neck pain    Obesity    OSA on CPAP    Post-void dribbling    Prostatitis    Pulmonary embolism (HCC)    after carpal tunnel surgery   SNHL (sensorineural hearing loss)    Vertigo    occasional   Past Surgical History:  Procedure Laterality Date   Blood  vessel Surgery     CARPAL TUNNEL RELEASE Bilateral 2011   CATARACT EXTRACTION W/PHACO Right 10/17/2017   Procedure: CATARACT EXTRACTION PHACO AND INTRAOCULAR LENS PLACEMENT (IOC)  RIGHT DIABETIC;  Surgeon: Mittie Gaskin, MD;  Location: [facility];  Service: Ophthalmology;  Laterality: Right;  CPAP Diet controlled diabetic   CATARACT EXTRACTION W/PHACO Left 11/23/2017   Procedure: CATARACT EXTRACTION PHACO AND INTRAOCULAR LENS PLACEMENT (IOC)  LEFT DIABETIC;  Surgeon: Mittie Gaskin, MD;  Location: [facility];  Service: Ophthalmology;  Laterality: Left;  Diabetic  sleep apnea   COLONOSCOPY WITH PROPOFOL  N/A 07/11/2015   Procedure: COLONOSCOPY WITH PROPOFOL ;  Surgeon: Gladis RAYMOND Mariner, MD;  Location: [facility];  Service: Endoscopy;  Laterality: N/A;   COLONOSCOPY WITH PROPOFOL  N/A 02/02/2019   Procedure: COLONOSCOPY WITH PROPOFOL ;  Surgeon: Mariner Gladis RAYMOND, MD;  Location: [facility] ENDOSCOPY;  Service: Endoscopy;  Laterality: N/A;   FOOT SURGERY Bilateral    FOOT SURGERY Left 2003   complicated by DVT   HEMORRHOID SURGERY  1984   HERNIA REPAIR Bilateral 1985   x2  HOLEP-LASER ENUCLEATION OF THE PROSTATE WITH MORCELLATION  08/28/2014   Dr. Rosina Riis   HYDROCELE EXCISION / REPAIR  1985   x2   Hydrocelectomy     x 2   INNER EAR SURGERY     INSERTION OF MESH Right 10/01/2022   Procedure: INSERTION OF MESH;  Surgeon: Tye Millet, DO;  Location: [facility] ORS;  Service: General;  Laterality: Right;   IVC FILTER INSERTION N/A 07/14/2023   Procedure: IVC FILTER INSERTION;  Surgeon: Marea Selinda RAMAN, MD;  Location: [facility] INVASIVE CV [facility];  Service: Cardiovascular;  Laterality: N/A;   JOINT REPLACEMENT     left knee, right shoulder   LEG SURGERY Right 1974   fatty tumor   PROSTATE SURGERY     REPLACEMENT TOTAL KNEE Left 2012   SHOULDER SURGERY Right 1968   TONSILLECTOMY  1958   TOTAL HIP ARTHROPLASTY Left 07/18/2023   Procedure: ARTHROPLASTY, HIP, TOTAL, ANTERIOR  APPROACH;  Surgeon: Lorelle Hussar, MD;  Location: [facility] OR[facility];  Service: Orthopedics;  Laterality: Left;   TOTAL SHOULDER ARTHROPLASTY Right 09/18/2014   arthroplasty, glenohumeral joint; total shoulder (glenoid and prosimal humeral replacement)   TUMOR EXCISION Right    fatty tumor   VASECTOMY  1979   Patient Active Problem List   Diagnosis Date Noted   S/P total left hip arthroplasty 07/18/2023   Lymphedema 07/05/2023   Vitamin D deficiency 04/18/2021   History of DVT (deep vein thrombosis) 04/09/2020   History of pulmonary embolus (PE) 04/09/2020   Infection of prosthetic shoulder joint 04/09/2020   Pain syndrome, chronic 04/09/2020   History of CHF (congestive heart failure) 03/17/2020   Benign prostatic hyperplasia 10/02/2019   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 05/16/2019   Orthostasis 05/16/2019   Gout 12/28/2017   History of migraine headaches 12/28/2017   High risk medication use 04/22/2017   Primary osteoarthritis of right knee 04/11/2017   Testosterone  deficiency 01/18/2017   BMI 40.0-44.9, adult (HCC) 10/28/2016   Chronic use of opiate drug for therapeutic purpose 05/06/2016   Erectile dysfunction of organic origin 07/24/2015   History of adenomatous polyp of colon 07/11/2015   BPH with obstruction/lower urinary tract symptoms 04/10/2015   Hypogonadism in male 01/07/2015   Hammer toe of right foot 12/24/2014   Venous insufficiency 12/24/2014   H/O total knee replacement, left 12/12/2014   Deficiency, protein S 09/19/2014   S/P shoulder replacement, right 09/18/2014   Degenerative lumbar spinal stenosis 06/26/2014   Lumbar radiculitis 02/08/2014   Allergic rhinitis 10/16/2013   DDD (degenerative disc disease), lumbar 10/16/2013   Diabetes mellitus (HCC) 10/16/2013   Hypercholesterolemia 10/16/2013   Iron deficiency anemia due to chronic blood loss 10/16/2013   Long term current use of anticoagulant therapy 10/16/2013   Venous stasis  dermatitis of both lower extremities 10/16/2013   Type 2 diabetes mellitus with other specified complication (HCC) 10/16/2013   Arthritis of knee 08/18/2011   Low back pain 07/05/2011   Primary localized osteoarthrosis, lower leg 08/26/2010   Essential hypertension 12/02/2009   Gastro-esophageal reflux disease without esophagitis 12/02/2009   Obstructive sleep apnea 12/02/2009   Shortness of breath 12/02/2009   Pulmonary embolism and infarction (HCC) 12/02/2009    PCP: Clotilda VEAR Leaven, NP  REFERRING PROVIDER: Lauraine Lamarr Leak, NP  REFERRING DIAGNOSIS:  R29.6 (ICD-10-CM) - Repeated falls  R26.81 (ICD-10-CM) - Unsteadiness on feet    THERAPY DIAG: Unsteadiness on feet  Difficulty in walking, not elsewhere classified  Muscle weakness (generalized)  ONSET DATE:  Got worse over last year  FOLLOW UP APPT WITH PROVIDER: None currently in EMR   RATIONALE FOR EVALUATION AND TREATMENT: Rehabilitation  SUBJECTIVE:                                                                                                                                                                                         Chief Complaint: Pt is a 73 year old male with referral to PT for repeated falls and unsteadiness on feet.   Pertinent History Pt is a 73 year old male with referral to PT for repeated falls and unsteadiness on feet.   Pt reports he has fallen up to 9-10 times over last year. Patient reports one episode of passing out and falling due to this. Pt had notable head trauma with one fall hitting edge of table. Pt had L THA in March 2025. Pt believes that last two falls have been due to dehydration. He was taking diuretic medication - pt quit taking this medication and MD is aware of this. Hx of L ankle fusion (specific bones unspecified). Hx of L TKA. Hx of R TSA. Pt is on permanent blood thinners with hx of DVT/PE.   Pain: No Numbness/Tingling: Yes; Hx of neuropathy affecting LEs Focal  Weakness: Yes; L thigh gets very fatigued and wants to give way Recent changes in overall health/medication: Yes; medication updated for DM management Prior history of physical therapy for balance:  Yes; pt had PT at [facility] several times for balance  Falls: Has patient fallen in last 6 months? Yes, Number of falls: 3-4 Directional pattern for falls: No, variety of scenarios, intermittent stumbling on steps and with transfers    Imaging: Yes (C-spine imaging after head trauma with arm weakness and lightheadedness)  MRI for C-spine in August 2025 IMPRESSION: 1. No acute findings or clear explanation for the patient's symptoms. 2. Mildly progressive multilevel cervical spondylosis compared with previous MRI from 2014. There is mild spinal stenosis at C3-4 and C4-5 without cord deformity or abnormal cord signal. 3. Moderate to severe foraminal narrowing bilaterally at C3-4 and C4-5 appears progressive and could contribute to the patient's upper extremity symptoms. 4. 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   Prior level of function: Independent with community mobility with device Occupational demands: Retired Presenter, broadcasting: Going to grandchildren's sporting events   Red flags (bowel/bladder changes, saddle paresthesia, personal history of cancer, h/o spinal tumors, h/o compression fx, h/o abdominal aneurysm, abdominal pain, chills/fever, night sweats, nausea, vomiting, unrelenting pain): Negative  Precautions: Fall history, fall risk   Weight Bearing Restrictions: No  Living Environment Lives with: lives with their spouse; daughter and  her family live close to pt; other daughter is 15 min away Lives in: House/apartment  -Pt usually uses deck steps; 2 steps followed by flat and then additional 2 steps; pt grabs aluminum post to get into home from steps  Has following equipment at home: Single point cane, Walker - 2 wheeled, Crutches, and bed  side commode   Patient Goals: Not having falls    OBJECTIVE:   Patient Surveys  ABC: 73.1 %  Cognition Patient is oriented to person, place, and time.  Recent memory is intact.  Remote memory is intact.  Attention span and concentration are intact.  Expressive speech is intact.  Patient's fund of knowledge is within normal limits for educational level.   Gross Musculoskeletal Assessment Tremor: None Bulk: Normal Tone: Normal   GAIT: Distance walked: 80 ft Assistive device utilized: Single point cane Level of assistance: CGA Comments: L ankle overpronation/medial arch collapse and calcaneal valgus, L lateral shift, decreased L knee extension at terminal swing, decreased step cadence   Posture: Sacral sitting posture and kyphotic self-selected posture. L lateral shift and moderate rounded shoulders in standing   AROM  AROM (Normal range in degrees) AROM  02/13/2024      Knee    Flexion (135)    Extension (0)        Ankle    Dorsiflexion (20) Mod motion loss Mod motion loss  Plantarflexion (50) WNL WNL  Inversion (35)    Eversion (15    (* = pain; Blank rows = not tested)   LE MMT:  MMT (out of 5) Right 02/13/2024 Left 02/13/2024  Hip flexion 4 3+  Hip extension    Hip abduction 4 4  Hip adduction 5 5  Hip internal rotation    Hip external rotation    Knee flexion    Knee extension 5 5  Ankle dorsiflexion 4- 4  Ankle plantarflexion    Ankle inversion    Ankle eversion    (* = pain; Blank rows = not tested)   Sensation Diminished light touch sensation in stocking-glove distribution for bilateral feet/toes distal to malleoli. Proprioception, and hot/cold testing deferred on this date.   Reflexes Deferred   Cranial Nerves Deferred   Coordination/Cerebellar Finger to Nose: WNL Heel to Shin: WNL Rapid alternating movements: WNL Finger Opposition: WNL Pronator Drift: Negative   FUNCTIONAL OUTCOME MEASURES   Results Comments  BERG  Next visit/56 Fall risk, in need of intervention  DGI Next visit/24   TUG 21.2 seconds   5TSTS Unable to complete sit to stand   (Blank rows = not tested)    TODAY'S TREATMENT   02/13/24   Self-Care/Home Management - discussion on appropriate exercise/activity modification, PT education  Patient education on current condition, role of PT, prognosis, plan of care. Discussion on home safety/setup for safe environment to reduce risk of falls in home.      PATIENT EDUCATION:  Education details: see above for patient education details Person educated: Patient Education method: Medical illustrator Education comprehension: verbalized understanding   HOME EXERCISE PROGRAM: Formal HEP to be initiated at next visit   ASSESSMENT:  CLINICAL IMPRESSION: Patient is a 73 y.o. male who was seen today for physical therapy evaluation and treatment for imbalance/unsteadiness on feet with complicated orthopedic history including L THA, L TKA, Hx of R TSA x 3, L ankle fusion (specific joint unspecified, not shown in EMR), R knee OA. Pt has diabetic neuropathy and diminished sensation of bilateral feet. . Objective  impairments include Abnormal gait, decreased activity tolerance, decreased balance, decreased mobility, difficulty walking, decreased strength, impaired flexibility, improper body mechanics, postural dysfunction, and pain. These impairments are limiting patient from cleaning, laundry, driving, shopping, and community activity. Personal factors including Age, Past/current experiences, Time since onset of injury/illness/exacerbation, and 3+ comorbidities: (Type 2 DM, Factor V Leiden, Hx of gout, Hx of PE, HLD, Hx of multiple joint replacements and OA) are also affecting patient's functional outcome. Patient will benefit from skilled PT to address above impairments and improve overall function.  REHAB POTENTIAL: Good  CLINICAL DECISION MAKING: Unstable/unpredictable  EVALUATION  COMPLEXITY: High   GOALS: Goals reviewed with patient? No  SHORT TERM GOALS: Target date: 03/12/2024  Pt will be independent with HEP in order to improve strength and balance in order to decrease fall risk and improve function at home. Baseline: 02/13/24: Baseline HEP to be provided and reviewed on visit #2.  Goal status: INITIAL  By 4 weeks, pt will complete sit to stand without UE support indicative of improved upper limb strength/power to prevent falls and improved home-level functional mobility Baseline: 02/13/24: Heavy upper limb assist with sit to stand.  Goal status: INITIAL  LONG TERM GOALS: Target date: 04/19/2024   1.  Pt will improve BERG by at least 3 points in order to demonstrate clinically significant improvement in balance.   Baseline: 02/13/24: To be completed on visit # 2. Goal status: INITIAL  2.  Pt will improve ABC by at least 13% in order to demonstrate clinically significant improvement in balance confidence.      Baseline: 02/13/24: 73.1% Goal status: INITIAL  3. Pt will improve DGI by at least 3 points in order to demonstrate clinically significant improvement in balance and decreased risk for falls.     Baseline: 02/13/24: To be completed on visit #2. Goal status: INITIAL  4. Pt will decrease TUG to below 14 seconds/decrease in order to demonstrate decreased fall risk.  Baseline: 02/13/24: 21.2 sec Goal status: INITIAL    PLAN: PT FREQUENCY: 2x/week  PT DURATION: 10-12 weeks  PLANNED INTERVENTIONS: Therapeutic exercises, Therapeutic activity, Neuromuscular re-education, Balance training, Gait training, Patient/Family education, Joint manipulation, Joint mobilization, Canalith repositioning, Aquatic Therapy, Dry Needling, Cognitive remediation, Electrical stimulation, Spinal manipulation, Spinal mobilization, Cryotherapy, Moist heat, Traction, Ultrasound, Ionotophoresis 4mg /ml Dexamethasone , and Manual therapy  PLAN FOR NEXT SESSION: Complete BERG, DGI,  postural control testing. Coordination screen. Weight shifting and postural re-training/positional vertical training. LE strengthening.    Venetia Endo, PT, DPT #[id number]  Venetia ONEIDA Endo 02/13/2024, 11:19 AM

## 2024-02-14 ENCOUNTER — Encounter: Payer: Self-pay | Admitting: Physical Therapy

## 2024-02-14 NOTE — Therapy (Unsigned)
 OUTPATIENT PHYSICAL THERAPY BALANCE TREATMENT   Patient Name: Lance [name] MRN: [id number] DOB:July 14, 1950, 73 y.o., male Today's Date: 02/15/2024   PT End of Session - 02/15/24 1018     Visit Number 2    Number of Visits 21    Date for Recertification  04/23/24    Authorization Type Medicare A&B    PT Start Time 1023    PT Stop Time 1106    PT Time Calculation (min) 43 min    Equipment Utilized During Treatment Gait belt    Behavior During Therapy WFL for tasks assessed/performed          Past Medical History:  Diagnosis Date   Acute cystitis    Allergic rhinitis    BPH (benign prostatic hyperplasia)    Bronchitis    chronic   Carpal tunnel syndrome    Complication of anesthesia    delayed emergence after fatty tumor surgery   DDD (degenerative disc disease), lumbar    whole body, joints   Deficiency, protein S    Degenerative lumbar spinal stenosis    chronic back pain, left leg and foot   Diabetes mellitus (HCC)    diet controlled   DISH (diffuse idiopathic skeletal hyperostosis)    neck pain   Dyspnea    with exertion, wheezing occasionally   Erectile dysfunction    Factor V Leiden    GERD (gastroesophageal reflux disease)    Gout    Headache    migraines-occasionally   History of DVT (deep vein thrombosis)    left leg-after left foot surgery   History of migraine headaches    History of pulmonary embolism    bilateral   HOH (hard of hearing)    wears aids   Hypercholesterolemia    Hypercoagulable state    Factor V   Hypertension    controlled on meds   Hypogonadism in male    Infection of skin    Iron deficiency anemia    Lower urinary tract symptoms    Meralgia paresthetica    Neck pain    Obesity    OSA on CPAP    Post-void dribbling    Prostatitis    Pulmonary embolism (HCC)    after carpal tunnel surgery   SNHL (sensorineural hearing loss)    Vertigo    occasional   Past Surgical History:  Procedure Laterality Date   Blood  vessel Surgery     CARPAL TUNNEL RELEASE Bilateral 2011   CATARACT EXTRACTION W/PHACO Right 10/17/2017   Procedure: CATARACT EXTRACTION PHACO AND INTRAOCULAR LENS PLACEMENT (IOC)  RIGHT DIABETIC;  Surgeon: Mittie Gaskin, MD;  Location: [facility];  Service: Ophthalmology;  Laterality: Right;  CPAP Diet controlled diabetic   CATARACT EXTRACTION W/PHACO Left 11/23/2017   Procedure: CATARACT EXTRACTION PHACO AND INTRAOCULAR LENS PLACEMENT (IOC)  LEFT DIABETIC;  Surgeon: Mittie Gaskin, MD;  Location: [facility];  Service: Ophthalmology;  Laterality: Left;  Diabetic  sleep apnea   COLONOSCOPY WITH PROPOFOL  N/A 07/11/2015   Procedure: COLONOSCOPY WITH PROPOFOL ;  Surgeon: Gladis RAYMOND Mariner, MD;  Location: [facility] ENDOSCOPY;  Service: Endoscopy;  Laterality: N/A;   COLONOSCOPY WITH PROPOFOL  N/A 02/02/2019   Procedure: COLONOSCOPY WITH PROPOFOL ;  Surgeon: Mariner Gladis RAYMOND, MD;  Location: [facility] ENDOSCOPY;  Service: Endoscopy;  Laterality: N/A;   FOOT SURGERY Bilateral    FOOT SURGERY Left 2003   complicated by DVT   HEMORRHOID SURGERY  1984   HERNIA REPAIR Bilateral 1985   x2  HOLEP-LASER ENUCLEATION OF THE PROSTATE WITH MORCELLATION  08/28/2014   Dr. Rosina Riis   HYDROCELE EXCISION / REPAIR  1985   x2   Hydrocelectomy     x 2   INNER EAR SURGERY     INSERTION OF MESH Right 10/01/2022   Procedure: INSERTION OF MESH;  Surgeon: Tye Millet, DO;  Location: [facility] ORS;  Service: General;  Laterality: Right;   IVC FILTER INSERTION N/A 07/14/2023   Procedure: IVC FILTER INSERTION;  Surgeon: Marea Selinda RAMAN, MD;  Location: [facility] INVASIVE CV LAB;  Service: Cardiovascular;  Laterality: N/A;   JOINT REPLACEMENT     left knee, right shoulder   LEG SURGERY Right 1974   fatty tumor   PROSTATE SURGERY     REPLACEMENT TOTAL KNEE Left 2012   SHOULDER SURGERY Right 1968   TONSILLECTOMY  1958   TOTAL HIP ARTHROPLASTY Left 07/18/2023   Procedure: ARTHROPLASTY, HIP, TOTAL, ANTERIOR  APPROACH;  Surgeon: Lorelle Hussar, MD;  Location: [facility] ORS;  Service: Orthopedics;  Laterality: Left;   TOTAL SHOULDER ARTHROPLASTY Right 09/18/2014   arthroplasty, glenohumeral joint; total shoulder (glenoid and prosimal humeral replacement)   TUMOR EXCISION Right    fatty tumor   VASECTOMY  1979   Patient Active Problem List   Diagnosis Date Noted   S/P total left hip arthroplasty 07/18/2023   Lymphedema 07/05/2023   Vitamin D deficiency 04/18/2021   History of DVT (deep vein thrombosis) 04/09/2020   History of pulmonary embolus (PE) 04/09/2020   Infection of prosthetic shoulder joint 04/09/2020   Pain syndrome, chronic 04/09/2020   History of CHF (congestive heart failure) 03/17/2020   Benign prostatic hyperplasia 10/02/2019   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 05/16/2019   Orthostasis 05/16/2019   Gout 12/28/2017   History of migraine headaches 12/28/2017   High risk medication use 04/22/2017   Primary osteoarthritis of right knee 04/11/2017   Testosterone  deficiency 01/18/2017   BMI 40.0-44.9, adult (HCC) 10/28/2016   Chronic use of opiate drug for therapeutic purpose 05/06/2016   Erectile dysfunction of organic origin 07/24/2015   History of adenomatous polyp of colon 07/11/2015   BPH with obstruction/lower urinary tract symptoms 04/10/2015   Hypogonadism in male 01/07/2015   Hammer toe of right foot 12/24/2014   Venous insufficiency 12/24/2014   H/O total knee replacement, left 12/12/2014   Deficiency, protein S 09/19/2014   S/P shoulder replacement, right 09/18/2014   Degenerative lumbar spinal stenosis 06/26/2014   Lumbar radiculitis 02/08/2014   Allergic rhinitis 10/16/2013   DDD (degenerative disc disease), lumbar 10/16/2013   Diabetes mellitus (HCC) 10/16/2013   Hypercholesterolemia 10/16/2013   Iron deficiency anemia due to chronic blood loss 10/16/2013   Long term current use of anticoagulant therapy 10/16/2013   Venous stasis  dermatitis of both lower extremities 10/16/2013   Type 2 diabetes mellitus with other specified complication (HCC) 10/16/2013   Arthritis of knee 08/18/2011   Low back pain 07/05/2011   Primary localized osteoarthrosis, lower leg 08/26/2010   Essential hypertension 12/02/2009   Gastro-esophageal reflux disease without esophagitis 12/02/2009   Obstructive sleep apnea 12/02/2009   Shortness of breath 12/02/2009   Pulmonary embolism and infarction (HCC) 12/02/2009    PCP: Clotilda VEAR Leaven, NP  REFERRING PROVIDER: Lauraine Lamarr Leak, NP  REFERRING DIAGNOSIS:  R29.6 (ICD-10-CM) - Repeated falls  R26.81 (ICD-10-CM) - Unsteadiness on feet   THERAPY DIAG: Unsteadiness on feet  Difficulty in walking, not elsewhere classified  Muscle weakness (generalized)  ONSET DATE: Got  worse over last year  FOLLOW UP APPT WITH PROVIDER: None currently in EMR   RATIONALE FOR EVALUATION AND TREATMENT: Rehabilitation  PERTINENT HISTORY: Pt is a 73 year old male with referral to PT for repeated falls and unsteadiness on feet.   Pt reports he has fallen up to 9-10 times over last year. Patient reports one episode of passing out and falling due to this. Pt had notable head trauma with one fall hitting edge of table. Pt had L THA in March 2025. Pt believes that last two falls have been due to dehydration. He was taking diuretic medication - pt quit taking this medication and MD is aware of this. Hx of L ankle fusion (specific bones unspecified). Hx of L TKA. Hx of R TSA. Pt is on permanent blood thinners with hx of DVT/PE.   Pain: No Numbness/Tingling: Yes; Hx of neuropathy affecting LEs Focal Weakness: Yes; L thigh gets very fatigued and wants to give way Recent changes in overall health/medication: Yes; medication updated for DM management Prior history of physical therapy for balance:  Yes; pt had PT at [facility] several times for balance  Falls: Has patient fallen in last 6 months? Yes, Number of  falls: 3-4 Directional pattern for falls: No, variety of scenarios, intermittent stumbling on steps and with transfers    Imaging: Yes (C-spine imaging after head trauma with arm weakness and lightheadedness)  MRI for C-spine in August 2025 IMPRESSION: 1. No acute findings or clear explanation for the patient's symptoms. 2. Mildly progressive multilevel cervical spondylosis compared with previous MRI from 2014. There is mild spinal stenosis at C3-4 and C4-5 without cord deformity or abnormal cord signal. 3. Moderate to severe foraminal narrowing bilaterally at C3-4 and C4-5 appears progressive and could contribute to the patient's upper extremity symptoms. 4. 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   Red flags (bowel/bladder changes, saddle paresthesia, personal history of cancer, h/o spinal tumors, h/o compression fx, h/o abdominal aneurysm, abdominal pain, chills/fever, night sweats, nausea, vomiting, unrelenting pain): Negative  Precautions: Fall history, fall risk   Weight Bearing Restrictions: No  Living Environment Lives with: lives with their spouse; daughter and her family live close to pt; other daughter is 15 min away Lives in: House/apartment  -Pt usually uses deck steps; 2 steps followed by flat and then additional 2 steps; pt grabs aluminum post to get into home from steps  Has following equipment at home: Single point cane, Walker - 2 wheeled, Crutches, and bed side commode   Patient Goals: Not having falls    OBJECTIVE (data from initial evaluation unless otherwise dated):   Patient Surveys  ABC: 73.1 %  GAIT: Distance walked: 80 ft Assistive device utilized: Single point cane Level of assistance: CGA Comments: L ankle overpronation/medial arch collapse  and calcaneal valgus, L lateral shift, decreased L knee extension at terminal swing, decreased step cadence  Posture: Sacral sitting posture and kyphotic self-selected posture. L lateral shift and moderate rounded shoulders in standing  AROM  AROM (Normal range in degrees) AROM  02/13/2024      Knee    Flexion (135)    Extension (0)        Ankle    Dorsiflexion (20) Mod motion loss Mod motion loss  Plantarflexion (50) WNL WNL  Inversion (35)    Eversion (15    (* =  pain; Blank rows = not tested)   LE MMT:  MMT (out of 5) Right 02/13/2024 Left 02/13/2024  Hip flexion 4 3+  Hip extension    Hip abduction  Seated 4 Sidelying 4- Seated 4 Unable to assess sidelying  Hip adduction 5 5  Hip internal rotation    Hip external rotation    Knee flexion    Knee extension 5 5  Ankle dorsiflexion 4- 4  Ankle plantarflexion    Ankle inversion    Ankle eversion    (* = pain; Blank rows = not tested)   Sensation Diminished light touch sensation in stocking-glove distribution for bilateral feet/toes distal to malleoli. Proprioception, and hot/cold testing deferred on this date.   Coordination/Cerebellar Finger to Nose: WNL Heel to Shin: WNL Rapid alternating movements: WNL Finger Opposition: WNL Pronator Drift: Negative   FUNCTIONAL OUTCOME MEASURES   Results Comments  BERG 37/56 Fall risk, in need of intervention  DGI 11/24   TUG 21.2 seconds   5TSTS Unable to complete sit to stand   (Blank rows = not tested)   POSTURAL CONTROL TESTING  Romberg: EO WNL, EC 5 sec on first attempted (30 sec with follow-up attempt)  Clinical Test of Sensory Interaction for Balance    (CTSIB):  CONDITION TIME STRATEGY SWAY  Eyes open, firm surface 30 seconds ankle   Eyes closed, firm surface 30 seconds ankle   Eyes open, foam surface 30 seconds ankle   Eyes closed, foam surface 30 seconds ankle       TODAY'S TREATMENT   02/15/2024   SUBJECTIVE STATEMENT:   Patient reports no  notable changes or new concerns since last visit. He reports L thigh gives way and gets weak sometimes with prolonged standing/walking activity. He reports no major soreness after initial eval.    Therapeutic Exercise - improved strength as needed to improve performance of CKC activities/functional movements and as needed for power production to prevent fall during episode of large postural perturbation  NuStep; Level 3, x 5 minutes - for improved soft tissue mobility and increased tissue temperature to improve muscle performance   -subjective gathered during this time  Sit to stand; from raised table; 1 x 10 Sidelying hip abduction MMT: R 4-/5, L unable to assess due to inability to lie on right side  Standing hip abduction; 1 x 10 on either side  -for HEP review  Standing Heel raise/Toe raise; 1 x 10 alt  -for HEP review   Neuromuscular Re-education - for improved sensory integration, static and dynamic postural control, equilibrium and non-equilibrium coordination as needed for negotiating home and community environment and stepping over obstacles  -Completion of BERG and DGI (see Flowsheet) -Postural control testing (see above - mCTSIB)   [facility] PT Assessment - 02/15/24 0001       Standardized Balance Assessment   Standardized Balance Assessment Berg Balance Test;Dynamic Gait Index      Berg Balance Test   Sit to Stand Able to stand without using hands and stabilize independently    Standing Unsupported Able to stand safely 2 minutes    Sitting with Back Unsupported but Feet Supported on Floor or Stool Able to sit safely and securely 2 minutes    Stand to Sit Sits safely with minimal use of hands    Transfers Able to transfer with verbal cueing and /or supervision    Standing Unsupported with Eyes Closed Able to stand 10 seconds safely    Standing Unsupported with Feet Together Able  to place feet together independently and stand for 1 minute with supervision    From Standing,  Reach Forward with Outstretched Arm Can reach forward >12 cm safely (5)    From Standing Position, Pick up Object from Floor Unable to pick up shoe, but reaches 2-5 cm (1-2) from shoe and balances independently    From Standing Position, Turn to Look Behind Over each Shoulder Looks behind one side only/other side shows less weight shift    Turn 360 Degrees Able to turn 360 degrees safely but slowly    Standing Unsupported, Alternately Place Feet on Step/Stool Needs assistance to keep from falling or unable to try    Standing Unsupported, One Foot in Front Able to take small step independently and hold 30 seconds    Standing on One Leg Unable to try or needs assist to prevent fall    Total Score 37      Dynamic Gait Index   Level Surface Moderate Impairment    Change in Gait Speed Moderate Impairment    Gait with Horizontal Head Turns Mild Impairment    Gait with Vertical Head Turns Mild Impairment    Gait and Pivot Turn Mild Impairment    Step Over Obstacle Severe Impairment    Step Around Obstacles Mild Impairment    Steps Moderate Impairment    Total Score 11          SpO2 to 94% with SOB and fatigue during DGI testing   PATIENT EDUCATION:  Education details: see above for patient education details Person educated: Patient Education method: Explanation and Demonstration Education comprehension: verbalized understanding   HOME EXERCISE PROGRAM: Access Code: [email or web address] URL: [email or web address] Date: 02/15/2024 Prepared by: Venetia Endo  Exercises - Sit to Stand Without Arm Support  - 1-2 x daily - 7 x weekly - 2 sets - 10 reps - Standing Hip Abduction with Counter Support  - 1-2 x daily - 7 x weekly - 2 sets - 10 reps - Heel Toe Raises with Counter Support  - 1-2 x daily - 7 x weekly - 2-3 sets - 10 reps   ASSESSMENT:  CLINICAL IMPRESSION: Patient has significant L lateral shift of thoracolumbar spine and rests with R ileum higher and has  significant L pelvic drop that worsens with prolonged standing/weightbearing. Pt does have diagnosis of spinal stenosis with neurogenic claudication per Dr. Avanell which may be leading to significant LE weakness. We updated his testing today and pt has BERG and DGI indicative of heightened fall risk and significant gluteus medius weakness that can contribute to pelvic drop and asymmetric standing/gait along with complex orthopedic history. Patient will benefit from skilled PT to address above impairments and improve overall function.  REHAB POTENTIAL: Good  CLINICAL DECISION MAKING: Unstable/unpredictable  EVALUATION COMPLEXITY: High   GOALS: Goals reviewed with patient? No  SHORT TERM GOALS: Target date: 03/12/2024  Pt will be independent with HEP in order to improve strength and balance in order to decrease fall risk and improve function at home. Baseline: 02/13/24: Baseline HEP to be provided and reviewed on visit #2.    02/15/24: Baseline HEP initiated Goal status: INITIAL  By 4 weeks, pt will complete sit to stand without UE support indicative of improved upper limb strength/power to prevent falls and improved home-level functional mobility Baseline: 02/13/24: Heavy upper limb assist with sit to stand.  Goal status: INITIAL  LONG TERM GOALS: Target date: 04/19/2024   1.  Pt will improve BERG by at least 3  points in order to demonstrate clinically significant improvement in balance.   Baseline: 02/13/24: To be completed on visit # 2.     02/15/24: 37/56 Goal status: INITIAL  2.  Pt will improve ABC by at least 13% in order to demonstrate clinically significant improvement in balance confidence.      Baseline: 02/13/24: 73.1% Goal status: INITIAL  3. Pt will improve DGI by at least 3 points in order to demonstrate clinically significant improvement in balance and decreased risk for falls.     Baseline: 02/13/24: To be completed on visit #2.     02/15/24: 11/24 Goal status:  INITIAL  4. Pt will decrease TUG to below 14 seconds/decrease in order to demonstrate decreased fall risk.  Baseline: 02/13/24: 21.2 sec Goal status: INITIAL    PLAN: PT FREQUENCY: 2x/week  PT DURATION: 10-12 weeks  PLANNED INTERVENTIONS: Therapeutic exercises, Therapeutic activity, Neuromuscular re-education, Balance training, Gait training, Patient/Family education, Joint manipulation, Joint mobilization, Canalith repositioning, Aquatic Therapy, Dry Needling, Cognitive remediation, Electrical stimulation, Spinal manipulation, Spinal mobilization, Cryotherapy, Moist heat, Traction, Ultrasound, Ionotophoresis 4mg /ml Dexamethasone , and Manual therapy  PLAN FOR NEXT SESSION: Weight shifting and postural re-training/positional vertical training. LE strengthening/gluteus medius strengthening.    Venetia Endo, PT, DPT #[id number]  Venetia ONEIDA Endo 02/15/2024, 11:04 AM

## 2024-02-15 ENCOUNTER — Encounter: Payer: Self-pay | Admitting: Physical Therapy

## 2024-02-15 ENCOUNTER — Ambulatory Visit: Admitting: Physical Therapy

## 2024-02-15 DIAGNOSIS — R2681 Unsteadiness on feet: Secondary | ICD-10-CM | POA: Diagnosis not present

## 2024-02-15 DIAGNOSIS — R262 Difficulty in walking, not elsewhere classified: Secondary | ICD-10-CM

## 2024-02-15 DIAGNOSIS — M6281 Muscle weakness (generalized): Secondary | ICD-10-CM

## 2024-02-20 ENCOUNTER — Ambulatory Visit: Admitting: Physical Therapy

## 2024-02-20 DIAGNOSIS — R262 Difficulty in walking, not elsewhere classified: Secondary | ICD-10-CM

## 2024-02-20 DIAGNOSIS — R2681 Unsteadiness on feet: Secondary | ICD-10-CM | POA: Diagnosis not present

## 2024-02-20 DIAGNOSIS — M6281 Muscle weakness (generalized): Secondary | ICD-10-CM

## 2024-02-20 NOTE — Therapy (Signed)
 OUTPATIENT PHYSICAL THERAPY BALANCE TREATMENT   Patient Name: Lance [name] MRN: [id number] DOB:January 05, 1951, 73 y.o., male Today's Date: 02/20/2024   PT End of Session - 02/20/24 1029     Visit Number 3    Number of Visits 21    Date for Recertification  04/23/24    Authorization Type Medicare A&B    PT Start Time 1029    PT Stop Time 1112    PT Time Calculation (min) 43 min    Equipment Utilized During Treatment Gait belt    Behavior During Therapy WFL for tasks assessed/performed           Past Medical History:  Diagnosis Date   Acute cystitis    Allergic rhinitis    BPH (benign prostatic hyperplasia)    Bronchitis    chronic   Carpal tunnel syndrome    Complication of anesthesia    delayed emergence after fatty tumor surgery   DDD (degenerative disc disease), lumbar    whole body, joints   Deficiency, protein S    Degenerative lumbar spinal stenosis    chronic back pain, left leg and foot   Diabetes mellitus (HCC)    diet controlled   DISH (diffuse idiopathic skeletal hyperostosis)    neck pain   Dyspnea    with exertion, wheezing occasionally   Erectile dysfunction    Factor V Leiden    GERD (gastroesophageal reflux disease)    Gout    Headache    migraines-occasionally   History of DVT (deep vein thrombosis)    left leg-after left foot surgery   History of migraine headaches    History of pulmonary embolism    bilateral   HOH (hard of hearing)    wears aids   Hypercholesterolemia    Hypercoagulable state    Factor V   Hypertension    controlled on meds   Hypogonadism in male    Infection of skin    Iron deficiency anemia    Lower urinary tract symptoms    Meralgia paresthetica    Neck pain    Obesity    OSA on CPAP    Post-void dribbling    Prostatitis    Pulmonary embolism (HCC)    after carpal tunnel surgery   SNHL (sensorineural hearing loss)    Vertigo    occasional   Past Surgical History:  Procedure Laterality Date    Blood vessel Surgery     CARPAL TUNNEL RELEASE Bilateral 2011   CATARACT EXTRACTION W/PHACO Right 10/17/2017   Procedure: CATARACT EXTRACTION PHACO AND INTRAOCULAR LENS PLACEMENT (IOC)  RIGHT DIABETIC;  Surgeon: Mittie Gaskin, MD;  Location: [facility];  Service: Ophthalmology;  Laterality: Right;  CPAP Diet controlled diabetic   CATARACT EXTRACTION W/PHACO Left 11/23/2017   Procedure: CATARACT EXTRACTION PHACO AND INTRAOCULAR LENS PLACEMENT (IOC)  LEFT DIABETIC;  Surgeon: Mittie Gaskin, MD;  Location: [facility];  Service: Ophthalmology;  Laterality: Left;  Diabetic  sleep apnea   COLONOSCOPY WITH PROPOFOL  N/A 07/11/2015   Procedure: COLONOSCOPY WITH PROPOFOL ;  Surgeon: Gladis RAYMOND Mariner, MD;  Location: [facility];  Service: Endoscopy;  Laterality: N/A;   COLONOSCOPY WITH PROPOFOL  N/A 02/02/2019   Procedure: COLONOSCOPY WITH PROPOFOL ;  Surgeon: Mariner Gladis RAYMOND, MD;  Location: [facility] ENDOSCOPY;  Service: Endoscopy;  Laterality: N/A;   FOOT SURGERY Bilateral    FOOT SURGERY Left 2003   complicated by DVT   HEMORRHOID SURGERY  1984   HERNIA REPAIR Bilateral 1985  x2   HOLEP-LASER ENUCLEATION OF THE PROSTATE WITH MORCELLATION  08/28/2014   Dr. Rosina Riis   HYDROCELE EXCISION / REPAIR  1985   x2   Hydrocelectomy     x 2   INNER EAR SURGERY     INSERTION OF MESH Right 10/01/2022   Procedure: INSERTION OF MESH;  Surgeon: Tye Millet, DO;  Location: [facility] ORS;  Service: General;  Laterality: Right;   IVC FILTER INSERTION N/A 07/14/2023   Procedure: IVC FILTER INSERTION;  Surgeon: Marea Selinda RAMAN, MD;  Location: [facility] IN[facility];  Service: Cardiovascular;  Laterality: N/A;   JOINT REPLACEMENT     left knee, right shoulder   LEG SURGERY Right 1974   fatty tumor   PROSTATE SURGERY     REPLACEMENT TOTAL KNEE Left 2012   SHOULDER SURGERY Right 1968   TONSILLECTOMY  1958   TOTAL HIP ARTHROPLASTY Left 07/18/2023   Procedure: ARTHROPLASTY, HIP, TOTAL,  ANTERIOR APPROACH;  Surgeon: Lorelle Hussar, MD;  Location: [facility];  Service: Orthopedics;  Laterality: Left;   TOTAL SHOULDER ARTHROPLASTY Right 09/18/2014   arthroplasty, glenohumeral joint; total shoulder (glenoid and prosimal humeral replacement)   TUMOR EXCISION Right    fatty tumor   VASECTOMY  1979   Patient Active Problem List   Diagnosis Date Noted   S/P total left hip arthroplasty 07/18/2023   Lymphedema 07/05/2023   Vitamin D deficiency 04/18/2021   History of DVT (deep vein thrombosis) 04/09/2020   History of pulmonary embolus (PE) 04/09/2020   Infection of prosthetic shoulder joint 04/09/2020   Pain syndrome, chronic 04/09/2020   History of CHF (congestive heart failure) 03/17/2020   Benign prostatic hyperplasia 10/02/2019   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 05/16/2019   Orthostasis 05/16/2019   Gout 12/28/2017   History of migraine headaches 12/28/2017   High risk medication use 04/22/2017   Primary osteoarthritis of right knee 04/11/2017   Testosterone  deficiency 01/18/2017   BMI 40.0-44.9, adult (HCC) 10/28/2016   Chronic use of opiate drug for therapeutic purpose 05/06/2016   Erectile dysfunction of organic origin 07/24/2015   History of adenomatous polyp of colon 07/11/2015   BPH with obstruction/lower urinary tract symptoms 04/10/2015   Hypogonadism in male 01/07/2015   Hammer toe of right foot 12/24/2014   Venous insufficiency 12/24/2014   H/O total knee replacement, left 12/12/2014   Deficiency, protein S 09/19/2014   S/P shoulder replacement, right 09/18/2014   Degenerative lumbar spinal stenosis 06/26/2014   Lumbar radiculitis 02/08/2014   Allergic rhinitis 10/16/2013   DDD (degenerative disc disease), lumbar 10/16/2013   Diabetes mellitus (HCC) 10/16/2013   Hypercholesterolemia 10/16/2013   Iron deficiency anemia due to chronic blood loss 10/16/2013   Long term current use of anticoagulant therapy 10/16/2013   Venous  stasis dermatitis of both lower extremities 10/16/2013   Type 2 diabetes mellitus with other specified complication (HCC) 10/16/2013   Arthritis of knee 08/18/2011   Low back pain 07/05/2011   Primary localized osteoarthrosis, lower leg 08/26/2010   Essential hypertension 12/02/2009   Gastro-esophageal reflux disease without esophagitis 12/02/2009   Obstructive sleep apnea 12/02/2009   Shortness of breath 12/02/2009   Pulmonary embolism and infarction (HCC) 12/02/2009    PCP: Clotilda VEAR Leaven, NP  REFERRING PROVIDER: Lauraine Lamarr Leak, NP  REFERRING DIAGNOSIS:  R29.6 (ICD-10-CM) - Repeated falls  R26.81 (ICD-10-CM) - Unsteadiness on feet   THERAPY DIAG: Unsteadiness on feet  Difficulty in walking, not elsewhere classified  Muscle weakness (generalized)  ONSET DATE: Got worse over last year  FOLLOW UP APPT WITH PROVIDER: None currently in EMR   RATIONALE FOR EVALUATION AND TREATMENT: Rehabilitation  PERTINENT HISTORY: Pt is a 73 year old male with referral to PT for repeated falls and unsteadiness on feet.   Pt reports he has fallen up to 9-10 times over last year. Patient reports one episode of passing out and falling due to this. Pt had notable head trauma with one fall hitting edge of table. Pt had L THA in March 2025. Pt believes that last two falls have been due to dehydration. He was taking diuretic medication - pt quit taking this medication and MD is aware of this. Hx of L ankle fusion (specific bones unspecified). Hx of L TKA. Hx of R TSA. Pt is on permanent blood thinners with hx of DVT/PE.   Pain: No Numbness/Tingling: Yes; Hx of neuropathy affecting LEs Focal Weakness: Yes; L thigh gets very fatigued and wants to give way Recent changes in overall health/medication: Yes; medication updated for DM management Prior history of physical therapy for balance:  Yes; pt had PT at [facility] several times for balance  Falls: Has patient fallen in last 6 months? Yes,  Number of falls: 3-4 Directional pattern for falls: No, variety of scenarios, intermittent stumbling on steps and with transfers    Imaging: Yes (C-spine imaging after head trauma with arm weakness and lightheadedness)  MRI for C-spine in August 2025 IMPRESSION: 1. No acute findings or clear explanation for the patient's symptoms. 2. Mildly progressive multilevel cervical spondylosis compared with previous MRI from 2014. There is mild spinal stenosis at C3-4 and C4-5 without cord deformity or abnormal cord signal. 3. Moderate to severe foraminal narrowing bilaterally at C3-4 and C4-5 appears progressive and could contribute to the patient's upper extremity symptoms. 4. 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   Red flags (bowel/bladder changes, saddle paresthesia, personal history of cancer, h/o spinal tumors, h/o compression fx, h/o abdominal aneurysm, abdominal pain, chills/fever, night sweats, nausea, vomiting, unrelenting pain): Negative  Precautions: Fall history, fall risk   Weight Bearing Restrictions: No  Living Environment Lives with: lives with their spouse; daughter and her family live close to pt; other daughter is 15 min away Lives in: House/apartment  -Pt usually uses deck steps; 2 steps followed by flat and then additional 2 steps; pt grabs aluminum post to get into home from steps  Has following equipment at home: Single point cane, Walker - 2 wheeled, Crutches, and bed side commode   Patient Goals: Not having falls    OBJECTIVE (data from initial evaluation unless otherwise dated):   Patient Surveys  ABC: 73.1 %  GAIT: Distance walked: 80 ft Assistive device utilized: Single point cane Level of assistance: CGA Comments: L ankle overpronation/medial arch  collapse and calcaneal valgus, L lateral shift, decreased L knee extension at terminal swing, decreased step cadence  Posture: Sacral sitting posture and kyphotic self-selected posture. L lateral shift and moderate rounded shoulders in standing  AROM  AROM (Normal range in degrees) AROM  02/13/2024      Knee    Flexion (135)    Extension (0)        Ankle    Dorsiflexion (20) Mod motion loss Mod motion loss  Plantarflexion (50) WNL WNL  Inversion (35)    Eversion (15    (* =  pain; Blank rows = not tested)   LE MMT:  MMT (out of 5) Right 02/13/2024 Left 02/13/2024  Hip flexion 4 3+  Hip extension    Hip abduction  Seated 4 Sidelying 4- Seated 4 Unable to assess sidelying  Hip adduction 5 5  Hip internal rotation    Hip external rotation    Knee flexion    Knee extension 5 5  Ankle dorsiflexion 4- 4  Ankle plantarflexion    Ankle inversion    Ankle eversion    (* = pain; Blank rows = not tested)   Sensation Diminished light touch sensation in stocking-glove distribution for bilateral feet/toes distal to malleoli. Proprioception, and hot/cold testing deferred on this date.   Coordination/Cerebellar Finger to Nose: WNL Heel to Shin: WNL Rapid alternating movements: WNL Finger Opposition: WNL Pronator Drift: Negative   FUNCTIONAL OUTCOME MEASURES   Results Comments  BERG 37/56 Fall risk, in need of intervention  DGI 11/24   TUG 21.2 seconds   5TSTS Unable to complete sit to stand   (Blank rows = not tested)   POSTURAL CONTROL TESTING  Romberg: EO WNL, EC 5 sec on first attempted (30 sec with follow-up attempt)  Clinical Test of Sensory Interaction for Balance    (CTSIB):  CONDITION TIME STRATEGY SWAY  Eyes open, firm surface 30 seconds ankle   Eyes closed, firm surface 30 seconds ankle   Eyes open, foam surface 30 seconds ankle   Eyes closed, foam surface 30 seconds ankle       TODAY'S TREATMENT   02/20/2024   SUBJECTIVE STATEMENT:   Patient  reports feeling confident at end of last week and was walking up stairs with items in hand while not holding onto railing, his foot hit edge of step and he feel forward resulting in contusion/abrasion along R proximal tibia (this happened Friday 02/17/24). He reports notable soreness for a couple of days after - he is feeling better today. He has remaining bruise along R tibial plateau. Patient had f/u with Benton Dowse, NP and will be considering another round of lumbar spine injections. Pt has not completed much home exercise due to fall on Friday.    Therapeutic Exercise - improved strength as needed to improve performance of CKC activities/functional movements and as needed for power production to prevent fall during episode of large postural perturbation    Ambulate laps around gym with SPC; x 3   -PT SBA  NuStep; Level 4, x 5 minutes - for improved soft tissue mobility and increased tissue temperature to improve muscle performance   -subjective gathered during this time  Sit to stand; from raised table; 2 x 10  Standing hip abduction; 2 x 10 on either side, adjacent to treadmill handrail for UE suppor  Standing Heel raise/Toe raise; 2 x 10 alt, adjacent to treadmill handrail   Neuromuscular Re-education - for improved sensory integration, static and dynamic postural control, equilibrium and non-equilibrium coordination as needed for negotiating home and community environment and stepping over obstacles  Toe tapping, 6-inch step; 2 x 10 alt  Standing feet together at handrail; 2 x 30 sec     PATIENT EDUCATION:  Education details: see above for patient education details Person educated: Patient Education method: Medical illustrator Education comprehension: verbalized understanding   HOME EXERCISE PROGRAM: Access Code: [email or web address] URL: [email or web address] Date: 02/15/2024 Prepared by: Venetia Endo  Exercises - Sit to Stand Without Arm Support  -  1-2 x daily - 7 x weekly -  2 sets - 10 reps - Standing Hip Abduction with Counter Support  - 1-2 x daily - 7 x weekly - 2 sets - 10 reps - Heel Toe Raises with Counter Support  - 1-2 x daily - 7 x weekly - 2-3 sets - 10 reps   ASSESSMENT:  CLINICAL IMPRESSION: Patient is continuing workup with spine group to address lower quarter and back pain. Patient presents with notable asymmetrical standing and shifts weight toward L side with notable L pelvic drop. We are working on hip strengthening to improve ability to support neutral pelvis position and may work into hip hiking with successive visits. We continued with general LE strengthening today, and pt tolerates most drills well (sit to stand performed from higher surface due to comorbid R knee pain/OA). Patient will benefit from skilled PT to address above impairments and improve overall function.  REHAB POTENTIAL: Good  CLINICAL DECISION MAKING: Unstable/unpredictable  EVALUATION COMPLEXITY: High   GOALS: Goals reviewed with patient? No  SHORT TERM GOALS: Target date: 03/12/2024  Pt will be independent with HEP in order to improve strength and balance in order to decrease fall risk and improve function at home. Baseline: 02/13/24: Baseline HEP to be provided and reviewed on visit #2.    02/15/24: Baseline HEP initiated Goal status: INITIAL  By 4 weeks, pt will complete sit to stand without UE support indicative of improved upper limb strength/power to prevent falls and improved home-level functional mobility Baseline: 02/13/24: Heavy upper limb assist with sit to stand.  Goal status: INITIAL  LONG TERM GOALS: Target date: 04/19/2024   1.  Pt will improve BERG by at least 3 points in order to demonstrate clinically significant improvement in balance.   Baseline: 02/13/24: To be completed on visit # 2.     02/15/24: 37/56 Goal status: INITIAL  2.  Pt will improve ABC by at least 13% in order to demonstrate clinically significant  improvement in balance confidence.      Baseline: 02/13/24: 73.1% Goal status: INITIAL  3. Pt will improve DGI by at least 3 points in order to demonstrate clinically significant improvement in balance and decreased risk for falls.     Baseline: 02/13/24: To be completed on visit #2.     02/15/24: 11/24 Goal status: INITIAL  4. Pt will decrease TUG to below 14 seconds/decrease in order to demonstrate decreased fall risk.  Baseline: 02/13/24: 21.2 sec Goal status: INITIAL    PLAN: PT FREQUENCY: 2x/week  PT DURATION: 10-12 weeks  PLANNED INTERVENTIONS: Therapeutic exercises, Therapeutic activity, Neuromuscular re-education, Balance training, Gait training, Patient/Family education, Joint manipulation, Joint mobilization, Canalith repositioning, Aquatic Therapy, Dry Needling, Cognitive remediation, Electrical stimulation, Spinal manipulation, Spinal mobilization, Cryotherapy, Moist heat, Traction, Ultrasound, Ionotophoresis 4mg /ml Dexamethasone , and Manual therapy  PLAN FOR NEXT SESSION: Weight shifting and postural re-training/positional vertical training. LE strengthening/gluteus medius strengthening.    Venetia Endo, PT, DPT #[id number]  Venetia ONEIDA Endo 02/20/2024, 10:29 AM

## 2024-02-22 ENCOUNTER — Ambulatory Visit: Admitting: Physical Therapy

## 2024-02-22 ENCOUNTER — Encounter: Payer: Self-pay | Admitting: Physical Therapy

## 2024-02-22 DIAGNOSIS — R2681 Unsteadiness on feet: Secondary | ICD-10-CM | POA: Diagnosis not present

## 2024-02-22 DIAGNOSIS — M6281 Muscle weakness (generalized): Secondary | ICD-10-CM

## 2024-02-22 DIAGNOSIS — R262 Difficulty in walking, not elsewhere classified: Secondary | ICD-10-CM

## 2024-02-22 NOTE — Therapy (Unsigned)
 OUTPATIENT PHYSICAL THERAPY BALANCE TREATMENT   Patient Name: Lance [name] MRN: [id number] DOB:06-07-1950, 73 y.o., male Today's Date: 02/22/2024   PT End of Session - 02/22/24 1025     Visit Number 4    Number of Visits 21    Date for Recertification  04/23/24    Authorization Type Medicare A&B    PT Start Time 1028    PT Stop Time 1114    PT Time Calculation (min) 46 min    Equipment Utilized During Treatment Gait belt    Behavior During Therapy WFL for tasks assessed/performed            Past Medical History:  Diagnosis Date   Acute cystitis    Allergic rhinitis    BPH (benign prostatic hyperplasia)    Bronchitis    chronic   Carpal tunnel syndrome    Complication of anesthesia    delayed emergence after fatty tumor surgery   DDD (degenerative disc disease), lumbar    whole body, joints   Deficiency, protein S    Degenerative lumbar spinal stenosis    chronic back pain, left leg and foot   Diabetes mellitus (HCC)    diet controlled   DISH (diffuse idiopathic skeletal hyperostosis)    neck pain   Dyspnea    with exertion, wheezing occasionally   Erectile dysfunction    Factor V Leiden    GERD (gastroesophageal reflux disease)    Gout    Headache    migraines-occasionally   History of DVT (deep vein thrombosis)    left leg-after left foot surgery   History of migraine headaches    History of pulmonary embolism    bilateral   HOH (hard of hearing)    wears aids   Hypercholesterolemia    Hypercoagulable state    Factor V   Hypertension    controlled on meds   Hypogonadism in male    Infection of skin    Iron deficiency anemia    Lower urinary tract symptoms    Meralgia paresthetica    Neck pain    Obesity    OSA on CPAP    Post-void dribbling    Prostatitis    Pulmonary embolism (HCC)    after carpal tunnel surgery   SNHL (sensorineural hearing loss)    Vertigo    occasional   Past Surgical History:  Procedure Laterality Date    Blood vessel Surgery     CARPAL TUNNEL RELEASE Bilateral 2011   CATARACT EXTRACTION W/PHACO Right 10/17/2017   Procedure: CATARACT EXTRACTION PHACO AND INTRAOCULAR LENS PLACEMENT (IOC)  RIGHT DIABETIC;  Surgeon: Mittie Gaskin, MD;  Location: [facility];  Service: Ophthalmology;  Laterality: Right;  CPAP Diet controlled diabetic   CATARACT EXTRACTION W/PHACO Left 11/23/2017   Procedure: CATARACT EXTRACTION PHACO AND INTRAOCULAR LENS PLACEMENT (IOC)  LEFT DIABETIC;  Surgeon: Mittie Gaskin, MD;  Location: [facility];  Service: Ophthalmology;  Laterality: Left;  Diabetic  sleep apnea   COLONOSCOPY WITH PROPOFOL  N/A 07/11/2015   Procedure: COLONOSCOPY WITH PROPOFOL ;  Surgeon: Gladis RAYMOND Mariner, MD;  Location: [facility] ENDOSCOPY;  Service: Endoscopy;  Laterality: N/A;   COLONOSCOPY WITH PROPOFOL  N/A 02/02/2019   Procedure: COLONOSCOPY WITH PROPOFOL ;  Surgeon: Mariner Gladis RAYMOND, MD;  Location: [facility] ENDOSCOPY;  Service: Endoscopy;  Laterality: N/A;   FOOT SURGERY Bilateral    FOOT SURGERY Left 2003   complicated by DVT   HEMORRHOID SURGERY  1984   HERNIA REPAIR Bilateral 1985  x2   HOLEP-LASER ENUCLEATION OF THE PROSTATE WITH MORCELLATION  08/28/2014   Dr. Rosina Riis   HYDROCELE EXCISION / REPAIR  1985   x2   Hydrocelectomy     x 2   INNER EAR SURGERY     INSERTION OF MESH Right 10/01/2022   Procedure: INSERTION OF MESH;  Surgeon: Tye Millet, DO;  Location: [facility] OR[facility];  Service: General;  Laterality: Right;   IVC FILTER INSERTION N/A 07/14/2023   Procedure: IVC FILTER INSERTION;  Surgeon: Marea Selinda RAMAN, MD;  Location: [facility] IN[facility]AS[facility];  Service: Cardiovascular;  Laterality: N/A;   JOINT REPLACEMENT     left knee, right shoulder   LEG SURGERY Right 1974   fatty tumor   PROSTATE SURGERY     REPLACEMENT TOTAL KNEE Left 2012   SHOULDER SURGERY Right 1968   TONSILLECTOMY  1958   TOTAL HIP ARTHROPLASTY Left 07/18/2023   Procedure: ARTHROPLASTY, HIP, TOTAL,  ANTERIOR APPROACH;  Surgeon: Lorelle Hussar, MD;  Location: [facility];  Service: Orthopedics;  Laterality: Left;   TOTAL SHOULDER ARTHROPLASTY Right 09/18/2014   arthroplasty, glenohumeral joint; total shoulder (glenoid and prosimal humeral replacement)   TUMOR EXCISION Right    fatty tumor   VASECTOMY  1979   Patient Active Problem List   Diagnosis Date Noted   S/P total left hip arthroplasty 07/18/2023   Lymphedema 07/05/2023   Vitamin D deficiency 04/18/2021   History of DVT (deep vein thrombosis) 04/09/2020   History of pulmonary embolus (PE) 04/09/2020   Infection of prosthetic shoulder joint 04/09/2020   Pain syndrome, chronic 04/09/2020   History of CHF (congestive heart failure) 03/17/2020   Benign prostatic hyperplasia 10/02/2019   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 05/16/2019   Orthostasis 05/16/2019   Gout 12/28/2017   History of migraine headaches 12/28/2017   High risk medication use 04/22/2017   Primary osteoarthritis of right knee 04/11/2017   Testosterone  deficiency 01/18/2017   BMI 40.0-44.9, adult (HCC) 10/28/2016   Chronic use of opiate drug for therapeutic purpose 05/06/2016   Erectile dysfunction of organic origin 07/24/2015   History of adenomatous polyp of colon 07/11/2015   BPH with obstruction/lower urinary tract symptoms 04/10/2015   Hypogonadism in male 01/07/2015   Hammer toe of right foot 12/24/2014   Venous insufficiency 12/24/2014   H/O total knee replacement, left 12/12/2014   Deficiency, protein S 09/19/2014   S/P shoulder replacement, right 09/18/2014   Degenerative lumbar spinal stenosis 06/26/2014   Lumbar radiculitis 02/08/2014   Allergic rhinitis 10/16/2013   DDD (degenerative disc disease), lumbar 10/16/2013   Diabetes mellitus (HCC) 10/16/2013   Hypercholesterolemia 10/16/2013   Iron deficiency anemia due to chronic blood loss 10/16/2013   Long term current use of anticoagulant therapy 10/16/2013   Venous  stasis dermatitis of both lower extremities 10/16/2013   Type 2 diabetes mellitus with other specified complication (HCC) 10/16/2013   Arthritis of knee 08/18/2011   Low back pain 07/05/2011   Primary localized osteoarthrosis, lower leg 08/26/2010   Essential hypertension 12/02/2009   Gastro-esophageal reflux disease without esophagitis 12/02/2009   Obstructive sleep apnea 12/02/2009   Shortness of breath 12/02/2009   Pulmonary embolism and infarction (HCC) 12/02/2009    PCP: Clotilda VEAR Leaven, NP  REFERRING PROVIDER: Lauraine Lamarr Leak, NP  REFERRING DIAGNOSIS:  R29.6 (ICD-10-CM) - Repeated falls  R26.81 (ICD-10-CM) - Unsteadiness on feet   THERAPY DIAG: Unsteadiness on feet  Difficulty in walking, not elsewhere classified  Muscle weakness (generalized)  ONSET DATE: Got worse over last year  FOLLOW UP APPT WITH PROVIDER: None currently in EMR   RATIONALE FOR EVALUATION AND TREATMENT: Rehabilitation  PERTINENT HISTORY: Pt is a 73 year old male with referral to PT for repeated falls and unsteadiness on feet.   Pt reports he has fallen up to 9-10 times over last year. Patient reports one episode of passing out and falling due to this. Pt had notable head trauma with one fall hitting edge of table. Pt had L THA in March 2025. Pt believes that last two falls have been due to dehydration. He was taking diuretic medication - pt quit taking this medication and MD is aware of this. Hx of L ankle fusion (specific bones unspecified). Hx of L TKA. Hx of R TSA. Pt is on permanent blood thinners with hx of DVT/PE.   Pain: No Numbness/Tingling: Yes; Hx of neuropathy affecting LEs Focal Weakness: Yes; L thigh gets very fatigued and wants to give way Recent changes in overall health/medication: Yes; medication updated for DM management Prior history of physical therapy for balance:  Yes; pt had PT at [facility] several times for balance  Falls: Has patient fallen in last 6 months? Yes,  Number of falls: 3-4 Directional pattern for falls: No, variety of scenarios, intermittent stumbling on steps and with transfers    Imaging: Yes (C-spine imaging after head trauma with arm weakness and lightheadedness)  MRI for C-spine in August 2025 IMPRESSION: 1. No acute findings or clear explanation for the patient's symptoms. 2. Mildly progressive multilevel cervical spondylosis compared with previous MRI from 2014. There is mild spinal stenosis at C3-4 and C4-5 without cord deformity or abnormal cord signal. 3. Moderate to severe foraminal narrowing bilaterally at C3-4 and C4-5 appears progressive and could contribute to the patient's upper extremity symptoms. 4. 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   Red flags (bowel/bladder changes, saddle paresthesia, personal history of cancer, h/o spinal tumors, h/o compression fx, h/o abdominal aneurysm, abdominal pain, chills/fever, night sweats, nausea, vomiting, unrelenting pain): Negative  Precautions: Fall history, fall risk   Weight Bearing Restrictions: No  Living Environment Lives with: lives with their spouse; daughter and her family live close to pt; other daughter is 15 min away Lives in: House/apartment  -Pt usually uses deck steps; 2 steps followed by flat and then additional 2 steps; pt grabs aluminum post to get into home from steps  Has following equipment at home: Single point cane, Walker - 2 wheeled, Crutches, and bed side commode   Patient Goals: Not having falls    OBJECTIVE (data from initial evaluation unless otherwise dated):   Patient Surveys  ABC: 73.1 %  GAIT: Distance walked: 80 ft Assistive device utilized: Single point cane Level of assistance: CGA Comments: L ankle overpronation/medial arch  collapse and calcaneal valgus, L lateral shift, decreased L knee extension at terminal swing, decreased step cadence  Posture: Sacral sitting posture and kyphotic self-selected posture. L lateral shift and moderate rounded shoulders in standing  AROM  AROM (Normal range in degrees) AROM  02/13/2024      Knee    Flexion (135)    Extension (0)        Ankle    Dorsiflexion (20) Mod motion loss Mod motion loss  Plantarflexion (50) WNL WNL  Inversion (35)    Eversion (15    (* =  pain; Blank rows = not tested)   LE MMT:  MMT (out of 5) Right 02/13/2024 Left 02/13/2024  Hip flexion 4 3+  Hip extension    Hip abduction  Seated 4 Sidelying 4- Seated 4 Unable to assess sidelying  Hip adduction 5 5  Hip internal rotation    Hip external rotation    Knee flexion    Knee extension 5 5  Ankle dorsiflexion 4- 4  Ankle plantarflexion    Ankle inversion    Ankle eversion    (* = pain; Blank rows = not tested)   Sensation Diminished light touch sensation in stocking-glove distribution for bilateral feet/toes distal to malleoli. Proprioception, and hot/cold testing deferred on this date.   Coordination/Cerebellar Finger to Nose: WNL Heel to Shin: WNL Rapid alternating movements: WNL Finger Opposition: WNL Pronator Drift: Negative   FUNCTIONAL OUTCOME MEASURES   Results Comments  BERG 37/56 Fall risk, in need of intervention  DGI 11/24   TUG 21.2 seconds   5TSTS Unable to complete sit to stand   (Blank rows = not tested)   POSTURAL CONTROL TESTING  Romberg: EO WNL, EC 5 sec on first attempted (30 sec with follow-up attempt)  Clinical Test of Sensory Interaction for Balance    (CTSIB):  CONDITION TIME STRATEGY SWAY  Eyes open, firm surface 30 seconds ankle   Eyes closed, firm surface 30 seconds ankle   Eyes open, foam surface 30 seconds ankle   Eyes closed, foam surface 30 seconds ankle       TODAY'S TREATMENT   02/22/2024   SUBJECTIVE STATEMENT:   Patient  reports no new incidents or concerns since last visit. He reports tolerating last visit well.    Therapeutic Exercise - improved strength as needed to improve performance of CKC activities/functional movements and as needed for power production to prevent fall during episode of large postural perturbation    Ambulate laps around gym with SPC; x 3   -PT SBA  NuStep; Level 5, x 5 minutes - for improved soft tissue mobility and increased tissue temperature to improve muscle performance   -subjective gathered during this time  Sit to stand; from raised table; 2 x 10  Sidestepping along blue agility ladder with Landy Tband tied just above knees; 2x D/B  For HEP update: SLR; 1 x 10 ea LE Bridge; 1 x 10  PATIENT EDUCATION: HEP update and review.      *not today*  Standing hip abduction; 2 x 10 on either side, adjacent to treadmill handrail for UE support Standing Heel raise/Toe raise; 2 x 10 alt, adjacent to treadmill handrail    Neuromuscular Re-education - for improved sensory integration, static and dynamic postural control, equilibrium and non-equilibrium coordination as needed for negotiating home and community environment and stepping over obstacles  Toe tapping, 6-inch step; 2 x 10 alt, with SPC L hand  Standing hip hike; x 20 with focus on hiking L hip, BUE support on handrails (base of staircase)  Standing feet together at handrail, on Airex; 2 x 30 sec  Staggered stance; R and LLE x 30 sec with adjacent treadmill handrail     PATIENT EDUCATION:  Education details: see above for patient education details Person educated: Patient Education method: Medical illustrator Education comprehension: verbalized understanding   HOME EXERCISE PROGRAM: Access Code: [email or web address] URL: [email or web address] Date: 02/22/2024 Prepared by: Venetia Endo  Exercises - Sit to Stand Without Arm Support  - 1-2 x daily - 7 x weekly - 2  sets - 10 reps - Standing  Hip Abduction with Counter Support  - 1-2 x daily - 7 x weekly - 2 sets - 10 reps - Heel Toe Raises with Counter Support  - 1-2 x daily - 7 x weekly - 2-3 sets - 10 reps - Supine Active Straight Leg Raise  - 2 x daily - 7 x weekly - 2 sets - 10 reps - Supine Bridge  - 1 x daily - 7 x weekly - 2-3 sets - 5-6 reps   ASSESSMENT:  CLINICAL IMPRESSION: Patient is continuing workup with spine group to address lower quarter and back pain. Patient presents with notable asymmetrical standing and shifts weight toward L side with notable L pelvic drop. We are working on hip strengthening to improve ability to support neutral pelvis position and may work into hip hiking with successive visits. We continued with general LE strengthening today, and pt tolerates most drills well (sit to stand performed from higher surface due to comorbid R knee pain/OA). Patient will benefit from skilled PT to address above impairments and improve overall function.  REHAB POTENTIAL: Good  CLINICAL DECISION MAKING: Unstable/unpredictable  EVALUATION COMPLEXITY: High   GOALS: Goals reviewed with patient? No  SHORT TERM GOALS: Target date: 03/12/2024  Pt will be independent with HEP in order to improve strength and balance in order to decrease fall risk and improve function at home. Baseline: 02/13/24: Baseline HEP to be provided and reviewed on visit #2.    02/15/24: Baseline HEP initiated Goal status: INITIAL  By 4 weeks, pt will complete sit to stand without UE support indicative of improved upper limb strength/power to prevent falls and improved home-level functional mobility Baseline: 02/13/24: Heavy upper limb assist with sit to stand.  Goal status: INITIAL  LONG TERM GOALS: Target date: 04/19/2024   1.  Pt will improve BERG by at least 3 points in order to demonstrate clinically significant improvement in balance.   Baseline: 02/13/24: To be completed on visit # 2.     02/15/24: 37/56 Goal status: INITIAL  2.   Pt will improve ABC by at least 13% in order to demonstrate clinically significant improvement in balance confidence.      Baseline: 02/13/24: 73.1% Goal status: INITIAL  3. Pt will improve DGI by at least 3 points in order to demonstrate clinically significant improvement in balance and decreased risk for falls.     Baseline: 02/13/24: To be completed on visit #2.     02/15/24: 11/24 Goal status: INITIAL  4. Pt will decrease TUG to below 14 seconds/decrease in order to demonstrate decreased fall risk.  Baseline: 02/13/24: 21.2 sec Goal status: INITIAL    PLAN: PT FREQUENCY: 2x/week  PT DURATION: 10-12 weeks  PLANNED INTERVENTIONS: Therapeutic exercises, Therapeutic activity, Neuromuscular re-education, Balance training, Gait training, Patient/Family education, Joint manipulation, Joint mobilization, Canalith repositioning, Aquatic Therapy, Dry Needling, Cognitive remediation, Electrical stimulation, Spinal manipulation, Spinal mobilization, Cryotherapy, Moist heat, Traction, Ultrasound, Ionotophoresis 4mg /ml Dexamethasone , and Manual therapy  PLAN FOR NEXT SESSION: Weight shifting and postural re-training/positional vertical training. LE strengthening/gluteus medius strengthening.    Venetia Endo, PT, DPT #[id number]  Venetia ONEIDA Endo 02/22/2024, 12:34 PM

## 2024-02-27 ENCOUNTER — Ambulatory Visit: Admitting: Physical Therapy

## 2024-02-27 DIAGNOSIS — R2681 Unsteadiness on feet: Secondary | ICD-10-CM

## 2024-02-27 DIAGNOSIS — R262 Difficulty in walking, not elsewhere classified: Secondary | ICD-10-CM

## 2024-02-27 DIAGNOSIS — M6281 Muscle weakness (generalized): Secondary | ICD-10-CM

## 2024-02-27 NOTE — Therapy (Signed)
 OUTPATIENT PHYSICAL THERAPY BALANCE TREATMENT   Patient Name: Lance [name] MRN: [id number] DOB:08-14-1950, 73 y.o., male Today's Date: 02/27/2024   PT End of Session - 02/27/24 1033     Visit Number 5    Number of Visits 21    Date for Recertification  04/23/24    Authorization Type Medicare A&B    PT Start Time 1029    PT Stop Time 1112    PT Time Calculation (min) 43 min    Equipment Utilized During Treatment Gait belt    Behavior During Therapy WFL for tasks assessed/performed             Past Medical History:  Diagnosis Date   Acute cystitis    Allergic rhinitis    BPH (benign prostatic hyperplasia)    Bronchitis    chronic   Carpal tunnel syndrome    Complication of anesthesia    delayed emergence after fatty tumor surgery   DDD (degenerative disc disease), lumbar    whole body, joints   Deficiency, protein S    Degenerative lumbar spinal stenosis    chronic back pain, left leg and foot   Diabetes mellitus (HCC)    diet controlled   DISH (diffuse idiopathic skeletal hyperostosis)    neck pain   Dyspnea    with exertion, wheezing occasionally   Erectile dysfunction    Factor V Leiden    GERD (gastroesophageal reflux disease)    Gout    Headache    migraines-occasionally   History of DVT (deep vein thrombosis)    left leg-after left foot surgery   History of migraine headaches    History of pulmonary embolism    bilateral   HOH (hard of hearing)    wears aids   Hypercholesterolemia    Hypercoagulable state    Factor V   Hypertension    controlled on meds   Hypogonadism in male    Infection of skin    Iron deficiency anemia    Lower urinary tract symptoms    Meralgia paresthetica    Neck pain    Obesity    OSA on CPAP    Post-void dribbling    Prostatitis    Pulmonary embolism (HCC)    after carpal tunnel surgery   SNHL (sensorineural hearing loss)    Vertigo    occasional   Past Surgical History:  Procedure Laterality Date    Blood vessel Surgery     CARPAL TUNNEL RELEASE Bilateral 2011   CATARACT EXTRACTION W/PHACO Right 10/17/2017   Procedure: CATARACT EXTRACTION PHACO AND INTRAOCULAR LENS PLACEMENT (IOC)  RIGHT DIABETIC;  Surgeon: Mittie Gaskin, MD;  Location: [facility];  Service: Ophthalmology;  Laterality: Right;  CPAP Diet controlled diabetic   CATARACT EXTRACTION W/PHACO Left 11/23/2017   Procedure: CATARACT EXTRACTION PHACO AND INTRAOCULAR LENS PLACEMENT (IOC)  LEFT DIABETIC;  Surgeon: Mittie Gaskin, MD;  Location: [facility];  Service: Ophthalmology;  Laterality: Left;  Diabetic  sleep apnea   COLONOSCOPY WITH PROPOFOL  N/A 07/11/2015   Procedure: COLONOSCOPY WITH PROPOFOL ;  Surgeon: Gladis RAYMOND Mariner, MD;  Location: [facility];  Service: Endoscopy;  Laterality: N/A;   COLONOSCOPY WITH PROPOFOL  N/A 02/02/2019   Procedure: COLONOSCOPY WITH PROPOFOL ;  Surgeon: Mariner Gladis RAYMOND, MD;  Location: [facility] ENDOSCOPY;  Service: Endoscopy;  Laterality: N/A;   FOOT SURGERY Bilateral    FOOT SURGERY Left 2003   complicated by DVT   HEMORRHOID SURGERY  1984   HERNIA REPAIR Bilateral 1985  x2   HOLEP-LASER ENUCLEATION OF THE PROSTATE WITH MORCELLATION  08/28/2014   Dr. Rosina Riis   HYDROCELE EXCISION / REPAIR  1985   x2   Hydrocelectomy     x 2   INNER EAR SURGERY     INSERTION OF MESH Right 10/01/2022   Procedure: INSERTION OF MESH;  Surgeon: Tye Millet, DO;  Location: [facility] ORS;  Service: General;  Laterality: Right;   IVC FILTER INSERTION N/A 07/14/2023   Procedure: IVC FILTER INSERTION;  Surgeon: Marea Selinda RAMAN, MD;  Location: [facility] INVASIVE CV LAB;  Service: Cardiovascular;  Laterality: N/A;   JOINT REPLACEMENT     left knee, right shoulder   LEG SURGERY Right 1974   fatty tumor   PROSTATE SURGERY     REPLACEMENT TOTAL KNEE Left 2012   SHOULDER SURGERY Right 1968   TONSILLECTOMY  1958   TOTAL HIP ARTHROPLASTY Left 07/18/2023   Procedure: ARTHROPLASTY, HIP, TOTAL,  ANTERIOR APPROACH;  Surgeon: Lorelle Hussar, MD;  Location: [facility] OR[facility];  Service: Orthopedics;  Laterality: Left;   TOTAL SHOULDER ARTHROPLASTY Right 09/18/2014   arthroplasty, glenohumeral joint; total shoulder (glenoid and prosimal humeral replacement)   TUMOR EXCISION Right    fatty tumor   VASECTOMY  1979   Patient Active Problem List   Diagnosis Date Noted   S/P total left hip arthroplasty 07/18/2023   Lymphedema 07/05/2023   Vitamin D deficiency 04/18/2021   History of DVT (deep vein thrombosis) 04/09/2020   History of pulmonary embolus (PE) 04/09/2020   Infection of prosthetic shoulder joint 04/09/2020   Pain syndrome, chronic 04/09/2020   History of CHF (congestive heart failure) 03/17/2020   Benign prostatic hyperplasia 10/02/2019   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 05/16/2019   Orthostasis 05/16/2019   Gout 12/28/2017   History of migraine headaches 12/28/2017   High risk medication use 04/22/2017   Primary osteoarthritis of right knee 04/11/2017   Testosterone  deficiency 01/18/2017   BMI 40.0-44.9, adult (HCC) 10/28/2016   Chronic use of opiate drug for therapeutic purpose 05/06/2016   Erectile dysfunction of organic origin 07/24/2015   History of adenomatous polyp of colon 07/11/2015   BPH with obstruction/lower urinary tract symptoms 04/10/2015   Hypogonadism in male 01/07/2015   Hammer toe of right foot 12/24/2014   Venous insufficiency 12/24/2014   H/O total knee replacement, left 12/12/2014   Deficiency, protein S 09/19/2014   S/P shoulder replacement, right 09/18/2014   Degenerative lumbar spinal stenosis 06/26/2014   Lumbar radiculitis 02/08/2014   Allergic rhinitis 10/16/2013   DDD (degenerative disc disease), lumbar 10/16/2013   Diabetes mellitus (HCC) 10/16/2013   Hypercholesterolemia 10/16/2013   Iron deficiency anemia due to chronic blood loss 10/16/2013   Long term current use of anticoagulant therapy 10/16/2013   Venous  stasis dermatitis of both lower extremities 10/16/2013   Type 2 diabetes mellitus with other specified complication (HCC) 10/16/2013   Arthritis of knee 08/18/2011   Low back pain 07/05/2011   Primary localized osteoarthrosis, lower leg 08/26/2010   Essential hypertension 12/02/2009   Gastro-esophageal reflux disease without esophagitis 12/02/2009   Obstructive sleep apnea 12/02/2009   Shortness of breath 12/02/2009   Pulmonary embolism and infarction (HCC) 12/02/2009    PCP: Clotilda VEAR Leaven, NP  REFERRING PROVIDER: Lauraine Lamarr Leak, NP  REFERRING DIAGNOSIS:  R29.6 (ICD-10-CM) - Repeated falls  R26.81 (ICD-10-CM) - Unsteadiness on feet   THERAPY DIAG: Unsteadiness on feet  Difficulty in walking, not elsewhere classified  Muscle weakness (generalized)  ONSET DATE: Got worse over last year  FOLLOW UP APPT WITH PROVIDER: None currently in EMR   RATIONALE FOR EVALUATION AND TREATMENT: Rehabilitation  PERTINENT HISTORY: Pt is a 73 year old male with referral to PT for repeated falls and unsteadiness on feet.   Pt reports he has fallen up to 9-10 times over last year. Patient reports one episode of passing out and falling due to this. Pt had notable head trauma with one fall hitting edge of table. Pt had L THA in March 2025. Pt believes that last two falls have been due to dehydration. He was taking diuretic medication - pt quit taking this medication and MD is aware of this. Hx of L ankle fusion (specific bones unspecified). Hx of L TKA. Hx of R TSA. Pt is on permanent blood thinners with hx of DVT/PE.   Pain: No Numbness/Tingling: Yes; Hx of neuropathy affecting LEs Focal Weakness: Yes; L thigh gets very fatigued and wants to give way Recent changes in overall health/medication: Yes; medication updated for DM management Prior history of physical therapy for balance:  Yes; pt had PT at [facility] several times for balance  Falls: Has patient fallen in last 6 months? Yes,  Number of falls: 3-4 Directional pattern for falls: No, variety of scenarios, intermittent stumbling on steps and with transfers    Imaging: Yes (C-spine imaging after head trauma with arm weakness and lightheadedness)  MRI for C-spine in August 2025 IMPRESSION: 1. No acute findings or clear explanation for the patient's symptoms. 2. Mildly progressive multilevel cervical spondylosis compared with previous MRI from 2014. There is mild spinal stenosis at C3-4 and C4-5 without cord deformity or abnormal cord signal. 3. Moderate to severe foraminal narrowing bilaterally at C3-4 and C4-5 appears progressive and could contribute to the patient's upper extremity symptoms. 4. 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   Red flags (bowel/bladder changes, saddle paresthesia, personal history of cancer, h/o spinal tumors, h/o compression fx, h/o abdominal aneurysm, abdominal pain, chills/fever, night sweats, nausea, vomiting, unrelenting pain): Negative  Precautions: Fall history, fall risk   Weight Bearing Restrictions: No  Living Environment Lives with: lives with their spouse; daughter and her family live close to pt; other daughter is 15 min away Lives in: House/apartment  -Pt usually uses deck steps; 2 steps followed by flat and then additional 2 steps; pt grabs aluminum post to get into home from steps  Has following equipment at home: Single point cane, Walker - 2 wheeled, Crutches, and bed side commode   Patient Goals: Not having falls    OBJECTIVE (data from initial evaluation unless otherwise dated):   Patient Surveys  ABC: 73.1 %  GAIT: Distance walked: 80 ft Assistive device utilized: Single point cane Level of assistance: CGA Comments: L ankle overpronation/medial arch  collapse and calcaneal valgus, L lateral shift, decreased L knee extension at terminal swing, decreased step cadence  Posture: Sacral sitting posture and kyphotic self-selected posture. L lateral shift and moderate rounded shoulders in standing  AROM  AROM (Normal range in degrees) AROM  02/13/2024      Knee    Flexion (135)    Extension (0)        Ankle    Dorsiflexion (20) Mod motion loss Mod motion loss  Plantarflexion (50) WNL WNL  Inversion (35)    Eversion (15    (* =  pain; Blank rows = not tested)   LE MMT:  MMT (out of 5) Right 02/13/2024 Left 02/13/2024  Hip flexion 4 3+  Hip extension    Hip abduction  Seated 4 Sidelying 4- Seated 4 Unable to assess sidelying  Hip adduction 5 5  Hip internal rotation    Hip external rotation    Knee flexion    Knee extension 5 5  Ankle dorsiflexion 4- 4  Ankle plantarflexion    Ankle inversion    Ankle eversion    (* = pain; Blank rows = not tested)   Sensation Diminished light touch sensation in stocking-glove distribution for bilateral feet/toes distal to malleoli. Proprioception, and hot/cold testing deferred on this date.   Coordination/Cerebellar Finger to Nose: WNL Heel to Shin: WNL Rapid alternating movements: WNL Finger Opposition: WNL Pronator Drift: Negative   FUNCTIONAL OUTCOME MEASURES   Results Comments  BERG 37/56 Fall risk, in need of intervention  DGI 11/24   TUG 21.2 seconds   5TSTS Unable to complete sit to stand   (Blank rows = not tested)   POSTURAL CONTROL TESTING  Romberg: EO WNL, EC 5 sec on first attempted (30 sec with follow-up attempt)  Clinical Test of Sensory Interaction for Balance    (CTSIB):  CONDITION TIME STRATEGY SWAY  Eyes open, firm surface 30 seconds ankle   Eyes closed, firm surface 30 seconds ankle   Eyes open, foam surface 30 seconds ankle   Eyes closed, foam surface 30 seconds ankle       TODAY'S TREATMENT   02/27/2024   SUBJECTIVE STATEMENT:   Patient  reports having challenges with bladder control over the weekend; this resolved with BM later during the weekend. He feels that bladder incontinence issue got better after BM. Pt has hx of prostatectomy. Pt has been unable to participate with HEP due to this issue. Pt has lumbar spine ESI with Dr. Avanell tomorrow. No groin/saddle paresthesia. Pt reports no dramatic increase in severity of back pain - he feels that it has been better recently.    Therapeutic Exercise - improved strength as needed to improve performance of CKC activities/functional movements and as needed for power production to prevent fall during episode of large postural perturbation  NuStep; Level 5, x 5 minutes - for improved soft tissue mobility and increased tissue temperature to improve muscle performance   -subjective gathered during this time  Sidestepping along // bars with Green Tband tied just above knees; 5x D/B  PATIENT EDUCATION: Recommended f/u with urology if bladder incontinence re-occurs. Discussed potential contributing factors.    *next visit* Sit to stand; from raised table; 2 x 10 SLR; 2 x 10 ea LE Bridge; 2 x 10   *not today*  Standing hip abduction; 2 x 10 on either side, adjacent to treadmill handrail for UE support Standing Heel raise/Toe raise; 2 x 10 alt, adjacent to treadmill handrail    Neuromuscular Re-education - for improved sensory integration, static and dynamic postural control, equilibrium and non-equilibrium coordination as needed for negotiating home and community environment and stepping over obstacles  Toe tapping, 6-inch step; 2 x 10 alt, with SPC L hand  Standing hip hike; x 20 with focus on hiking L hip, BUE support on handrails (base of staircase)  Standing feet together at handrail, on Airex; 2 x 30 sec  -mirror feedback for maintaining upright posture  Staggered stance; R and LLE 2 x 30 sec with adjacent treadmill handrail  -mirror feedback for maintaining upright  posture    PATIENT EDUCATION:  Education details: see above for patient education details Person educated: Patient Education method: Medical illustrator Education comprehension: verbalized understanding   HOME EXERCISE PROGRAM: Access Code: [email or web address] URL: [email or web address] Date: 02/22/2024 Prepared by: Venetia Endo  Exercises - Sit to Stand Without Arm Support  - 1-2 x daily - 7 x weekly - 2 sets - 10 reps - Standing Hip Abduction with Counter Support  - 1-2 x daily - 7 x weekly - 2 sets - 10 reps - Heel Toe Raises with Counter Support  - 1-2 x daily - 7 x weekly - 2-3 sets - 10 reps - Supine Active Straight Leg Raise  - 2 x daily - 7 x weekly - 2 sets - 10 reps - Supine Bridge  - 1 x daily - 7 x weekly - 2-3 sets - 5-6 reps   ASSESSMENT:  CLINICAL IMPRESSION: Patient had recent issue with acute bladder incontinence over the weekend. He has comorbid lumbar spinal stenosis and chronic back pain; pt reports no exacerbation of back symptoms, and no saddle paresthesia or nocturnal symptoms. Pt states this improved during later part of weekend following BM. Today, we suggested following up with urology if this re-occurs. We continued with weight shifting drills and postural re-training drills with mirror feedback. Pt has significant L lateral shift and L pelvic drop. Patient will benefit from skilled PT to address above impairments and improve overall function.  REHAB POTENTIAL: Good  CLINICAL DECISION MAKING: Unstable/unpredictable  EVALUATION COMPLEXITY: High   GOALS: Goals reviewed with patient? No  SHORT TERM GOALS: Target date: 03/12/2024  Pt will be independent with HEP in order to improve strength and balance in order to decrease fall risk and improve function at home. Baseline: 02/13/24: Baseline HEP to be provided and reviewed on visit #2.    02/15/24: Baseline HEP initiated Goal status: INITIAL  By 4 weeks, pt will complete sit to stand  without UE support indicative of improved upper limb strength/power to prevent falls and improved home-level functional mobility Baseline: 02/13/24: Heavy upper limb assist with sit to stand.  Goal status: INITIAL  LONG TERM GOALS: Target date: 04/19/2024   1.  Pt will improve BERG by at least 3 points in order to demonstrate clinically significant improvement in balance.   Baseline: 02/13/24: To be completed on visit # 2.     02/15/24: 37/56 Goal status: INITIAL  2.  Pt will improve ABC by at least 13% in order to demonstrate clinically significant improvement in balance confidence.      Baseline: 02/13/24: 73.1% Goal status: INITIAL  3. Pt will improve DGI by at least 3 points in order to demonstrate clinically significant improvement in balance and decreased risk for falls.     Baseline: 02/13/24: To be completed on visit #2.     02/15/24: 11/24 Goal status: INITIAL  4. Pt will decrease TUG to below 14 seconds/decrease in order to demonstrate decreased fall risk.  Baseline: 02/13/24: 21.2 sec Goal status: INITIAL    PLAN: PT FREQUENCY: 2x/week  PT DURATION: 10-12 weeks  PLANNED INTERVENTIONS: Therapeutic exercises, Therapeutic activity, Neuromuscular re-education, Balance training, Gait training, Patient/Family education, Joint manipulation, Joint mobilization, Canalith repositioning, Aquatic Therapy, Dry Needling, Cognitive remediation, Electrical stimulation, Spinal manipulation, Spinal mobilization, Cryotherapy, Moist heat, Traction, Ultrasound, Ionotophoresis 4mg /ml Dexamethasone , and Manual therapy  PLAN FOR NEXT SESSION: Weight shifting and postural re-training/positional vertical training. LE strengthening/gluteus medius strengthening.    Venetia Endo, PT, DPT [id number]  Venetia  ONEIDA Endo 02/27/2024, 10:34 AM

## 2024-02-29 ENCOUNTER — Ambulatory Visit: Admitting: Physical Therapy

## 2024-03-05 ENCOUNTER — Encounter: Payer: Self-pay | Admitting: Physical Therapy

## 2024-03-05 ENCOUNTER — Ambulatory Visit: Admitting: Physical Therapy

## 2024-03-05 DIAGNOSIS — M6281 Muscle weakness (generalized): Secondary | ICD-10-CM

## 2024-03-05 DIAGNOSIS — R2681 Unsteadiness on feet: Secondary | ICD-10-CM | POA: Diagnosis not present

## 2024-03-05 DIAGNOSIS — R262 Difficulty in walking, not elsewhere classified: Secondary | ICD-10-CM

## 2024-03-05 NOTE — Therapy (Signed)
 OUTPATIENT PHYSICAL THERAPY BALANCE TREATMENT   Patient Name: Lance [name] MRN: [id number] DOB:1950-10-03, 73 y.o., male Today's Date: 03/05/2024   PT End of Session - 03/05/24 1032     Visit Number 6    Number of Visits 21    Date for Recertification  04/23/24    Authorization Type Medicare A&B    PT Start Time 1035    PT Stop Time 1115    PT Time Calculation (min) 40 min    Equipment Utilized During Treatment Gait belt    Behavior During Therapy WFL for tasks assessed/performed              Past Medical History:  Diagnosis Date   Acute cystitis    Allergic rhinitis    BPH (benign prostatic hyperplasia)    Bronchitis    chronic   Carpal tunnel syndrome    Complication of anesthesia    delayed emergence after fatty tumor surgery   DDD (degenerative disc disease), lumbar    whole body, joints   Deficiency, protein S    Degenerative lumbar spinal stenosis    chronic back pain, left leg and foot   Diabetes mellitus (HCC)    diet controlled   DISH (diffuse idiopathic skeletal hyperostosis)    neck pain   Dyspnea    with exertion, wheezing occasionally   Erectile dysfunction    Factor V Leiden    GERD (gastroesophageal reflux disease)    Gout    Headache    migraines-occasionally   History of DVT (deep vein thrombosis)    left leg-after left foot surgery   History of migraine headaches    History of pulmonary embolism    bilateral   HOH (hard of hearing)    wears aids   Hypercholesterolemia    Hypercoagulable state    Factor V   Hypertension    controlled on meds   Hypogonadism in male    Infection of skin    Iron deficiency anemia    Lower urinary tract symptoms    Meralgia paresthetica    Neck pain    Obesity    OSA on CPAP    Post-void dribbling    Prostatitis    Pulmonary embolism (HCC)    after carpal tunnel surgery   SNHL (sensorineural hearing loss)    Vertigo    occasional   Past Surgical History:  Procedure Laterality Date    Blood vessel Surgery     CARPAL TUNNEL RELEASE Bilateral 2011   CATARACT EXTRACTION W/PHACO Right 10/17/2017   Procedure: CATARACT EXTRACTION PHACO AND INTRAOCULAR LENS PLACEMENT (IOC)  RIGHT DIABETIC;  Surgeon: Mittie Gaskin, MD;  Location: [facility];  Service: Ophthalmology;  Laterality: Right;  CPAP Diet controlled diabetic   CATARACT EXTRACTION W/PHACO Left 11/23/2017   Procedure: CATARACT EXTRACTION PHACO AND INTRAOCULAR LENS PLACEMENT (IOC)  LEFT DIABETIC;  Surgeon: Mittie Gaskin, MD;  Location: [facility];  Service: Ophthalmology;  Laterality: Left;  Diabetic  sleep apnea   COLONOSCOPY WITH PROPOFOL  N/A 07/11/2015   Procedure: COLONOSCOPY WITH PROPOFOL ;  Surgeon: Gladis RAYMOND Mariner, MD;  Location: [facility];  Service: Endoscopy;  Laterality: N/A;   COLONOSCOPY WITH PROPOFOL  N/A 02/02/2019   Procedure: COLONOSCOPY WITH PROPOFOL ;  Surgeon: Mariner Gladis RAYMOND, MD;  Location: [facility] ENDOSCOPY;  Service: Endoscopy;  Laterality: N/A;   FOOT SURGERY Bilateral    FOOT SURGERY Left 2003   complicated by DVT   HEMORRHOID SURGERY  1984   HERNIA REPAIR Bilateral  1985   x2   HOLEP-LASER ENUCLEATION OF THE PROSTATE WITH MORCELLATION  08/28/2014   Dr. Rosina Riis   HYDROCELE EXCISION / REPAIR  1985   x2   Hydrocelectomy     x 2   INNER EAR SURGERY     INSERTION OF MESH Right 10/01/2022   Procedure: INSERTION OF MESH;  Surgeon: Tye Millet, DO;  Location: [facility];  Service: General;  Laterality: Right;   IVC FILTER INSERTION N/A 07/14/2023   Procedure: IVC FILTER INSERTION;  Surgeon: Marea Selinda RAMAN, MD;  Location: [facility];  Service: Cardiovascular;  Laterality: N/A;   JOINT REPLACEMENT     left knee, right shoulder   LEG SURGERY Right 1974   fatty tumor   PROSTATE SURGERY     REPLACEMENT TOTAL KNEE Left 2012   SHOULDER SURGERY Right 1968   TONSILLECTOMY  1958   TOTAL HIP ARTHROPLASTY Left 07/18/2023   Procedure: ARTHROPLASTY, HIP, TOTAL,  ANTERIOR APPROACH;  Surgeon: Lorelle Hussar, MD;  Location: [facility];  Service: Orthopedics;  Laterality: Left;   TOTAL SHOULDER ARTHROPLASTY Right 09/18/2014   arthroplasty, glenohumeral joint; total shoulder (glenoid and prosimal humeral replacement)   TUMOR EXCISION Right    fatty tumor   VASECTOMY  1979   Patient Active Problem List   Diagnosis Date Noted   S/P total left hip arthroplasty 07/18/2023   Lymphedema 07/05/2023   Vitamin D deficiency 04/18/2021   History of DVT (deep vein thrombosis) 04/09/2020   History of pulmonary embolus (PE) 04/09/2020   Infection of prosthetic shoulder joint 04/09/2020   Pain syndrome, chronic 04/09/2020   History of CHF (congestive heart failure) 03/17/2020   Benign prostatic hyperplasia 10/02/2019   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 05/16/2019   Orthostasis 05/16/2019   Gout 12/28/2017   History of migraine headaches 12/28/2017   High risk medication use 04/22/2017   Primary osteoarthritis of right knee 04/11/2017   Testosterone  deficiency 01/18/2017   BMI 40.0-44.9, adult (HCC) 10/28/2016   Chronic use of opiate drug for therapeutic purpose 05/06/2016   Erectile dysfunction of organic origin 07/24/2015   History of adenomatous polyp of colon 07/11/2015   BPH with obstruction/lower urinary tract symptoms 04/10/2015   Hypogonadism in male 01/07/2015   Hammer toe of right foot 12/24/2014   Venous insufficiency 12/24/2014   H/O total knee replacement, left 12/12/2014   Deficiency, protein S 09/19/2014   S/P shoulder replacement, right 09/18/2014   Degenerative lumbar spinal stenosis 06/26/2014   Lumbar radiculitis 02/08/2014   Allergic rhinitis 10/16/2013   DDD (degenerative disc disease), lumbar 10/16/2013   Diabetes mellitus (HCC) 10/16/2013   Hypercholesterolemia 10/16/2013   Iron deficiency anemia due to chronic blood loss 10/16/2013   Long term current use of anticoagulant therapy 10/16/2013   Venous  stasis dermatitis of both lower extremities 10/16/2013   Type 2 diabetes mellitus with other specified complication (HCC) 10/16/2013   Arthritis of knee 08/18/2011   Low back pain 07/05/2011   Primary localized osteoarthrosis, lower leg 08/26/2010   Essential hypertension 12/02/2009   Gastro-esophageal reflux disease without esophagitis 12/02/2009   Obstructive sleep apnea 12/02/2009   Shortness of breath 12/02/2009   Pulmonary embolism and infarction (HCC) 12/02/2009    PCP: Clotilda VEAR Leaven, NP  REFERRING PROVIDER: Lauraine Lamarr Leak, NP  REFERRING DIAGNOSIS:  R29.6 (ICD-10-CM) - Repeated falls  R26.81 (ICD-10-CM) - Unsteadiness on feet   THERAPY DIAG: Unsteadiness on feet  Muscle weakness (generalized)  Difficulty in walking, not  elsewhere classified  ONSET DATE: Got worse over last year  FOLLOW UP APPT WITH PROVIDER: None currently in EMR   RATIONALE FOR EVALUATION AND TREATMENT: Rehabilitation  PERTINENT HISTORY: Pt is a 73 year old male with referral to PT for repeated falls and unsteadiness on feet.   Pt reports he has fallen up to 9-10 times over last year. Patient reports one episode of passing out and falling due to this. Pt had notable head trauma with one fall hitting edge of table. Pt had L THA in March 2025. Pt believes that last two falls have been due to dehydration. He was taking diuretic medication - pt quit taking this medication and MD is aware of this. Hx of L ankle fusion (specific bones unspecified). Hx of L TKA. Hx of R TSA. Pt is on permanent blood thinners with hx of DVT/PE.   Pain: No Numbness/Tingling: Yes; Hx of neuropathy affecting LEs Focal Weakness: Yes; L thigh gets very fatigued and wants to give way Recent changes in overall health/medication: Yes; medication updated for DM management Prior history of physical therapy for balance:  Yes; pt had PT at [facility] several times for balance  Falls: Has patient fallen in last 6 months? Yes,  Number of falls: 3-4 Directional pattern for falls: No, variety of scenarios, intermittent stumbling on steps and with transfers    Imaging: Yes (C-spine imaging after head trauma with arm weakness and lightheadedness)  MRI for C-spine in August 2025 IMPRESSION: 1. No acute findings or clear explanation for the patient's symptoms. 2. Mildly progressive multilevel cervical spondylosis compared with previous MRI from 2014. There is mild spinal stenosis at C3-4 and C4-5 without cord deformity or abnormal cord signal. 3. Moderate to severe foraminal narrowing bilaterally at C3-4 and C4-5 appears progressive and could contribute to the patient's upper extremity symptoms. 4. 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   Prior level of function: Independent with community mobility with device Occupational demands: Retired Presenter, [employer]: Going to [employer] sporting events   Red flags (bowel/bladder changes, saddle paresthesia, personal history of cancer, h/o spinal tumors, h/o compression fx, h/o abdominal aneurysm, abdominal pain, chills/fever, night sweats, nausea, vomiting, unrelenting pain): Negative  Precautions: Fall history, fall risk   Weight Bearing Restrictions: No  Living Environment Lives with: lives with their spouse; daughter and her family live close to pt; other daughter is 15 min away Lives in: House/apartment  -Pt usually uses deck steps; 2 steps followed by flat and then additional 2 steps; pt grabs aluminum post to get into home from steps  Has following equipment at home: Single point cane, Walker - 2 wheeled, Crutches, and bed side commode   Patient Goals: Not having falls    OBJECTIVE (data from initial evaluation unless otherwise dated):   Patient Surveys  ABC: 73.1 %  GAIT: Distance walked: 80 ft Assistive device utilized: Single point cane Level of assistance: CGA Comments: L ankle overpronation/medial arch  collapse and calcaneal valgus, L lateral shift, decreased L knee extension at terminal swing, decreased step cadence  Posture: Sacral sitting posture and kyphotic self-selected posture. L lateral shift and moderate rounded shoulders in standing  AROM  AROM (Normal range in degrees) AROM  02/13/2024      Knee    Flexion (135)    Extension (0)        Ankle    Dorsiflexion (20) Mod motion loss Mod motion loss  Plantarflexion (50) WNL WNL  Inversion (35)    Eversion (  15    (* = pain; Blank rows = not tested)   LE MMT:  MMT (out of 5) Right 02/13/2024 Left 02/13/2024  Hip flexion 4 3+  Hip extension    Hip abduction  Seated 4 Sidelying 4- Seated 4 Unable to assess sidelying  Hip adduction 5 5  Hip internal rotation    Hip external rotation    Knee flexion    Knee extension 5 5  Ankle dorsiflexion 4- 4  Ankle plantarflexion    Ankle inversion    Ankle eversion    (* = pain; Blank rows = not tested)   Sensation Diminished light touch sensation in stocking-glove distribution for bilateral feet/toes distal to malleoli. Proprioception, and hot/cold testing deferred on this date.   Coordination/Cerebellar Finger to Nose: WNL Heel to Shin: WNL Rapid alternating movements: WNL Finger Opposition: WNL Pronator Drift: Negative   FUNCTIONAL OUTCOME MEASURES   Results Comments  BERG 37/56 Fall risk, in need of intervention  DGI 11/24   TUG 21.2 seconds   5TSTS Unable to complete sit to stand   (Blank rows = not tested)   POSTURAL CONTROL TESTING  Romberg: EO WNL, EC 5 sec on first attempted (30 sec with follow-up attempt)  Clinical Test of Sensory Interaction for Balance    (CTSIB):  CONDITION TIME STRATEGY SWAY  Eyes open, firm surface 30 seconds ankle   Eyes closed, firm surface 30 seconds ankle   Eyes open, foam surface 30 seconds ankle   Eyes closed, foam surface 30 seconds ankle       TODAY'S TREATMENT   03/05/2024   SUBJECTIVE STATEMENT:   Patient  reports no notable near-falls or safety incidents since last visit. Patient reports no further significant incontinence issues. He had ESI last week and had to hold on 2nd appointment last week due to this; pt instructed to hold on notable exercise until today.    Therapeutic Exercise - improved strength as needed to improve performance of CKC activities/functional movements and as needed for power production to prevent fall during episode of large postural perturbation  NuStep; Level 5, x 6 minutes - for improved soft tissue mobility and increased tissue temperature to improve muscle performance   -subjective gathered during this time  Sidestepping along // bars with Landy Tband tied just above knees; 5x D/B  Sit to stand; from raised table; 2 x 10 SLR; 2 x 10 ea LE   PATIENT EDUCATION: Recommended f/u with urology if bladder incontinence re-occurs. Discussed potential contributing factors.     *not today*  Bridge; 2 x 10  Standing hip abduction; 2 x 10 on either side, adjacent to treadmill handrail for UE support Standing Heel raise/Toe raise; 2 x 10 alt, adjacent to treadmill handrail    Neuromuscular Re-education - for improved sensory integration, static and dynamic postural control, equilibrium and non-equilibrium coordination as needed for negotiating home and community environment and stepping over obstacles  Dynamic march, intermittent UE touch on // bars; 4x D/B  Toe tapping, 6-inch step; 2 x 10 alt, with SPC L hand   Staggered stance; R and LLE 2 x 30 sec with adjacent treadmill handrail  -mirror feedback for maintaining upright posture   *not today* Standing hip hike; x 20 with focus on hiking L hip, BUE support on handrails (base of staircase) Standing feet together at handrail, on Airex; 2 x 30 sec  -mirror feedback for maintaining upright posture   PATIENT EDUCATION:  Education details: see above for patient  education details Person educated:  Patient Education method: Medical Illustrator Education comprehension: verbalized understanding   HOME EXERCISE PROGRAM: Access Code: [email or web address] URL: [email or web address] Date: 02/22/2024 Prepared by: Venetia Endo  Exercises - Sit to Stand Without Arm Support  - 1-2 x daily - 7 x weekly - 2 sets - 10 reps - Standing Hip Abduction with Counter Support  - 1-2 x daily - 7 x weekly - 2 sets - 10 reps - Heel Toe Raises with Counter Support  - 1-2 x daily - 7 x weekly - 2-3 sets - 10 reps - Supine Active Straight Leg Raise  - 2 x daily - 7 x weekly - 2 sets - 10 reps - Supine Bridge  - 1 x daily - 7 x weekly - 2-3 sets - 5-6 reps   ASSESSMENT:  CLINICAL IMPRESSION: Patient reports no further re-occurrence of bladder incontinence. Patient had ESI last week, and he feels this is helping with L lumbosacral pain. Pt has chronic postural changes and notable L dynanc valgus/overpronation that can lead to frontal plane gait instability. Pt exhibits improving ability to perform sit to stand from standard-height seat. We continued with weight shifting drills and postural re-training drills with mirror feedback. Pt has significant L lateral shift and L pelvic drop. Patient will benefit from skilled PT to address above impairments and improve overall function.  REHAB POTENTIAL: Good  CLINICAL DECISION MAKING: Unstable/unpredictable  EVALUATION COMPLEXITY: High   GOALS: Goals reviewed with patient? No  SHORT TERM GOALS: Target date: 03/12/2024  Pt will be independent with HEP in order to improve strength and balance in order to decrease fall risk and improve function at home. Baseline: 02/13/24: Baseline HEP to be provided and reviewed on visit #2.    02/15/24: Baseline HEP initiated Goal status: INITIAL  By 4 weeks, pt will complete sit to stand without UE support indicative of improved upper limb strength/power to prevent falls and improved home-level functional  mobility Baseline: 02/13/24: Heavy upper limb assist with sit to stand.  Goal status: INITIAL  LONG TERM GOALS: Target date: 04/19/2024   1.  Pt will improve BERG by at least 3 points in order to demonstrate clinically significant improvement in balance.   Baseline: 02/13/24: To be completed on visit # 2.     02/15/24: 37/56 Goal status: INITIAL  2.  Pt will improve ABC by at least 13% in order to demonstrate clinically significant improvement in balance confidence.      Baseline: 02/13/24: 73.1% Goal status: INITIAL  3. Pt will improve DGI by at least 3 points in order to demonstrate clinically significant improvement in balance and decreased risk for falls.     Baseline: 02/13/24: To be completed on visit #2.     02/15/24: 11/24 Goal status: INITIAL  4. Pt will decrease TUG to below 14 seconds/decrease in order to demonstrate decreased fall risk.  Baseline: 02/13/24: 21.2 sec Goal status: INITIAL    PLAN: PT FREQUENCY: 2x/week  PT DURATION: 10-12 weeks  PLANNED INTERVENTIONS: Therapeutic exercises, Therapeutic activity, Neuromuscular re-education, Balance training, Gait training, Patient/Family education, Joint manipulation, Joint mobilization, Canalith repositioning, Aquatic Therapy, Dry Needling, Cognitive remediation, Electrical stimulation, Spinal manipulation, Spinal mobilization, Cryotherapy, Moist heat, Traction, Ultrasound, Ionotophoresis 4mg /ml Dexamethasone , and Manual therapy  PLAN FOR NEXT SESSION: Weight shifting and postural re-training/positional vertical training. LE strengthening/gluteus medius strengthening.    Venetia Endo, PT, DPT #[id number]  Venetia ONEIDA Endo 03/05/2024, 10:32 AM

## 2024-03-06 ENCOUNTER — Ambulatory Visit
Admission: RE | Admit: 2024-03-06 | Discharge: 2024-03-06 | Disposition: A | Discharge status: Home / Self Care | Source: Ambulatory Visit | Attending: Surgery | Admitting: Surgery

## 2024-03-06 ENCOUNTER — Other Ambulatory Visit: Payer: Self-pay | Admitting: Surgery

## 2024-03-06 DIAGNOSIS — R1031 Right lower quadrant pain: Secondary | ICD-10-CM

## 2024-03-06 LAB — POCT I-STAT CREATININE: Creatinine, Ser: 1 mg/dL (ref 0.61–1.24)

## 2024-03-06 MED ORDER — IOHEXOL 300 MG/ML  SOLN
100.0000 mL | Freq: Once | INTRAMUSCULAR | Status: AC | PRN
  Administered 2024-03-06: 100 mL via INTRAVENOUS

## 2024-03-07 ENCOUNTER — Ambulatory Visit: Admitting: Physical Therapy

## 2024-03-12 ENCOUNTER — Ambulatory Visit: Admitting: Physical Therapy

## 2024-03-14 ENCOUNTER — Encounter: Admitting: Physical Therapy

## 2024-03-19 ENCOUNTER — Encounter: Admitting: Physical Therapy

## 2024-03-21 ENCOUNTER — Encounter: Admitting: Physical Therapy

## 2024-03-22 ENCOUNTER — Telehealth: Payer: Self-pay | Admitting: Urology

## 2024-03-22 NOTE — Telephone Encounter (Signed)
 Please schedule Lance [name] for labs and an office visit.  He had a CT by another provider which showed some abnormality in his right kidney and we need to follow up on it.

## 2024-03-26 ENCOUNTER — Encounter: Admitting: Physical Therapy

## 2024-03-26 ENCOUNTER — Other Ambulatory Visit: Payer: Self-pay

## 2024-03-26 DIAGNOSIS — R972 Elevated prostate specific antigen [PSA]: Secondary | ICD-10-CM

## 2024-03-26 DIAGNOSIS — E291 Testicular hypofunction: Secondary | ICD-10-CM

## 2024-03-27 ENCOUNTER — Other Ambulatory Visit

## 2024-03-27 DIAGNOSIS — E291 Testicular hypofunction: Secondary | ICD-10-CM

## 2024-03-27 DIAGNOSIS — R972 Elevated prostate specific antigen [PSA]: Secondary | ICD-10-CM

## 2024-03-28 ENCOUNTER — Encounter: Admitting: Physical Therapy

## 2024-03-28 LAB — TESTOSTERONE: Testosterone: 278 ng/dL (ref 264–916)

## 2024-03-28 LAB — HEMOGLOBIN AND HEMATOCRIT, BLOOD
Hematocrit: 40.2 % (ref 37.5–51.0)
Hemoglobin: 12.2 g/dL — ABNORMAL LOW (ref 13.0–17.7)

## 2024-04-02 ENCOUNTER — Encounter: Admitting: Physical Therapy

## 2024-04-02 NOTE — Progress Notes (Unsigned)
 04/03/24 1:49 PM   [name] [birth year] [id number]  Referring provider:  Steva Clotilda DEL, NP [address] [place],  [place] [address]  Urological history  1.  Hypogonadism -testosterone  level (07/2023) 672 -HCT/hemoglobin (07/2023) 12.4/41.9 -failed Testopel   -AVEED  75 mg every 10 weeks   2. BPH with LU TS -PSA pending  -s/p HoLEP 2016 -pathology of prostate chips negative   3. ED -contributing factors of age, BPH, testosterone  deficiency, DM, HTN, HLD, spinal injury, COPD, anticoagulation therapy and sleep apnea (sleeps with CPAP) -tadalafil  20 mg daily    5. Bilateral renal cysts - contrast CT 01/2021 - Bilateral renal cysts measuring up to 4 cm in the left mid kidney - stable on CT renal stone study 09/2021   6. Bladder herniation -CT renal stone study 09/2021 - right inguinal hernia containing fat and small portion of the right anterior lateral bladder wall, unchanged when compared to prior CT in 2022  Chief Complaint  Patient presents with   Hypogonadism    HPI: Lance [name] is a 73 y.o.male who presents today to for CT scan results.    Previous records reviewed.   He had a CT scan for right groin pain by general surgery and there was an incidental finding of small bilateral renal cysts and additional subcentimeter hypodense lesions which are too small to characterize. Apparent focal area of lobulation of the anterior interpolar right kidney measuring 3 x 4 cm (29/8), similar to prior CT and likely a normal morphology.  Further evaluation with ultrasound on a nonemergent/outpatient basis recommended to exclude underlying mass. There is no hydronephrosis on either side. There is symmetric enhancement and excretion of contrast by both kidneys. The visualized ureters and urinary bladder probable.  He has not had any flank pain or gross hematuria.  He did state that after his Testopel  last time, he had a brown fluid leaking from the insertion site  for the last month.  It also took a while to scab over.  It is fine now.   PMH: Past Medical History:  Diagnosis Date   Acute cystitis    Allergic rhinitis    BPH (benign prostatic hyperplasia)    Bronchitis    chronic   Carpal tunnel syndrome    Complication of anesthesia    delayed emergence after fatty tumor surgery   DDD (degenerative disc disease), lumbar    whole body, joints   Deficiency, protein S    Degenerative lumbar spinal stenosis    chronic back pain, left leg and foot   Diabetes mellitus (HCC)    diet controlled   DISH (diffuse idiopathic skeletal hyperostosis)    neck pain   Dyspnea    with exertion, wheezing occasionally   Erectile dysfunction    Factor V Leiden    GERD (gastroesophageal reflux disease)    Gout    Headache    migraines-occasionally   History of DVT (deep vein thrombosis)    left leg-after left foot surgery   History of migraine headaches    History of pulmonary embolism    bilateral   HOH (hard of hearing)    wears aids   Hypercholesterolemia    Hypercoagulable state    Factor V   Hypertension    controlled on meds   Hypogonadism in male    Infection of skin    Iron deficiency anemia    Lower urinary tract symptoms    Meralgia paresthetica    Neck pain  Obesity    OSA on CPAP    Post-void dribbling    Prostatitis    Pulmonary embolism (HCC)    after carpal tunnel surgery   SNHL (sensorineural hearing loss)    Vertigo    occasional    Surgical History: Past Surgical History:  Procedure Laterality Date   Blood vessel Surgery     CARPAL TUNNEL RELEASE Bilateral 2011   CATARACT EXTRACTION W/PHACO Right 10/17/2017   Procedure: CATARACT EXTRACTION PHACO AND INTRAOCULAR LENS PLACEMENT (IOC)  RIGHT DIABETIC;  Surgeon: Mittie Gaskin, MD;  Location: [facility];  Service: Ophthalmology;  Laterality: Right;  CPAP Diet controlled diabetic   CATARACT EXTRACTION W/PHACO Left 11/23/2017   Procedure: CATARACT  EXTRACTION PHACO AND INTRAOCULAR LENS PLACEMENT (IOC)  LEFT DIABETIC;  Surgeon: Mittie Gaskin, MD;  Location: [facility];  Service: Ophthalmology;  Laterality: Left;  Diabetic  sleep apnea   COLONOSCOPY WITH PROPOFOL  N/A 07/11/2015   Procedure: COLONOSCOPY WITH PROPOFOL ;  Surgeon: Gladis RAYMOND Mariner, MD;  Location: [facility];  Service: Endoscopy;  Laterality: N/A;   COLONOSCOPY WITH PROPOFOL  N/A 02/02/2019   Procedure: COLONOSCOPY WITH PROPOFOL ;  Surgeon: Mariner Gladis RAYMOND, MD;  Location: [facility];  Service: Endoscopy;  Laterality: N/A;   FOOT SURGERY Bilateral    FOOT SURGERY Left 2003   complicated by DVT   HEMORRHOID SURGERY  1984   HERNIA REPAIR Bilateral 1985   x2   HOLEP-LASER ENUCLEATION OF THE PROSTATE WITH MORCELLATION  08/28/2014   Dr. Rosina Riis   HYDROCELE EXCISION / REPAIR  1985   x2   Hydrocelectomy     x 2   INNER EAR SURGERY     INSERTION OF MESH Right 10/01/2022   Procedure: INSERTION OF MESH;  Surgeon: Tye Millet, DO;  Location: [facility];  Service: General;  Laterality: Right;   IVC FILTER INSERTION N/A 07/14/2023   Procedure: IVC FILTER INSERTION;  Surgeon: Marea Selinda RAMAN, MD;  Location: [facility];  Service: Cardiovascular;  Laterality: N/A;   JOINT REPLACEMENT     left knee, right shoulder   LEG SURGERY Right 1974   fatty tumor   PROSTATE SURGERY     REPLACEMENT TOTAL KNEE Left 2012   SHOULDER SURGERY Right 1968   TONSILLECTOMY  1958   TOTAL HIP ARTHROPLASTY Left 07/18/2023   Procedure: ARTHROPLASTY, HIP, TOTAL, ANTERIOR APPROACH;  Surgeon: Lorelle Hussar, MD;  Location: [facility];  Service: Orthopedics;  Laterality: Left;   TOTAL SHOULDER ARTHROPLASTY Right 09/18/2014   arthroplasty, glenohumeral joint; total shoulder (glenoid and prosimal humeral replacement)   TUMOR EXCISION Right    fatty tumor   VASECTOMY  1979    Home Medications:  Allergies as of 04/03/2024       Reactions   Penicillins    Other  reaction(s):unknown,as a child        Medication List        Accurate as of April 03, 2024  1:49 PM. If you have any questions, ask your nurse or doctor.          2-3CC SYRINGE 3 ML Misc 1 mg by Does not apply route every 14 (fourteen) days.   azelastine 0.1 % nasal spray Commonly known as: ASTELIN Place 1 spray into the nose.   BD Disp Needles 18G X 1-1/2 Misc Generic drug: NEEDLE (DISP) 18 G 1 mg by Does not apply route every 14 (fourteen) days.   BD Disp Needles 21G X 1-1/2 Misc Generic drug: NEEDLE (DISP)  21 G 1 mg by Does not apply route every 14 (fourteen) days.   buPROPion 150 MG 24 hr tablet Commonly known as: WELLBUTRIN XL Take 150 mg by mouth.   chlorhexidine  4 % external liquid Commonly known as: HIBICLENS  Apply 15 mLs (1 Application total) topically as directed for 30 doses. Use as directed daily for 5 days every other week for 6 weeks.   Cholecalciferol 125 MCG (5000 UT) Tabs Take 125 mcg by mouth. What changed: Another medication with the same name was removed. Continue taking this medication, and follow the directions you see here. Changed by: CLOTILDA CORNWALL   cyanocobalamin 1000 MCG tablet Commonly known as: VITAMIN B12 Take 1,000 mcg by mouth daily.   docusate sodium  100 MG capsule Commonly known as: COLACE Take 1 capsule (100 mg total) by mouth 2 (two) times daily.   DULoxetine  60 MG capsule Commonly known as: CYMBALTA  Take 60 mg by mouth every morning.   enoxaparin  30 MG/0.3ML injection Commonly known as: LOVENOX  Inject 0.3 mLs (30 mg total) into the skin every 12 (twelve) hours. DC Lovenox  once INR therapeutic   fluticasone 50 MCG/ACT nasal spray Commonly known as: FLONASE Place 2 sprays into both nostrils daily as needed for allergies.   gabapentin  300 MG capsule Commonly known as: NEURONTIN  Take 300 mg by mouth 4 (four) times daily.   HYDROcodone -acetaminophen  5-325 MG tablet Commonly known as: NORCO/VICODIN Take 1  tablet by mouth. What changed: Another medication with the same name was removed. Continue taking this medication, and follow the directions you see here. Changed by: CLOTILDA CORNWALL   lisinopril  5 MG tablet Commonly known as: ZESTRIL  Take 5 mg by mouth daily.   metFORMIN 500 MG 24 hr tablet Commonly known as: GLUCOPHAGE-XR Take 1,000 mg by mouth every evening.   methocarbamol 500 MG tablet Commonly known as: ROBAXIN   ondansetron  4 MG tablet Commonly known as: ZOFRAN  Take 1 tablet (4 mg total) by mouth every 6 (six) hours as needed for nausea.   pantoprazole  40 MG tablet Commonly known as: PROTONIX  Take 40 mg by mouth 2 (two) times daily.   pioglitazone 15 MG tablet Commonly known as: ACTOS Take 15 mg by mouth.   pravastatin  20 MG tablet Commonly known as: PRAVACHOL  Take 20 mg by mouth at bedtime.   predniSONE 10 MG (21) Tbpk tablet Commonly known as: STERAPRED UNI-PAK 21 TAB Take by mouth.   propranolol  ER 60 MG 24 hr capsule Commonly known as: INDERAL  LA Take 60 mg by mouth. What changed: Another medication with the same name was removed. Continue taking this medication, and follow the directions you see here. Changed by: CLOTILDA CORNWALL   tadalafil  20 MG tablet Commonly known as: CIALIS  Take 1 tablet (20 mg total) by mouth daily as needed for erectile dysfunction.   tamsulosin  0.4 MG Caps capsule Commonly known as: FLOMAX    torsemide 5 MG tablet Commonly known as: DEMADEX Take 5 mg by mouth.   triamcinolone 0.025 % cream Commonly known as: KENALOG APPLY CREAM TOPICALLY TWICE DAILY FROM KNEES DOWN   warfarin 5 MG tablet Commonly known as: COUMADIN  Take 2.5-5 mg by mouth daily. 5 mg M, W, F and 2.5 mg on T, Th, Sat and Sun        Allergies:  Allergies  Allergen Reactions   Penicillins     Other reaction(s):unknown,as a child    Family History: Family History  Problem Relation Age of Onset   Thrombosis Other    Skin cancer  Father    Kidney  disease Neg Hx    Prostate cancer Neg Hx    Kidney cancer Neg Hx    Bladder Cancer Neg Hx     Social History:  reports that he has never smoked. He has never used smokeless tobacco. He reports that he does not drink alcohol and does not use drugs.   Physical Exam: BP (!) 166/79   Pulse 70   Ht 6' 1 (1.854 m)   Wt 300 lb (136.1 kg)   BMI 39.58 kg/m   Constitutional:  Well nourished. Alert and oriented, No acute distress. HEENT: [place] AT, moist mucus membranes.  Trachea midline Cardiovascular: No clubbing, cyanosis, or edema. Respiratory: Normal respiratory effort, no increased work of breathing. Skin: I examined the Testopel  insertion site and there was no fluctuant mass, no erythema and no crepitus and no drainage when I palpated the incision site. Neurologic: Grossly intact, no focal deficits, moving all 4 extremities. Psychiatric: Normal mood and affect.   Laboratory Data: See EPIC and HPI I have reviewed the labs.    Pertinent Imaging: N/A    Assessment & Plan:    1. Indeterminate right renal mass - obtain RUS for further clarification   2. Hypogonadism  - will need to schedule next Testopel     3. BPH with LUTS -PSA up to date -continue conservative management, avoiding bladder irritants and timed voiding's -Continue tamsulosin  0.4 mg daily  4. Erectile dysfunction:    -he will continue tadalafil  20 mg daily  Return for schedule Testopel  insertion .  CLOTILDA HELON RIGGERS   [facility] [address] [place], [place] [address] [phone]

## 2024-04-03 ENCOUNTER — Encounter: Payer: Self-pay | Admitting: Urology

## 2024-04-03 ENCOUNTER — Ambulatory Visit (INDEPENDENT_AMBULATORY_CARE_PROVIDER_SITE_OTHER): Admitting: Urology

## 2024-04-03 VITALS — BP 166/79 | HR 70 | Ht 73.0 in | Wt 300.0 lb

## 2024-04-03 DIAGNOSIS — N2889 Other specified disorders of kidney and ureter: Secondary | ICD-10-CM

## 2024-04-03 DIAGNOSIS — N401 Enlarged prostate with lower urinary tract symptoms: Secondary | ICD-10-CM

## 2024-04-03 DIAGNOSIS — N529 Male erectile dysfunction, unspecified: Secondary | ICD-10-CM

## 2024-04-03 DIAGNOSIS — N138 Other obstructive and reflux uropathy: Secondary | ICD-10-CM

## 2024-04-03 DIAGNOSIS — E291 Testicular hypofunction: Secondary | ICD-10-CM

## 2024-04-04 ENCOUNTER — Other Ambulatory Visit: Payer: Self-pay

## 2024-04-04 ENCOUNTER — Encounter: Admitting: Physical Therapy

## 2024-04-09 ENCOUNTER — Encounter: Admitting: Physical Therapy

## 2024-04-11 ENCOUNTER — Encounter: Admitting: Physical Therapy

## 2024-04-13 ENCOUNTER — Ambulatory Visit
Admission: RE | Admit: 2024-04-13 | Discharge: 2024-04-13 | Disposition: A | Source: Ambulatory Visit | Attending: Urology

## 2024-04-13 DIAGNOSIS — N2889 Other specified disorders of kidney and ureter: Secondary | ICD-10-CM

## 2024-04-16 ENCOUNTER — Encounter: Admitting: Physical Therapy

## 2024-04-18 ENCOUNTER — Encounter: Admitting: Physical Therapy

## 2024-04-18 ENCOUNTER — Telehealth: Payer: Self-pay

## 2024-04-18 NOTE — Telephone Encounter (Signed)
 Pt LM on triage line-   Requesting u/s results.   LM informing pt results are not in yet. Could take up to 1 week. We will be in touch once results are in and reviewed.

## 2024-04-20 ENCOUNTER — Ambulatory Visit: Payer: Self-pay | Admitting: Urology

## 2024-04-23 ENCOUNTER — Encounter: Admitting: Physical Therapy

## 2024-04-25 ENCOUNTER — Encounter: Admitting: Physical Therapy

## 2024-05-02 ENCOUNTER — Encounter: Admitting: Physical Therapy
